# Patient Record
Sex: Male | Born: 1990 | Race: White | Hispanic: No | Marital: Single | Smoking: Never smoker
Health system: Southern US, Community
[De-identification: ages and names within clinical notes are randomized; demographics above are authoritative.]

## PROBLEM LIST (undated history)

## (undated) DIAGNOSIS — I349 Nonrheumatic mitral valve disorder, unspecified: Secondary | ICD-10-CM

## (undated) DIAGNOSIS — H539 Unspecified visual disturbance: Secondary | ICD-10-CM

## (undated) DIAGNOSIS — I341 Nonrheumatic mitral (valve) prolapse: Secondary | ICD-10-CM

## (undated) DIAGNOSIS — F909 Attention-deficit hyperactivity disorder, unspecified type: Secondary | ICD-10-CM

## (undated) DIAGNOSIS — R262 Difficulty in walking, not elsewhere classified: Secondary | ICD-10-CM

## (undated) DIAGNOSIS — F32A Depression, unspecified: Secondary | ICD-10-CM

## (undated) DIAGNOSIS — F419 Anxiety disorder, unspecified: Secondary | ICD-10-CM

## (undated) DIAGNOSIS — R200 Anesthesia of skin: Secondary | ICD-10-CM

## (undated) DIAGNOSIS — R29818 Other symptoms and signs involving the nervous system: Secondary | ICD-10-CM

## (undated) DIAGNOSIS — G459 Transient cerebral ischemic attack, unspecified: Secondary | ICD-10-CM

## (undated) HISTORY — DX: Nonrheumatic mitral valve disorder, unspecified: I34.9

## (undated) HISTORY — DX: Attention-deficit hyperactivity disorder, unspecified type: F90.9

## (undated) HISTORY — PX: TONSILLECTOMY: SUR1361

## (undated) HISTORY — DX: Unspecified visual disturbance: H53.9

## (undated) HISTORY — DX: Difficulty in walking, not elsewhere classified: R26.2

## (undated) HISTORY — DX: Anxiety disorder, unspecified: F41.9

## (undated) HISTORY — DX: Nonrheumatic mitral (valve) prolapse: I34.1

## (undated) HISTORY — DX: Depression, unspecified: F32.A

---

## 2012-11-16 DIAGNOSIS — R531 Weakness: Secondary | ICD-10-CM

## 2012-11-16 HISTORY — DX: Weakness: R53.1

## 2012-11-16 HISTORY — PX: RECONSTRUCTION, FACIAL: SHX5034

## 2017-08-05 ENCOUNTER — Emergency Department
Admission: EM | Admit: 2017-08-05 | Discharge: 2017-08-05 | Disposition: A | Discharge status: Home / Self Care | Payer: 59 | Attending: Emergency Medicine | Admitting: Emergency Medicine

## 2017-08-05 ENCOUNTER — Emergency Department: Payer: 59

## 2017-08-05 DIAGNOSIS — M7989 Other specified soft tissue disorders: Secondary | ICD-10-CM | POA: Insufficient documentation

## 2017-08-05 DIAGNOSIS — S8012XA Contusion of left lower leg, initial encounter: Secondary | ICD-10-CM

## 2017-08-05 NOTE — ED Provider Notes (Signed)
Physician/Midlevel provider first contact with patient: 08/05/17 1528         EMERGENCY DEPARTMENT HISTORY AND PHYSICAL EXAM      Patient Information     Patient Name: Kurt Rogers, Kurt Rogers  Encounter Date:  08/05/2017  Patient DOB:  [birth year]  MRN:  [id number]  Room:  EX25/EX25  Rendering Provider: Sherri Sear, PA-C    History of Presenting Illness     Chief Complaint: left leg swelling  Historian: pt  Onset: sudden  Quality: aching  Location: L shin   Duration: just PTA       HPI Comments:   26 y.o. male o/w healthy p/w left leg swelling beginning this AM. Pt states he stood up to get out of bed and he felt a pop in his L shin. He noticed an area of swelling and pain with movement of his foot. He is visiting from [place] and his PCP's nurse informed him to come to the ED. No numbness or tingling. No medications PTA. No changes in size to the hematoma t/o the day.       PMD: No primary care provider on file.    Past Medical History     History reviewed. No pertinent past medical history.    Past Surgical History     Past Surgical History:   Procedure Laterality Date   . TONSILLECTOMY         Family History     History reviewed. No pertinent family history.    Social History     Social History     Social History   . Marital status: Single     Spouse name: N/A   . Number of children: N/A   . Years of education: N/A     Social History Main Topics   . Smoking status: Never Smoker   . Smokeless tobacco: Never Used   . Alcohol use Yes      Comment: socially   . Drug use: No   . Sexual activity: Not on file     Other Topics Concern   . Not on file     Social History Narrative   . No narrative on file       Allergies     Allergies   Allergen Reactions   . Latex        Home Medications     Prior to Admission medications    Not on File         Review of Systems     Review of Systems   Constitutional: Negative for fever and chills.   HENT: Negative for sore throat.   Cardiovascular: Negative for chest pain.   Respiratory: Negative  for cough and shortness of breath.   Gastrointestinal: Negative for nausea, vomiting, diarrhea.  Musculoskeletal: + L leg pain.   Skin: + swelling .   Neuro: Negative for syncope, paresthesias.   All other systems reviewed and are negative.      Physical Exam     Patient Vitals for the past 24 hrs:   BP Temp Temp src Pulse Resp SpO2   08/05/17 1654 145/70 98.2 F (36.8 C) Oral 65 18 97 %   08/05/17 1519 148/70 98.2 F (36.8 C) Oral 79 18 97 %       Physical Exam   Nursing note and vitals reviewed.   Constitutional: Pt is oriented to person, place, and time. Pt appears well-developed and well-nourished. NAD.  HENT:     Head: Normocephalic and atraumatic.  Eyes: Conjunctivae normal and EOM are normal.   Neck: Normal range of motion.   Cardiovascular: Intact distal pulses. RRR   Pulmonary/Chest: No respiratory distress. O2 sat 97% on RA.   Musculoskeletal: LEFT LE: Left anterior shin with swelling and tender hematoma with foot in dorsiflexion. Indentation over anterior shin, just inferior to the hematoma when foot in plantarflexion with tenderness just lateral to this. Neg Homan's sign.   Neurological: Pt is alert and oriented to person, place, and time. GCS 15.   Skin: Skin is warm and dry.   Psychiatric: Normal mood and affect. Behavior is normal.       Orders Placed During This Encounter     Orders Placed This Encounter   Procedures   . XR Tibia Fibula Left AP And Lateral   . Apply ace wrap       ED Medications Administered     ED Medication Orders     None          Diagnostic Study Results and Data Review     The results of the diagnostic studies below were reviewed by the ED provider:    Labs  Results     ** No results found for the last 24 hours. **          Radiologic Studies  Radiology Results (24 Hour)     Procedure Component Value Units Date/Time    XR Tibia Fibula Left AP And Lateral [[id number]] Collected:  08/05/17 1627    Order Status:  Completed Updated:  08/05/17 1634    Narrative:       XR TIBIA  FIBULA LEFT AP AND LATERAL    CLINICAL INDICATION:   pain swelling hx of prior fibula fx    TECHNIQUE: Tibia fibula left 2 views 2 images    COMPARISON:    None available    FINDINGS:   Bone mineralization appears within normal limits. There is a  healed proximal fibula fracture. No acute fracture or subluxation is  identified. No gross abnormality is seen in the soft tissues.      Impression:           1.  No acute osseous abnormality is identified.    Tana Felts, MD   08/05/2017 4:30 PM          Abnormal results/incidental findings discussed with pt and/or family: yes    Monitors, EKG, Procedures, Critical Care, and Splints       MDM and Clinical Notes     Working Differential (not completely inclusive): sprain, hematoma, gastrocnemius strain, DVT, etc    Nursing records reviewed and agree: Yes    Clinical Notes & Decision Making:     26 yo male with swelling to the anterior shin after putting weight on the leg. NV intact. No expanding hematoma. Prior fibula fx years ago, but no significant injury today. X-Ray neg. Placed in ace wrap and advised f/u with ortho upon return to [place].   D/w Dr. Glendell Docker, who agrees with the plan of care.   Pt understands and agrees with the plan of care. Given return precautions.         Return Precautions    The patient is aware that this evaluation is only a screening for emergent conditions related to his or her symptoms and presentation.   I discussed the need for prompt follow-up.  Patient demonstrates verbal understanding.  Patient advised to return to the ED for any worsening symptoms, uncontrolled pain if  applicable, worsening fevers, or any changes in their condition prompting concern and need for repeat evaluationand/or additional management.        Prescriptions       New Prescriptions    No medications on file       Diagnosis and Disposition     Clinical Impression:  1. Leg swelling    2. Hematoma of leg, left, initial encounter      Final diagnoses:   Leg swelling    Hematoma of leg, left, initial encounter       Disposition:  ED Disposition     ED Disposition Condition Date/Time Comment    Discharge  Thu Aug 05, 2017  4:48 PM Kurt Rogers discharge to home/self care.    Condition at disposition: Stable                Rendering Provider: Sherri Sear, PA-C    Attending's signature signifies review of the provider note and clinical impression.    This note was generated by the [facility] EMR system/Dragon speech recognition and may contain inherent errors or omissions not intended by the user. Grammatical errors, random word insertions, deletions, pronoun errors and incomplete sentences are occasional consequences of this technology due to software limitations. Not all errors are caught or corrected. If there are questions or concerns about the content of this note or information contained within the body of this dictation they should be addressed directly with the author for clarification.       Sherri Sear [place], [place]  08/05/17 1705
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
## 2017-08-05 NOTE — Discharge Instructions (Signed)
Hematoma    You have been diagnosed with a hematoma.    A hematoma is a collection of blood under the skin usually caused by injury to the area. In other words, it is a very large bruise.    Put ice on the area 15 minutes out of every hour to help with swelling and pain. Putting ice on the affected area can reduce pain and swelling. Put some ice cubes in a re-sealable (Ziploc) bag. Add some water. Put a thin washcloth between the bag and the skin. Apply the ice bag to the area for at least 20 minutes. Do this at least 4 times per day. Longer times and more often are OK. NEVER APPLY ICE DIRECTLY TO THE SKIN! If your injury is on your hand or foot, lift it above the level of your heart to help with swelling.    YOU SHOULD SEEK MEDICAL ATTENTION IMMEDIATELY, EITHER HERE OR AT THE NEAREST EMERGENCY DEPARTMENT, IF ANY OF THE FOLLOWING OCCURS:   The hematoma keeps getting bigger.   Fever (temperature higher than 100.33F / 38C) or shaking chills.   Redness that surrounds or spreads outward from the area.   Breakdown of the skin in the area affected.   Very bad-smelling drainage from the injured area.     Dear Kurt Rogers,    You were seen today by Sherri Sear, PA-C. Thank you for choosing the [facility] Emergency Department for your healthcare needs.  We hope your visit today was EXCELLENT.    Return immediately to the emergency room if you have any worsening symptoms! Otherwise, see general doctor in the next 2 days, or return to the emergency department in 2 days if you have any trouble getting an appointment.    Please take any medications prescribed as directed.     If you have any questions or concerns, I am available at [phone]. Please do not hesitate to contact me if I can be of assistance.     Below is some information and resources that our patients often find helpful.    Sincerely,    Sherri Sear, Physician  Assistant    ________________________________________________________________  Thank you for choosing [facility] for your emergency care needs.  We strive to provide EXCELLENT care to you and your family.      If you do not continue to improve or your condition worsens, please contact your doctor or return immediately to the Emergency Department.    DOCTOR REFERRALS  Call [phone] (available 24 hours a day, 7 days a week) if you need any further referrals and we can help you find a primary care doctor or specialist.  Also, available online at:  [email or web address]    YOUR CONTACT INFORMATION  Before leaving please check with registration to make sure we have an up-to-date contact number.  You can call registration at [phone] to update your information.  For questions about your hospital bill, please call [phone].  For questions about your Emergency Dept Physician bill please call [phone].      FREE HEALTH SERVICES  If you need help with health or social services, please call 2-1-1 for a free referral to resources in your area.  2-1-1 is a free service connecting people with information on health insurance, free clinics, pregnancy, mental health, dental care, food assistance, housing, and substance abuse counseling.  Also, available online at:  http://www.[facility].org    MEDICAL RECORDS AND TESTS  Certain laboratory test results  do not come back the same day, for example urine cultures.  We will contact you if other important findings are noted. Radiology films are often reviewed again to ensure accuracy.  If there is any discrepancy, we will notify you.    Please call [phone] to pick up a complimentary CD of any radiology studies performed.  If you or your doctor would like to request a copy of your medical records, please call [phone].      ORTHOPEDIC INJURY   Please know that significant injuries can exist even when an initial x-ray is  read as normal or negative.  This can occur because some fractures (broken bones) are not initially visible on x-rays.  For this reason, close outpatient follow-up with your primary care doctor or bone specialist (orthopedist) is required.    MEDICATIONS AND FOLLOWUP  Please be aware that some prescription medications can cause drowsiness.  Use caution when driving or operating machinery.    The examination and treatment you have received in our Emergency Department is provided on an emergency basis, and is not intended to be a substitute for your primary care physician.  It is important that your doctor checks you again and that you report any new or remaining problems at that time.      24 HOUR PHARMACIES  [facility] - [address] [place], [place] [address] (1.4 miles, 7 minutes)  [facility] - [address], [place], [place] [address] (6.5 miles, 13 minutes)  Handout with directions available on request.

## 2021-07-06 ENCOUNTER — Emergency Department
Admission: EM | Admit: 2021-07-06 | Discharge: 2021-07-06 | Disposition: A | Discharge status: Home / Self Care | Payer: Commercial Managed Care - POS | Attending: Student in an Organized Health Care Education/Training Program | Admitting: Student in an Organized Health Care Education/Training Program

## 2021-07-06 ENCOUNTER — Emergency Department: Payer: Commercial Managed Care - POS

## 2021-07-06 DIAGNOSIS — H538 Other visual disturbances: Secondary | ICD-10-CM | POA: Insufficient documentation

## 2021-07-06 LAB — CBC AND DIFFERENTIAL
Absolute NRBC: 0 10*3/uL (ref 0.00–0.00)
Basophils Absolute Automated: 0.04 10*3/uL (ref 0.00–0.08)
Basophils Automated: 0.5 %
Eosinophils Absolute Automated: 0.11 10*3/uL (ref 0.00–0.44)
Eosinophils Automated: 1.4 %
Hematocrit: 42.9 % (ref 37.6–49.6)
Hgb: 15.1 g/dL (ref 12.5–17.1)
Immature Granulocytes Absolute: 0.01 10*3/uL (ref 0.00–0.07)
Immature Granulocytes: 0.1 %
Lymphocytes Absolute Automated: 3.2 10*3/uL (ref 0.42–3.22)
Lymphocytes Automated: 40.2 %
MCH: 27.4 pg (ref 25.1–33.5)
MCHC: 35.2 g/dL (ref 31.5–35.8)
MCV: 77.9 fL — ABNORMAL LOW (ref 78.0–96.0)
MPV: 9.6 fL (ref 8.9–12.5)
Monocytes Absolute Automated: 0.49 10*3/uL (ref 0.21–0.85)
Monocytes: 6.2 %
Neutrophils Absolute: 4.11 10*3/uL (ref 1.10–6.33)
Neutrophils: 51.6 %
Nucleated RBC: 0 /100 WBC (ref 0.0–0.0)
Platelets: 339 10*3/uL (ref 142–346)
RBC: 5.51 10*6/uL (ref 4.20–5.90)
RDW: 12 % (ref 11–15)
WBC: 7.96 10*3/uL (ref 3.10–9.50)

## 2021-07-06 LAB — COMPREHENSIVE METABOLIC PANEL
ALT: 34 U/L (ref 0–55)
AST (SGOT): 17 U/L (ref 5–34)
Albumin/Globulin Ratio: 1.8 (ref 0.9–2.2)
Albumin: 4.5 g/dL (ref 3.5–5.0)
Alkaline Phosphatase: 78 U/L (ref 37–117)
Anion Gap: 9 (ref 5.0–15.0)
BUN: 12 mg/dL (ref 9.0–28.0)
Bilirubin, Total: 0.4 mg/dL (ref 0.2–1.2)
CO2: 27 mEq/L (ref 22–29)
Calcium: 9.9 mg/dL (ref 8.5–10.5)
Chloride: 107 mEq/L (ref 100–111)
Creatinine: 0.8 mg/dL (ref 0.7–1.3)
Globulin: 2.5 g/dL (ref 2.0–3.6)
Glucose: 92 mg/dL (ref 70–100)
Potassium: 4.3 mEq/L (ref 3.5–5.1)
Protein, Total: 7 g/dL (ref 6.0–8.3)
Sodium: 143 mEq/L (ref 136–145)

## 2021-07-06 LAB — ECG 12-LEAD
Atrial Rate: 67 {beats}/min
IHS MUSE NARRATIVE AND IMPRESSION: NORMAL
P Axis: 52 degrees
P-R Interval: 160 ms
Q-T Interval: 370 ms
QRS Duration: 88 ms
QTC Calculation (Bezet): 390 ms
R Axis: 120 degrees
T Axis: -3 degrees
Ventricular Rate: 67 {beats}/min

## 2021-07-06 LAB — TROPONIN I: Troponin I: <0.01 ng/mL (ref 0.00–0.05)

## 2021-07-06 LAB — GLUCOSE WHOLE BLOOD - POCT: Whole Blood Glucose POCT: 88 mg/dL (ref 70–100)

## 2021-07-06 LAB — GFR: EGFR: >60

## 2021-07-06 NOTE — ED Triage Notes (Signed)
PT c/o blurred vision. Per PT, he woke up this morning with blood on his pillow - unknown source of blood and blurry vision more than normal. PT reports he was seen at [facility] for r/o stroke Tuesday and they sent him home; PT states he has had numbness and tingling on left arms and leg since Tuesday.    BP 140/79   Pulse 92   Temp 98.2 F (36.8 C) (Oral)   Resp 16   Ht 5\' 10"  (1.778 m)   Wt 95.1 kg   SpO2 98%   BMI 30.07 kg/m

## 2021-07-06 NOTE — ED Notes (Signed)
RN first contact with pt - pt is showing no signs of acute distress

## 2021-07-06 NOTE — ED Provider Notes (Signed)
EMERGENCY DEPARTMENT HISTORY AND PHYSICAL EXAM     None        Date: 07/06/2021  Patient Name: Kurt Rogers    This note was generated by the [employer] EMR system/ Dragon speech recognition and may contain inherent errors or omissions not intended by the user. Grammatical errors, random word insertions, deletions and pronoun errors  are occasional consequences of this technology due to software limitations. Not all errors are caught or corrected. If there are questions or concerns about the content of this note or information contained within the body of this dictation they should be addressed directly with the author for clarification.    History of Presenting Illness     Chief Complaint   Patient presents with    Blurred Vision       History Provided By: Patient    Additional History: Kurt Rogers is a 30 y.o. male presenting with blurry vision and waking up to small amount of dried blood on his pillow this morning.  He reports he was seen at outside hospital 4 days ago for numbness and tingling of his left side; received a full stroke work-up which was negative.  However he claims that he left his prescription glasses at the hospital and since then he has been having worsening blurry vision.  Reports he has ordered a new set of prescription glasses.  States his main concern is waking up with a dry blood on his pillow and not knowing where it came from.  Denies any wounds, nosebleeds, new numbness, weakness, headache, chest pain, dizziness or other acute neurological complaints.    I reviewed patient's last ED visit, clinic visit or admission/discharge summary, as well as associated recent EKGs, lab or imaging results, if applicable.     PCP: No primary care provider on file.    Past History   Past Medical History:  History reviewed. No pertinent past medical history.    Past Surgical History:  Past Surgical History:   Procedure Laterality Date    TONSILLECTOMY         Family History:  No family history on  file.    Social History:  Social History     Tobacco Use    Smoking status: Never    Smokeless tobacco: Never   Substance Use Topics    Alcohol use: Yes     Comment: socially    Drug use: No       Allergies:  Allergies   Allergen Reactions    Latex        Review of Systems     Review of Systems   Constitutional:  Negative for fever.   HENT:  Negative for drooling and nosebleeds.    Eyes:  Positive for visual disturbance.   Respiratory:  Negative for shortness of breath.    Cardiovascular:  Negative for chest pain.   Gastrointestinal:  Negative for abdominal pain.   Genitourinary:  Negative for dysuria.   Musculoskeletal:  Negative for back pain.   Skin:  Negative for rash and wound.   Neurological:  Negative for dizziness, weakness and headaches.     Other than pertinent positives as above, a complete ROS was reviewed and negative.    Physical Exam   BP 140/79   Pulse 92   Temp 98.2 F (36.8 C) (Oral)   Resp 16   Ht 5\' 10"  (1.778 m)   Wt 95.1 kg   SpO2 98%   BMI 30.07 kg/m  Physical Exam  Vitals reviewed.   Constitutional:       Appearance: He is not toxic-appearing.   HENT:      Head: Normocephalic and atraumatic.      Right Ear: Tympanic membrane and ear canal normal.      Left Ear: Tympanic membrane and ear canal normal.      Nose: Nose normal.   Eyes:      General:         Right eye: No discharge.         Left eye: No discharge.      Extraocular Movements: Extraocular movements intact.      Conjunctiva/sclera: Conjunctivae normal.      Pupils: Pupils are equal, round, and reactive to light.   Cardiovascular:      Rate and Rhythm: Normal rate.      Pulses: Normal pulses.   Pulmonary:      Effort: Pulmonary effort is normal. No respiratory distress.   Abdominal:      General: There is no distension.   Musculoskeletal:         General: No deformity. Normal range of motion.      Cervical back: Normal range of motion and neck supple.   Skin:     General: Skin is warm and dry.   Neurological:      Mental  Status: He is alert. Mental status is at baseline.   Psychiatric:         Mood and Affect: Mood normal.         Diagnostic Study Results     Labs -  Reviewed, if relevant to the case.    Radiologic Studies -   Reviewed, if relevant to the case.      Medical Decision Making   I am the first provider for this patient.    I reviewed the vital signs, available nursing notes, past medical history, past surgical history, family history and social history.    Vital Signs: Reviewed the patient's vital signs during ED stay.    I reviewed pt's pulse oxymetry and cardiac monitor values, as relevant to the case.    Pulse Oximetry Analysis:  98% on RA - Normal    Cardiac Monitor:  Rhythm:  Normal Sinus, Rate:  Normal, Ectopy:  None    Personal Protective Equipment (PPE)  Gloves, N95 and surgical mask.    Record Review: The following, if applicable to the case, were reviewed and noted:    Old medical records.  Nursing notes.  Outside records.     EKG:  Interpreted by this Emergency Physician.  EKG: NSR 67, no STE's or STD's, non-specific twave pattern, no STEMI, QTC 390    Provider Notes/MDM:   Patient is a 30 y.o. male who presented to the ED with blurry vision in the setting of losing his prescription eyeglasses as well as concern for dry blood noted on his pillow upon waking up this morning.  On presentation, pt is nontoxic appearing, HDS, VSS.  There is no obvious external signs of trauma or wounds to his head and neck.  TMs are clear, nares without dried blood.  Unsure where the dried blood came from; could ppossibly be from a scalp wound or scratching in the middle of his sleep.  Blood from triage shows a normal H&H and other lab work is unremarkable.  His blurry vision, likely due to the fact that he has not been wearing his prescription glasses.  Patient reports he  has called his optometrist and has ordered a new prescription glasses.  He also states that he has low concern about the blurry vision that is not his main  complaint today.  I reassured patient that work-up and physical exam is unremarkable.  He is stable for discharge with PCP follow-up for his neurology follow-up and can return for any concerning symptoms.     ED Course:         Diagnosis     Clinical Impression:   1. Blurry vision, bilateral        Disposition:   ED Disposition       ED Disposition   Discharge    Condition   --    Date/Time   Sun Jul 06, 2021  2:06 PM    Comment   Kurt Rogers discharge to home/self care.    Condition at disposition: Stable                 The above diagnostic process was due to medical necessity based on risk stratification of potential harm of patient's presenting complaint.     Attestations: This note is prepared by Aline Brochure, MD. I am the first provider for this patient.        Verna Czech, MD  07/06/21 2242

## 2021-07-06 NOTE — Discharge Instructions (Addendum)
Follow up with your optometrist for your new glasses prescription. Do not drive without your prescription glasses.    Return to the Emergency Department immediately if you experience worsening symptoms, failure to improve, or have any questions, or new or concerning symptoms such as chest pain, shortness of breath, fever (Temp > 100.5), persistent vomiting, infection, weakness, numbness, headache, facial droop, or slurred speech. Otherwise follow-up with your primary care doctor.    No future appointments.    Call your primary care physician to make an appointment as a follow up to your ED visit.     Take all your medications exactly as prescribed. If you are taking or were prescribed a sedating medication such as valium, norco, percocet or other opioids, do not drive or operate heavy machinery while taking. Prolonged or overuse of drugs prescribed for pain or sedation may lead to addiction, dependence, as well as liver failure.

## 2021-12-02 ENCOUNTER — Observation Stay
Admission: EM | Admit: 2021-12-02 | Discharge: 2021-12-04 | Disposition: A | Discharge status: Home / Self Care | Payer: Self-pay | Attending: Internal Medicine | Admitting: Internal Medicine

## 2021-12-02 ENCOUNTER — Emergency Department: Payer: Self-pay

## 2021-12-02 DIAGNOSIS — R0789 Other chest pain: Principal | ICD-10-CM | POA: Insufficient documentation

## 2021-12-02 DIAGNOSIS — R531 Weakness: Secondary | ICD-10-CM | POA: Insufficient documentation

## 2021-12-02 DIAGNOSIS — I498 Other specified cardiac arrhythmias: Secondary | ICD-10-CM | POA: Insufficient documentation

## 2021-12-02 DIAGNOSIS — F909 Attention-deficit hyperactivity disorder, unspecified type: Secondary | ICD-10-CM | POA: Insufficient documentation

## 2021-12-02 DIAGNOSIS — E669 Obesity, unspecified: Secondary | ICD-10-CM | POA: Insufficient documentation

## 2021-12-02 DIAGNOSIS — Z6831 Body mass index (BMI) 31.0-31.9, adult: Secondary | ICD-10-CM | POA: Insufficient documentation

## 2021-12-02 DIAGNOSIS — R079 Chest pain, unspecified: Secondary | ICD-10-CM

## 2021-12-02 DIAGNOSIS — F449 Dissociative and conversion disorder, unspecified: Secondary | ICD-10-CM | POA: Insufficient documentation

## 2021-12-02 DIAGNOSIS — Z7982 Long term (current) use of aspirin: Secondary | ICD-10-CM | POA: Insufficient documentation

## 2021-12-02 DIAGNOSIS — R55 Syncope and collapse: Secondary | ICD-10-CM | POA: Insufficient documentation

## 2021-12-02 DIAGNOSIS — Z20822 Contact with and (suspected) exposure to covid-19: Secondary | ICD-10-CM | POA: Insufficient documentation

## 2021-12-02 DIAGNOSIS — Z79899 Other long term (current) drug therapy: Secondary | ICD-10-CM | POA: Insufficient documentation

## 2021-12-02 HISTORY — DX: Other symptoms and signs involving the nervous system: R29.818

## 2021-12-02 HISTORY — DX: Anesthesia of skin: R20.0

## 2021-12-02 HISTORY — DX: Transient cerebral ischemic attack, unspecified: G45.9

## 2021-12-02 LAB — CBC AND DIFFERENTIAL
Absolute NRBC: 0 10*3/uL (ref 0.00–0.00)
Basophils Absolute Automated: 0.07 10*3/uL (ref 0.00–0.08)
Basophils Automated: 0.8 %
Eosinophils Absolute Automated: 0.09 10*3/uL (ref 0.00–0.44)
Eosinophils Automated: 1.1 %
Hematocrit: 45.4 % (ref 37.6–49.6)
Hgb: 16 g/dL (ref 12.5–17.1)
Immature Granulocytes Absolute: 0.01 10*3/uL (ref 0.00–0.07)
Immature Granulocytes: 0.1 %
Instrument Absolute Neutrophil Count: 3.87 10*3/uL (ref 1.10–6.33)
Lymphocytes Absolute Automated: 3.83 10*3/uL — ABNORMAL HIGH (ref 0.42–3.22)
Lymphocytes Automated: 45.4 %
MCH: 27.3 pg (ref 25.1–33.5)
MCHC: 35.2 g/dL (ref 31.5–35.8)
MCV: 77.5 fL — ABNORMAL LOW (ref 78.0–96.0)
MPV: 9.4 fL (ref 8.9–12.5)
Monocytes Absolute Automated: 0.57 10*3/uL (ref 0.21–0.85)
Monocytes: 6.8 %
Neutrophils Absolute: 3.87 10*3/uL (ref 1.10–6.33)
Neutrophils: 45.8 %
Nucleated RBC: 0 /100 WBC (ref 0.0–0.0)
Platelets: 339 10*3/uL (ref 142–346)
RBC: 5.86 10*6/uL (ref 4.20–5.90)
RDW: 12 % (ref 11–15)
WBC: 8.44 10*3/uL (ref 3.10–9.50)

## 2021-12-02 LAB — COMPREHENSIVE METABOLIC PANEL
ALT: 26 U/L (ref 0–55)
AST (SGOT): 20 U/L (ref 5–41)
Albumin/Globulin Ratio: 1.8 (ref 0.9–2.2)
Albumin: 4.9 g/dL (ref 3.5–5.0)
Alkaline Phosphatase: 75 U/L (ref 37–117)
Anion Gap: 13 (ref 5.0–15.0)
BUN: 13 mg/dL (ref 9.0–28.0)
Bilirubin, Total: 0.5 mg/dL (ref 0.2–1.2)
CO2: 24 mEq/L (ref 17–29)
Calcium: 9.7 mg/dL (ref 8.5–10.5)
Chloride: 105 mEq/L (ref 99–111)
Creatinine: 1 mg/dL (ref 0.5–1.5)
Globulin: 2.7 g/dL (ref 2.0–3.6)
Glucose: 92 mg/dL (ref 70–100)
Potassium: 4.3 mEq/L (ref 3.5–5.3)
Protein, Total: 7.6 g/dL (ref 6.0–8.3)
Sodium: 142 mEq/L (ref 135–145)

## 2021-12-02 LAB — COVID-19 (SARS-COV-2) & INFLUENZA  A/B, NAA (ROCHE LIAT)
Influenza A: NOT DETECTED
Influenza B: NOT DETECTED
SARS CoV 2 Overall Result: NOT DETECTED

## 2021-12-02 LAB — GLUCOSE WHOLE BLOOD - POCT: Whole Blood Glucose POCT: 92 mg/dL (ref 70–100)

## 2021-12-02 LAB — GFR: EGFR: >60

## 2021-12-02 LAB — HIGH SENSITIVITY TROPONIN-I: hs Troponin-I: <2.7 ng/L

## 2021-12-02 LAB — HIGH SENSITIVITY TROPONIN-I WITH DELTA
hs Troponin-I Delta: UNDETERMINED ng/L
hs Troponin-I: <2.7 ng/L

## 2021-12-02 MED ORDER — HYDRALAZINE HCL 20 MG/ML IJ SOLN
10.0000 mg | INTRAMUSCULAR | Status: DC | PRN
Start: 2021-12-02 — End: 2021-12-04

## 2021-12-02 MED ORDER — AMPHETAMINE-DEXTROAMPHETAMINE 5 MG PO TABS
10.0000 mg | ORAL_TABLET | Freq: Two times a day (BID) | ORAL | Status: DC
Start: 2021-12-03 — End: 2021-12-04
  Administered 2021-12-03 – 2021-12-04 (×3): 10 mg via ORAL
  Filled 2021-12-02 (×4): qty 2

## 2021-12-02 MED ORDER — HEPARIN SODIUM (PORCINE) 5000 UNIT/ML IJ SOLN
5000.0000 [IU] | Freq: Two times a day (BID) | INTRAMUSCULAR | Status: DC
Start: 2021-12-02 — End: 2021-12-04
  Administered 2021-12-02 – 2021-12-04 (×4): 5000 [IU] via SUBCUTANEOUS
  Filled 2021-12-02 (×4): qty 1

## 2021-12-02 MED ORDER — ASPIRIN 81 MG PO CHEW
81.0000 mg | CHEWABLE_TABLET | Freq: Every day | ORAL | Status: DC
Start: 2021-12-02 — End: 2021-12-04
  Administered 2021-12-03 – 2021-12-04 (×2): 81 mg via ORAL
  Filled 2021-12-02 (×2): qty 1

## 2021-12-02 MED ORDER — ATORVASTATIN CALCIUM 10 MG PO TABS
10.0000 mg | ORAL_TABLET | Freq: Every evening | ORAL | Status: DC
Start: 2021-12-02 — End: 2021-12-04
  Administered 2021-12-02 – 2021-12-03 (×2): 10 mg via ORAL
  Filled 2021-12-02 (×2): qty 1

## 2021-12-02 MED ORDER — ASPIRIN 81 MG PO CHEW
324.0000 mg | CHEWABLE_TABLET | Freq: Once | ORAL | Status: AC
Start: 2021-12-02 — End: 2021-12-02
  Administered 2021-12-02: 324 mg via ORAL
  Filled 2021-12-02: qty 4

## 2021-12-02 MED ORDER — IOHEXOL 350 MG/ML IV SOLN
50.0000 mL | Freq: Once | INTRAVENOUS | Status: AC | PRN
Start: 2021-12-02 — End: 2021-12-02
  Administered 2021-12-02: 80 mL via INTRAVENOUS

## 2021-12-02 MED ORDER — IOHEXOL 350 MG/ML IV SOLN
45.0000 mL | Freq: Once | INTRAVENOUS | Status: AC | PRN
Start: 2021-12-02 — End: 2021-12-02
  Administered 2021-12-02: 45 mL via INTRAVENOUS

## 2021-12-02 MED ORDER — LABETALOL HCL 5 MG/ML IV SOLN (WRAP)
10.0000 mg | INTRAVENOUS | Status: DC | PRN
Start: 2021-12-02 — End: 2021-12-04

## 2021-12-02 NOTE — Consults (Signed)
[facility] Consultation Note                                       Date Time: 12/02/21 2:53 PM  Patient Name: Kurt Rogers,Kurt Rogers  Requesting Physician: Maryella ShiversKumar, Vinod S, MD  Date of Admission: 12/02/2021    CC / Reason for Consultation: stroke alert          Assessment:   31 y.o. M w/ hx of ADHD who presents with chest pain and worsening L sided weakness after syncopal event. LKW 0830 am.  NIHSS 3. Exam w/ LUE and LLE weakness and decreased sensation.  CT head nothing acute, CTA head/neck no LVO, CTP wnl. Not a tPA candidate, > 4.5 hrs since LKW and symptoms appears chronic. No indication for MET.      Etiology of chronic L sided weakness unclear. Defer MRI at this time as pt has had multiple negative MRIs over the past year for this ongoing L sided weakness. Recommend follow up with outpatient Neurologist.     Plan:   -Defer MRI brain   -Normotensive BP goal  -[facility]  -Okay to discharge from neuro standpoint   -Chest pain workup per ED     HPI   Kurt Rogers is a 31 y.o. male w/ hx of ADHD who presents with chest pain and worsening L sided weakness after a syncopal event.  States at 0830 this morning he was on his computer when he then woke up on the floor at 1300 pm w/ chest pain and worsening L sided weakness. He has no recollection of falling on the ground. He called his friend who drove him to the ED.  Denies headache, vision changes, speech changes, dizziness, nausea, fevers, SOB.  Denies any recent stressors in life.      Pt reports hx of L sided weakness since 2021. He has [facility] [place], [facility]. 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 MRI C spines (02/22, 04/22, 08/22) all unremarkable studies.  LP completed in 04/22 w/ negative IgG and OCB. 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  Currently follows outpatient with [facility] Neurologist, Dr. Florestine AversAbu-Rub.     Past Medical Hx   No past medical history on file.     Past Surgical Hx:     Past Surgical History:   Procedure Laterality Date    TONSILLECTOMY          Family Medical History:    No family history on file.    Social Hx     Social History     Socioeconomic History    Marital status: Single   Tobacco Use    Smoking status: Never    Smokeless tobacco: Never   Substance and Sexual Activity    Alcohol use: Yes     Comment: socially    Drug use: No       Meds     Home :   Prior to Admission medications  Medication Sig Start Date End Date Taking? Authorizing Provider   amphetamine-dextroamphetamine (Adderall XR) 20 MG 24 hr capsule Adderall XR 20 mg capsule,extended release   TAKE 1 CAPSULE BY MOUTH EVERY DAY IN THE MORNING    [provider]      Inpatient :       Allergies    Latex      Review of Systems   Pertinent items are noted in HPI.  All other systems were reviewed and are negative except for that mentioned in the HPI    Physical Exam:   Temp:  [97.8 F (36.6 C)] 97.8 F (36.6 C)  Heart Rate:  [63] 63  Resp Rate:  [18] 18  BP: (137)/(89) 137/89     Vital Signs:  Reviewed    General: Well developed and well nourished. No acute distress. Cooperative with the exam  Neck: Symmetric, no deformities  Resp: No audible wheezing, normal work of breathing  Skin: Intact, extremities normal in color    Mental Status: The patient is awake, alert and oriented to person, place, and time. Speech fluent without word finding difficulties. Naming intact, repetition intact. Follows commands.     Cranial nerves:   -CN II: Visual fields full to bedside confrontation   -CN III, IV, VI: Pupils equal, round,  and reactive to light; extraocular movements intact; no ptosis  -CN V: Facial sensation intact in V1 through V3 distributions   -CN VII: Face symmetric   -CN VIII: Hearing intact to conversational speech   -CN IX, X: Palate elevates symmetrically; normal phonation   -CN XII: Tongue protrudes midline    Motor: Muscle tone normal without spasticity or flaccidity.  UEs:   Deltoid Bicep Tricep Grip   Right 5 5 5 5    Left 4 4 4 4      LEs   HF KF KE DF   Right 5 5 5 5    Left 4 4 4 4      Sensory: Light touch intact diminished to LUE/LLE. No extinction  Coordination: FTN intact, No tremors  Gait: Station normal, gait stable   Tandem walk intact.   Romberg negative.    Results       Procedure Component Value Units Date/Time    Glucose Whole Blood - POCT Collected: 12/02/21 1429     Updated: 12/02/21 1431     Whole Blood Glucose POCT 92 mg/dL           Rads:   No results found for this or any previous visit.    Signed by:  [[id number]], NP-C  Nurse Practitioner  [facility]  Spectra: IAH [phone]     *Final recommendations per attending neurologist who will also see patient today and record notes.

## 2021-12-02 NOTE — ED Notes (Signed)
[facility] EMERGENCY DEPARTMENT  ED NURSING NOTE FOR THE RECEIVING INPATIENT NURSE   ED NURSE Eloise Levels [phone]   ED CHARGE RN Jingle   ADMISSION INFORMATION   Kurt Rogers is a 31 y.o. male admitted with an ED diagnosis of:    1. Left-sided weakness    2. Chest pain, unspecified type         Isolation: None   Allergies: Latex   Holding Orders confirmed? No   Belongings Documented? Yes   Home medications sent to pharmacy confirmed? N/A   NURSING CARE   Patient Comes From:   Mental Status: Home Independent  alert and oriented   ADL: Independent with all ADLs   Ambulation: no difficulty   Pertinent Information  and Safety Concerns:       left weakness, hx of same, has had multiple MRI's within last year, no definitive diagnosis. Mild weakness left side grasp.      CT / NIH   CT Head ordered on this patient?  Yes   NIH/Dysphagia assessment done prior to admission? Yes   VITAL SIGNS (at the time of this note)      Vitals:    12/02/21 1832   BP: 110/54   Pulse: (!) 57   Resp: 18   Temp: 97.8 F (36.6 C)   SpO2: 97%

## 2021-12-02 NOTE — ED Provider Notes (Signed)
EMERGENCY DEPARTMENT HISTORY AND PHYSICAL EXAM     None        Date: 12/02/2021  Patient Name: Kurt Rogers    History of Presenting Illness     Chief Complaint   Patient presents with    Chest Pain    Syncope       History Provided By: patient    History: Kurt Rogers is a 31 y.o. male presenting to the ED with moderate severity, sudden onset, generalized episode of syncope that occurred today morning while patient was at home.  Patient states he woke up at around 1:30 PM today with midline pressure nonradiating chest pain that is spontaneously improving and weakness and numbness in the left arm.  No clear exacerbating relieving factors symptoms.  No illicit drug use.  Patient has been admitted to the hospital with left-sided weakness multiple times in 2022 with unremarkable MRI.    PCP: Gracelyn Nurse, MD  SPECIALISTS:    Current Facility-Administered Medications   Medication Dose Route Frequency Provider Last Rate Last Admin    [START ON 12/03/2021] amphetamine-dextroamphetamine (ADDERALL) tablet 10 mg  10 mg Oral 2XDAY at 0800 & 1200 Iris Pert, MD        aspirin chewable tablet 81 mg  81 mg Oral Daily Iris Pert, MD        atorvastatin (LIPITOR) tablet 10 mg  10 mg Oral QHS Iris Pert, MD   10 mg at 12/02/21 2147    heparin (porcine) injection 5,000 Units  5,000 Units Subcutaneous Q12H [facility] Iris Pert, MD   5,000 Units at 12/02/21 2148    hydrALAZINE (APRESOLINE) injection 10 mg  10 mg Intravenous Q3H PRN Iris Pert, MD        labetalol (NORMODYNE,TRANDATE) injection 10 mg  10 mg Intravenous Q15 Min PRN Iris Pert, MD           Past History     Past Medical History:  Past Medical History:   Diagnosis Date    Neurological abnormality     Numbness     TIA (transient ischemic attack)        Past Surgical History:  Past Surgical History:   Procedure Laterality Date    TONSILLECTOMY         Family History:  History reviewed. No pertinent family  history.    Social History:  Social History     Tobacco Use    Smoking status: Never    Smokeless tobacco: Never   Substance Use Topics    Alcohol use: Yes     Comment: socially    Drug use: No       Allergies:  Allergies   Allergen Reactions    Latex        Review of Systems     Review of Systems: All other systems reviewed and negative.         Physical Exam   BP 112/71   Pulse (!) 52   Temp 97.8 F (36.6 C) (Oral)   Resp 18   Ht 5\' 10"  (1.778 m)   Wt 99.7 kg   SpO2 96%   BMI 31.54 kg/m     Constitutional: Vital signs reviewed. Well appearing. No distress.  Head: Normocephalic, atraumatic  Eyes: Conjunctiva and sclera are normal.  No injection or discharge.  Ears, Nose, Throat:  Normal external examination of the nose and ears. No throat or oropharyngeal swelling or erythema. Midline uvula. Mucous membranes  moist.  Neck: Normal range of motion. Supple, no meningeal signs. Trachea midline. No stridor. No JVD  Respiratory/Chest: Clear to auscultation. No respiratory distress.   Cardiovascular: Regular rate and rhythm. No murmurs.  Abdomen:  Bowel sounds intact. No rebound or guarding. Soft.  Non-tender.  Back: No CVA tenderness to percussion. No focal tenderness.  Upper Extremity:  No edema. No cyanosis. Bilateral radial pulses intact and equal.   Lower Extremity:  No edema. No cyanosis. Bilateral calves symmetrical and non-tender. Bilateral femoral, DP, PT pulses intact and equal.  Skin: Warm and dry. No rash.  Neuro: A&Ox3. CNII -XII intact to testing.  Strength in the left arm and left leg mildly decreased compared to strength in the right arm and right leg.  Sensation to sharp touch mildly decreased in the left arm and left leg compared to right arm and right leg.  Coordination intact to finger to nose testing and heel to shin rubbing.    Psychiatric: Normal affect.  Normal insight.      Diagnostic Study Results     Labs -     Results       Procedure Component Value Units Date/Time    High Sensitivity  Troponin-I at 2 hrs with calculated Delta [[id number]] Collected: 12/02/21 1709    Specimen: Blood Updated: 12/02/21 1744     hs Troponin-I <2.7 ng/L      hs Troponin-I Delta Unable toCalc. ng/L     COVID-19 (SARS-CoV-2) and Influenza A/B, NAA (Liat Rapid)- Admission [[id number]] Collected: 12/02/21 1502    Specimen: Culturette from Nasopharyngeal Updated: 12/02/21 1545     Purpose of COVID testing Diagnostic -PUI     SARS-CoV-2 Specimen Source Nasal Swab     SARS CoV 2 Overall Result Not Detected     Influenza A Not Detected     Influenza B Not Detected    Narrative:      o Collect and clearly label specimen type:  o PREFERRED-Upper respiratory specimen: One Nasal Swab in  Transport Media.  o Hand deliver to laboratory ASAP  Diagnostic -PUI    High Sensitivity Troponin-I at 0 hrs [[id number]] Collected: 12/02/21 1502    Specimen: Blood Updated: 12/02/21 1544     hs Troponin-I <2.7 ng/L     Comprehensive metabolic panel [[id number]] Collected: 12/02/21 1502    Specimen: Blood Updated: 12/02/21 1536     Glucose 92 mg/dL      BUN 27.2 mg/dL      Creatinine 1.0 mg/dL      Sodium 536 mEq/L      Potassium 4.3 mEq/L      Chloride 105 mEq/L      CO2 24 mEq/L      Calcium 9.7 mg/dL      Protein, Total 7.6 g/dL      Albumin 4.9 g/dL      AST (SGOT) 20 U/L      ALT 26 U/L      Alkaline Phosphatase 75 U/L      Bilirubin, Total 0.5 mg/dL      Globulin 2.7 g/dL      Albumin/Globulin Ratio 1.8     Anion Gap 13.0    GFR [[id number]] Collected: 12/02/21 1502     Updated: 12/02/21 1536     EGFR >60.0       CBC and differential [[id number]]  (Abnormal) Collected: 12/02/21 1502    Specimen: Blood Updated: 12/02/21 1519     WBC 8.44 x10 3/uL  Hgb 16.0 g/dL      Hematocrit 16.1 %      Platelets 339 x10 3/uL      RBC 5.86 x10 6/uL      MCV 77.5 fL      MCH 27.3 pg      MCHC 35.2 g/dL      RDW 12 %      MPV 9.4 fL      Instrument Absolute Neutrophil Count 3.87 x10 3/uL      Neutrophils 45.8 %      Lymphocytes Automated 45.4 %      Monocytes  6.8 %      Eosinophils Automated 1.1 %      Basophils Automated 0.8 %      Immature Granulocytes 0.1 %      Nucleated RBC 0.0 /100 WBC      Neutrophils Absolute 3.87 x10 3/uL      Lymphocytes Absolute Automated 3.83 x10 3/uL      Monocytes Absolute Automated 0.57 x10 3/uL      Eosinophils Absolute Automated 0.09 x10 3/uL      Basophils Absolute Automated 0.07 x10 3/uL      Immature Granulocytes Absolute 0.01 x10 3/uL      Absolute NRBC 0.00 x10 3/uL     Glucose Whole Blood - POCT [[id number]] Collected: 12/02/21 1429     Updated: 12/02/21 1431     Whole Blood Glucose POCT 92 mg/dL             Radiologic Studies -   Radiology Results (24 Hour)       Procedure Component Value Units Date/Time    Chest AP Portable [[id number]] Collected: 12/02/21 1532    Order Status: Completed Updated: 12/02/21 1535    Narrative:      INDICATION: syncope cp    TECHNIQUE: Portable AP radiograph of the chest.    COMPARISON: Chest radiograph dated 07/06/2021.    FINDINGS: Cardiomediastinal silhouette is normal in size and  appearance.  Lungs are clear. Pleural spaces are clear. No pneumothorax.   Upper abdomen and regional osseous structures demonstrate no acute  abnormality.      Impression:          No acute thoracic process.     Aldean Ast, MD   12/02/2021 3:33 PM    CT Angiogram Head Neck [[id number]] Collected: 12/02/21 1509    Order Status: Completed Updated: 12/02/21 1529    Narrative:      CTA HEAD AND NECK    HISTORY: Left-sided weakness.    COMPARISON: Same day noncontrast head CT and CT perfusion study.    TECHNIQUE: Axial CT images were obtained from the skull vertex to the  superior thoracic aperture in early arterial phase after intravenous  administration of 45 mL Omnipaque 350 iodinated contrast. Reformatted  images in the coronal and sagittal planes were generated. 3-D images  were generated and reviewed on a separate workstation. CT scanning at  this site utilizes multiple dose reduction techniques, including  automatic  exposure control, adjustment of the mA and/or kV according to  patient size, and iterative reconstruction technique. Carotid stenoses  are estimated using NASCET criteria. Viz postprocessing software was  used to assess for large vessel occlusion.      FINDINGS:    NECK:    Aortic arch: Patent. Normal caliber.    Right carotid artery: No occlusion or significant (50% or greater)  stenosis.    Left carotid artery: No occlusion or significant (  50% or greater)  stenosis.    Vertebral arteries: Codominant. No occlusion or high-grade stenosis.  Mild luminal contour irregularity of the distal V2 segments largely  corresponds with areas of streak on coronal and sagittal reformatted  images and is, therefore, favored artifactual.      HEAD:    Minimal diffuse arterial contour irregularity without significant focal  beading or narrowing, favored artifactual.    Anterior circulation: No occlusion or high-grade stenosis. No aneurysm  identified.    Posterior circulation: No occlusion or high-grade stenosis. Duplicated  superior cerebellar arteries. No aneurysm identified.    Dural venous sinuses: Patent.        Impression:        1.  No large vessel intracranial arterial occlusion or high-grade  stenosis.  2.  No intracranial aneurysm identified.  3.  No occlusion or significant stenosis of the cervical carotid or  vertebral arteries.    Christy Gentles MD, MD   12/02/2021 3:27 PM    CT Angiogram Cerebral Perfusion W 3D Reconstruction [[id number]] Collected: 12/02/21 1526    Order Status: Completed Updated: 12/02/21 1529    Narrative:      CT ANGIOGRAM CEREBRAL PERFUSION WITH 3-D RECONSTRUCTION    HISTORY: Neuro deficit, acute, stroke suspected. Left-sided weakness.    COMPARISON: Same day noncontrast head CT and CTA of the head and neck..    TECHNIQUE: Repeat axial CT images of the brain were obtained after  intravenous administration of 45 mL Omnipaque 350 iodinated contrast  material. Viz postprocessing software was used to  generate CBV, CBF,  MTT, and Tmax maps.      FINDINGS:    CBF < 30%: 0 mL.  Tmax > 6 seconds: 0 mL.  Mismatch volume: 0 mL.  Mismatch ratio: N/A.  Hypoperfusion index: 0.  CBV index: N/A.    On visual view of the perfusion maps, perfusion appears relatively  symmetric.        Impression:          No evidence of acute large core infarct or substantial volume of  ischemic brain tissue at risk. If there is ongoing clinical concern, MRI  would be advised.    Christy Gentles MD, MD   12/02/2021 3:27 PM    CT Head WO Contrast [[id number]] Collected: 12/02/21 1450    Order Status: Completed Updated: 12/02/21 1457    Narrative:      CT HEAD WO CONTRAST    CLINICAL INDICATION:   L sided weakness    COMPARISON: None    TECHNIQUE: 5 mm axial images from the skull base to the vertex. The  following ?dose reduction techniques were utilized: automated exposure  control and/or adjustment of the mA and/or kV according to patient  size, and the use of iterative reconstruction technique.    FINDINGS:     The ventricles, cisterns, and sulci appear within normal size limits.  There is no mass effect or midline shift. There is no hemorrhage or  abnormal extra-axial fluid collection. The gray-white differentiation  appears maintained. Bone windows demonstrate no evidence for acute  osseous abnormality. The included paranasal sinuses and mastoid air  cells appear clear.      Impression:       No acute intracranial process.    Sandie Ano, MD   12/02/2021 2:54 PM        .    Medical Decision Making   I am the first provider for this patient.  I reviewed the vital signs, available nursing notes, past medical history, past surgical history, family history and social history.    Vital Signs-Reviewed the patient's vital signs.   Patient Vitals for the past 12 hrs:   BP Temp Pulse Resp   12/02/21 2000 112/71 97.8 F (36.6 C) -- 18   12/02/21 1938 113/76 97.9 F (36.6 C) (!) 52 20   12/02/21 1834 -- -- 61 --   12/02/21 1832 110/54 97.8 F  (36.6 C) (!) 57 18   12/02/21 1830 -- -- 62 --   12/02/21 1825 -- -- 63 --   12/02/21 1820 -- -- (!) 59 --   12/02/21 1815 -- -- (!) 59 --   12/02/21 1810 -- -- 63 --   12/02/21 1805 -- -- 63 --   12/02/21 1800 -- -- 60 --   12/02/21 1755 -- -- 65 --   12/02/21 1750 -- -- 64 --   12/02/21 1745 -- -- 61 --   12/02/21 1740 -- -- 64 --   12/02/21 1735 -- -- 63 --   12/02/21 1730 -- -- 65 --   12/02/21 1725 -- -- 62 --   12/02/21 1720 -- -- 62 --   12/02/21 1715 -- -- (!) 58 --   12/02/21 1710 -- -- 65 --   12/02/21 1705 -- -- 62 --   12/02/21 1700 -- -- (!) 58 --   12/02/21 1655 -- -- 61 --   12/02/21 1650 -- -- 60 --   12/02/21 1645 -- -- 62 --   12/02/21 1640 -- -- 67 --   12/02/21 1635 -- -- 67 --   12/02/21 1630 -- -- 64 --   12/02/21 1625 -- -- 63 --   12/02/21 1620 -- -- 63 --   12/02/21 1615 -- -- (!) 58 --   12/02/21 1610 -- -- 63 --   12/02/21 1605 -- -- 61 --   12/02/21 1600 119/63 -- 72 --   12/02/21 1555 -- -- 62 --   12/02/21 1550 -- -- 61 --   12/02/21 1545 -- -- 64 --   12/02/21 1540 -- -- 62 --   12/02/21 1535 -- -- 65 --   12/02/21 1530 -- -- 65 --   12/02/21 1526 122/73 -- 62 --   12/02/21 1525 -- -- 72 --   12/02/21 1520 -- -- 69 --   12/02/21 1515 -- -- 70 --   12/02/21 1418 137/89 97.8 F (36.6 C) 63 18         EKG:  Interpreted by the EP.   Time Interpreted: 1430   Rate: 64   Rhythm: NSR    Interpretation: no STEMI   Comparison: 07/06/21-- non-specific changes           Old Medical Records: Outside records, nursing notes    ED Course:     252 - d/aw Dr. Vincente Poli, on call neurology, who assessed pt in ED. Unlikely acute stroke given frequent similar presentations over the past year and multiple negative MRI brain and C-spine over the past year.     550 -reviewed all results impressions with patient.  Repeat neurological exam is stable.  Discussed risks and benefits of discharge home versus inpatient admission.  Using shared decision-making, patient agrees with plan for admission.    608 - d/aw Dr.  Pollyann Glen, accepts for Dr. Eleanora Neighbor [facility]    Provider Notes: Patient presenting to the ED with moderate severity acute episode of  syncope with associated acute left arm and leg numbness and weakness and chest pain.  With regard to chest pain, HEART score zero. Low suspicion for aortic dissection based on equal bilateral radial pulses, no mediastinal widening on CXR interpreted by me, and patient's description of symptoms. No pulsatile abdominal mass or abdominal tenderness to suggest AAA. Wells score zero, PERC zero, low suspicion for PE. No infectious complaints or risk factors to suggest pericarditis or endocarditis. No JVD, SOB, or cardiac enlargement on CXR to suggest tamponade. No PNA or PTX on CXR.  With regard to acute neurological symptoms, low clinical suspicion for acute stroke given multiple presentations with similar symptoms in 2022 to other healthcare facilities with MRI negative for stroke.  Discussed with neurology on-call at bedside.  Patient not tPA candidate due to low clinical suspicion for acute stroke and last normal more than 4.5 hours ago.  Patient was given aspirin.  Admitted for further evaluation.    Core measures: tPA not given due to low clinical suspicion for acute stroke and last normal more than 4.5 hours ago    CRITICAL CARE: The high probability of sudden, clinically significant deterioration in the patient's condition required the highest level of my preparedness to intervene urgently.    The services I provided to this patient were to treat and/or prevent clinically significant deterioration that could result in: death.  Services included the following: chart data review, reviewing nursing notes and/or old charts, documentation time, consultant collaboration regarding findings and treatment options, medication orders and management, direct patient care, re-evaluations, vital sign assessments and ordering, interpreting and reviewing diagnostic studies/lab tests.    Aggregate  critical care time was 75 minutes, which includes only time during which I was engaged in work directly related to the patient's care, as described above, whether at the bedside or elsewhere in the Emergency Department.  It did not include time spent performing other reported procedures or the services of residents, students, nurses or physician assistants.          Diagnosis     Clinical Impression:   1. Left-sided weakness    2. Chest pain, unspecified type        Treatment Plan:   ED Disposition       ED Disposition   Observation    Condition   --    Date/Time   Tue Dec 02, 2021  6:09 PM    Comment   Admitting Physician: Crawford Givens, HABIB A [[id number]]   Service:: Medicine [[id number]]   Estimated Length of Stay: < 2 midnights   Tentative Discharge Plan?: Home or Self Care [1]   Does patient need telemetry?: Yes   Telemetry type (separate Telemetry order is also required):: Cardiac telemetry                   _______________________________    This note was generated by the Epic EMR system/ Dragon speech recognition and may contain inherent errors or omissions not intended by the user. Grammatical errors, random word insertions, deletions and pronoun errors  are occasional consequences of this technology due to software limitations. Not all errors are caught or corrected. If there are questions or concerns about the content of this note or information contained within the body of this dictation they should be addressed directly with the author for clarification.      Attestations: This note is prepared by Lynnea Ferrier, MD    _______________________________       Maryella Shivers, MD  12/02/21 2350

## 2021-12-02 NOTE — ED Triage Notes (Addendum)
Pt presents for chest pain that began this afternoon after a syncopal episode. Pt states he sat down at his desk to work (from home) at 0830am and only remembers waking up on the floor at 1300. Pt states he woke up with the chest pain and weakness. 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 Denies fevers, cough, or shortness of breath.

## 2021-12-02 NOTE — H&P (Signed)
ADMISSION HISTORY AND PHYSICAL EXAM    Date Time: 12/02/21 7:05 PM  Patient Name: Kurt Rogers  Attending Physician: Unice Cobble, MD        History of Present Illness:   Kurt Rogers is a 31 y.o. male medical history significant for left lower extremity weakness that has been going on for about 2 years.  He had an extensive work-up in the past including MRI of the brain and was told that he does not have MS.  He has tingling sensation and weakness of the left lower leg.  He woke up at about 7 AM today and sit on a chair to do his work at his house and at about 1 PM he found himself on the floor of his hardwood floor room.  He claims that he did not remember anything.  He only remembers sitting himself at 7 AM to start working which he does work from home.  He has no weakness of the left upper extremity or the right side of his body.  He did not see any trauma or injury on his body.  He does not remember how he ended up on the floor.  Apparently he has lost consciousness according to him for about 6 hours.  He has no chest pain or shortness of breath or dizziness or vertigo or tinnitus.  He was evaluated by neurology at the ED and also had imaging studies which included CT of the head without contrast, CT angiogram of the head and neck, CT angiogram of the cerebral perfusion which is unremarked.        Past Medical History:     Past Medical History:   Diagnosis Date    Neurological abnormality     Numbness     TIA (transient ischemic attack)        Past Surgical History:     Past Surgical History:   Procedure Laterality Date    TONSILLECTOMY         Family History:   History reviewed. No pertinent family history.    Social History:     Social History     Socioeconomic History    Marital status: Single     Spouse name: Not on file    Number of children: Not on file    Years of education: Not on file    Highest education level: Not on file   Occupational History    Not on file   Tobacco Use     Smoking status: Never    Smokeless tobacco: Never   Substance and Sexual Activity    Alcohol use: Yes     Comment: socially    Drug use: No    Sexual activity: Not on file   Other Topics Concern    Not on file   Social History Narrative    Not on file     Social Determinants of Health     Financial Resource Strain: Not on file   Food Insecurity: Not on file   Transportation Needs: Not on file   Physical Activity: Not on file   Stress: Not on file   Social Connections: Not on file   Intimate Partner Violence: Not on file   Housing Stability: Not on file       Allergies:     Allergies   Allergen Reactions    Latex        Medications:   (Not in a hospital admission)    Current Discharge  Medication List        CONTINUE these medications which have NOT CHANGED    Details   amphetamine-dextroamphetamine (Adderall XR) 20 MG 24 hr capsule Adderall XR 20 mg capsule,extended release   TAKE 1 CAPSULE BY MOUTH EVERY DAY IN THE MORNING               Review of Systems:     A comprehensive review of systems was: History obtained from the patient    General: negative for - fever, chills, fatigue, malaise, night sweats,  weight changes  HEENT: negative for - headaches, nasal congestion, oral lesions, sinus pain, sore throat,  vertigo, visual changes or vocal changes  Pulm: negative for - cough, hemoptysis, pain, SOB, sputum change, wheezing  CVS: negative for - chest pain, dyspnea, edema,orthopnea, palpitations, Pnd  GI: negative for - odynophagia, pain, nausea, vomiting, diarrhea, melena  GU: negative for - dysuria, hematuria, incontinence, frequency, urgency  Msk: negative for - joint pain, joint stiffness, joint swelling, muscle pain   Neuro: negative for - confusion, dizziness, gait disturbance,,                                       memory loss, numbness/tingling, seizures  Heme: negative for - bleeding, blood clots, bruising, jaundice, pallor  Endoc: negative for - lethargy, polydipsia, polyuria, temp intolerance          Physical Exam:   BP 110/54   Pulse 61   Temp 97.8 F (36.6 C) (Oral)   Resp 18   Wt 88.5 kg (195 lb)   SpO2 96%   BMI 27.98 kg/m     General appearance - alert, well appearing, and in no distress, normal appearing weight  Eyes - pupils equal and reactive, extraocular eye movements intact  Mouth - mucous membranes moist, pharynx normal without lesions  Neck - supple, no significant adenopathy  Chest - clear to auscultation, no wheezes, rales or rhonchi, symmetric air entry  Heart - normal rate, regular rhythm, normal S1, S2, no murmurs, rubs, clicks or gallops  Abdomen - soft, nontender, nondistended, no masses or organomegaly  Neurological - alert, oriented, normal speech, motor disease muscle power on the left lower extremity is about 4/5 .  musculoskeletal - no joint tenderness, deformity or swelling  Extremities - peripheral pulses normal, no pedal edema, no clubbing or cyanosis  Skin - normal coloration and turgor, no rashes, no suspicious skin lesions noted    Labs:     Results       Procedure Component Value Units Date/Time    High Sensitivity Troponin-I at 2 hrs with calculated Delta [[id number]] Collected: 12/02/21 1709    Specimen: Blood Updated: 12/02/21 1744     hs Troponin-I <2.7 ng/L      hs Troponin-I Delta Unable toCalc. ng/L     COVID-19 (SARS-CoV-2) and Influenza A/B, NAA ([facility])- Admission [[id number]] Collected: 12/02/21 1502    Specimen: Culturette from Nasopharyngeal Updated: 12/02/21 1545     Purpose of COVID testing Diagnostic -[facility]     SARS-CoV-2 Specimen Source Nasal Swab     SARS CoV 2 Overall Result Not Detected     Influenza A Not Detected     Influenza B Not Detected    Narrative:      o Collect and clearly label specimen type:  o PREFERRED-Upper respiratory specimen: One Nasal Swab in  [employer].  o Hand deliver to laboratory ASAP  Diagnostic -[facility]    High Sensitivity Troponin-I at 0 hrs [[id number]] Collected: 12/02/21 1502    Specimen: Blood Updated: 12/02/21  1544     hs Troponin-I <2.7 ng/L     Comprehensive metabolic panel [[id number]] Collected: 12/02/21 1502    Specimen: Blood Updated: 12/02/21 1536     Glucose 92 mg/dL      BUN 33.2 mg/dL      Creatinine 1.0 mg/dL      Sodium 951 mEq/L      Potassium 4.3 mEq/L      Chloride 105 mEq/L      CO2 24 mEq/L      Calcium 9.7 mg/dL      Protein, Total 7.6 g/dL      Albumin 4.9 g/dL      AST (SGOT) 20 U/L      ALT 26 U/L      Alkaline Phosphatase 75 U/L      Bilirubin, Total 0.5 mg/dL      Globulin 2.7 g/dL      Albumin/Globulin Ratio 1.8     Anion Gap 13.0    GFR [[id number]] Collected: 12/02/21 1502     Updated: 12/02/21 1536     EGFR >60.0       CBC and differential [[id number]]  (Abnormal) Collected: 12/02/21 1502    Specimen: Blood Updated: 12/02/21 1519     WBC 8.44 x10 3/uL      Hgb 16.0 g/dL      Hematocrit 35.5 %      Platelets 339 x10 3/uL      RBC 5.86 x10 6/uL      MCV 77.5 fL      MCH 27.3 pg      MCHC 35.2 g/dL      RDW 12 %      MPV 9.4 fL      Instrument Absolute Neutrophil Count 3.87 x10 3/uL      Neutrophils 45.8 %      Lymphocytes Automated 45.4 %      Monocytes 6.8 %      Eosinophils Automated 1.1 %      Basophils Automated 0.8 %      Immature Granulocytes 0.1 %      Nucleated RBC 0.0 /100 WBC      Neutrophils Absolute 3.87 x10 3/uL      Lymphocytes Absolute Automated 3.83 x10 3/uL      Monocytes Absolute Automated 0.57 x10 3/uL      Eosinophils Absolute Automated 0.09 x10 3/uL      Basophils Absolute Automated 0.07 x10 3/uL      Immature Granulocytes Absolute 0.01 x10 3/uL      Absolute NRBC 0.00 x10 3/uL     Glucose Whole Blood - POCT [[id number]] Collected: 12/02/21 1429     Updated: 12/02/21 1431     Whole Blood Glucose POCT 92 mg/dL               Rads:     Radiological Procedure reviewed.  Radiology Results (24 Hour)       Procedure Component Value Units Date/Time    Chest AP Portable [[id number]] Collected: 12/02/21 1532    Order Status: Completed Updated: 12/02/21 1535    Narrative:      INDICATION:  syncope cp    TECHNIQUE: Portable AP radiograph of the chest.    COMPARISON: Chest radiograph dated 07/06/2021.    FINDINGS: Cardiomediastinal silhouette is normal in size and  appearance.  Lungs are clear. Pleural spaces are clear. No pneumothorax.   Upper abdomen and regional osseous structures demonstrate no acute  abnormality.      Impression:          No acute thoracic process.     Aldean Ast, MD   12/02/2021 3:33 PM    CT Angiogram Head Neck [[id number]] Collected: 12/02/21 1509    Order Status: Completed Updated: 12/02/21 1529    Narrative:      CTA HEAD AND NECK    HISTORY: Left-sided weakness.    COMPARISON: Same day noncontrast head CT and CT perfusion study.    TECHNIQUE: Axial CT images were obtained from the skull vertex to the  superior thoracic aperture in early arterial phase after intravenous  administration of 45 mL Omnipaque 350 iodinated contrast. Reformatted  images in the coronal and sagittal planes were generated. 3-D images  were generated and reviewed on a separate workstation. CT scanning at  this site utilizes multiple dose reduction techniques, including  automatic exposure control, adjustment of the mA and/or kV according to  patient size, and iterative reconstruction technique. Carotid stenoses  are estimated using NASCET criteria. Viz postprocessing software was  used to assess for large vessel occlusion.      FINDINGS:    NECK:    Aortic arch: Patent. Normal caliber.    Right carotid artery: No occlusion or significant (50% or greater)  stenosis.    Left carotid artery: No occlusion or significant (50% or greater)  stenosis.    Vertebral arteries: Codominant. No occlusion or high-grade stenosis.  Mild luminal contour irregularity of the distal V2 segments largely  corresponds with areas of streak on coronal and sagittal reformatted  images and is, therefore, favored artifactual.      HEAD:    Minimal diffuse arterial contour irregularity without significant focal  beading or narrowing,  favored artifactual.    Anterior circulation: No occlusion or high-grade stenosis. No aneurysm  identified.    Posterior circulation: No occlusion or high-grade stenosis. Duplicated  superior cerebellar arteries. No aneurysm identified.    Dural venous sinuses: Patent.        Impression:        1.  No large vessel intracranial arterial occlusion or high-grade  stenosis.  2.  No intracranial aneurysm identified.  3.  No occlusion or significant stenosis of the cervical carotid or  vertebral arteries.    Christy Gentles MD, MD   12/02/2021 3:27 PM    CT Angiogram Cerebral Perfusion W 3D Reconstruction [[id number]] Collected: 12/02/21 1526    Order Status: Completed Updated: 12/02/21 1529    Narrative:      CT ANGIOGRAM CEREBRAL PERFUSION WITH 3-D RECONSTRUCTION    HISTORY: Neuro deficit, acute, stroke suspected. Left-sided weakness.    COMPARISON: Same day noncontrast head CT and CTA of the head and neck..    TECHNIQUE: Repeat axial CT images of the brain were obtained after  intravenous administration of 45 mL Omnipaque 350 iodinated contrast  material. Viz postprocessing software was used to generate CBV, CBF,  MTT, and Tmax maps.      FINDINGS:    CBF < 30%: 0 mL.  Tmax > 6 seconds: 0 mL.  Mismatch volume: 0 mL.  Mismatch ratio: N/A.  Hypoperfusion index: 0.  CBV index: N/A.    On visual view of the perfusion maps, perfusion appears relatively  symmetric.        Impression:  No evidence of acute large core infarct or substantial volume of  ischemic brain tissue at risk. If there is ongoing clinical concern, MRI  would be advised.    Christy Gentles MD, MD   12/02/2021 3:27 PM    CT Head WO Contrast [[id number]] Collected: 12/02/21 1450    Order Status: Completed Updated: 12/02/21 1457    Narrative:      CT HEAD WO CONTRAST    CLINICAL INDICATION:   L sided weakness    COMPARISON: None    TECHNIQUE: 5 mm axial images from the skull base to the vertex. The  following ?dose reduction techniques were utilized:  automated exposure  control and/or adjustment of the mA and/or kV according to patient  size, and the use of iterative reconstruction technique.    FINDINGS:     The ventricles, cisterns, and sulci appear within normal size limits.  There is no mass effect or midline shift. There is no hemorrhage or  abnormal extra-axial fluid collection. The gray-white differentiation  appears maintained. Bone windows demonstrate no evidence for acute  osseous abnormality. The included paranasal sinuses and mastoid air  cells appear clear.      Impression:       No acute intracranial process.    Sandie Ano, MD   12/02/2021 2:54 PM          Assessment :   Loss of consciousness.  Left lower extremity weakness.  Chest tightness.      Plan  :     Admit to MCU.  Neurochecks every 4 hours.  Seizure precautions.  Neurology consult done from ED.  Further plan of care determined on neurology input.  DVT prophylaxis.  Discussed with the patient at bedside    Iris Pert, MD  12/02/2021  .7:05 PM      This note was generated by the Epic EMR system/ Dragon speech recognition and may contain inherent errors or omissions not intended by the user. Grammatical errors, random word insertions, deletions, pronoun errors and incomplete sentences are occasional consequences of this technology due to software limitations. Not all errors are caught or corrected. If there are questions or concerns about the content of this note or information contained within the body of this dictation they should be addressed directly with the author for clarification.

## 2021-12-03 DIAGNOSIS — R079 Chest pain, unspecified: Secondary | ICD-10-CM

## 2021-12-03 DIAGNOSIS — R531 Weakness: Secondary | ICD-10-CM

## 2021-12-03 LAB — LIPID PANEL
Cholesterol / HDL Ratio: 5.4 Index
Cholesterol: 163 mg/dL (ref 0–199)
HDL: 30 mg/dL — ABNORMAL LOW (ref 40–9999)
LDL Calculated: 108 mg/dL — ABNORMAL HIGH (ref 0–99)
Triglycerides: 125 mg/dL (ref 34–149)
VLDL Calculated: 25 mg/dL (ref 10–40)

## 2021-12-03 LAB — APTT: PTT: 37 s (ref 27–39)

## 2021-12-03 LAB — BASIC METABOLIC PANEL
Anion Gap: 11 (ref 5.0–15.0)
BUN: 16 mg/dL (ref 9.0–28.0)
CO2: 23 mEq/L (ref 17–29)
Calcium: 9.4 mg/dL (ref 8.5–10.5)
Chloride: 108 mEq/L (ref 99–111)
Creatinine: 0.9 mg/dL (ref 0.5–1.5)
Glucose: 88 mg/dL (ref 70–100)
Potassium: 4 mEq/L (ref 3.5–5.3)
Sodium: 142 mEq/L (ref 135–145)

## 2021-12-03 LAB — GFR: EGFR: >60

## 2021-12-03 LAB — PT/INR
PT INR: 1.1 (ref 0.9–1.1)
PT: 12.6 s (ref 10.1–12.9)

## 2021-12-03 LAB — ECG 12-LEAD
Atrial Rate: 64 {beats}/min
Atrial Rate: 65 {beats}/min
IHS MUSE NARRATIVE AND IMPRESSION: NORMAL
IHS MUSE NARRATIVE AND IMPRESSION: NORMAL
P Axis: 42 degrees
P Axis: 47 degrees
P-R Interval: 164 ms
P-R Interval: 174 ms
Q-T Interval: 386 ms
Q-T Interval: 398 ms
QRS Duration: 88 ms
QRS Duration: 98 ms
QTC Calculation (Bezet): 398 ms
QTC Calculation (Bezet): 413 ms
R Axis: 77 degrees
R Axis: 77 degrees
T Axis: 10 degrees
T Axis: 21 degrees
Ventricular Rate: 64 {beats}/min
Ventricular Rate: 65 {beats}/min

## 2021-12-03 LAB — CBC
Absolute NRBC: 0 10*3/uL (ref 0.00–0.00)
Hematocrit: 44.7 % (ref 37.6–49.6)
Hgb: 15.6 g/dL (ref 12.5–17.1)
MCH: 27.6 pg (ref 25.1–33.5)
MCHC: 34.9 g/dL (ref 31.5–35.8)
MCV: 79 fL (ref 78.0–96.0)
MPV: 9.6 fL (ref 8.9–12.5)
Nucleated RBC: 0 /100 WBC (ref 0.0–0.0)
Platelets: 338 10*3/uL (ref 142–346)
RBC: 5.66 10*6/uL (ref 4.20–5.90)
RDW: 12 % (ref 11–15)
WBC: 7.68 10*3/uL (ref 3.10–9.50)

## 2021-12-03 LAB — HEMOGLOBIN A1C
Average Estimated Glucose: 99.7 mg/dL
Hemoglobin A1C: 5.1 % (ref 4.6–5.9)

## 2021-12-03 MED ORDER — MORPHINE SULFATE 2 MG/ML IJ/IV SOLN (WRAP)
1.0000 mg | Status: DC | PRN
Start: 2021-12-03 — End: 2021-12-04
  Administered 2021-12-03: 1 mg via INTRAVENOUS
  Filled 2021-12-03: qty 1

## 2021-12-03 NOTE — Progress Notes (Signed)
As per order, EEG preformed, Report will follow.

## 2021-12-03 NOTE — Progress Notes (Signed)
Pt admitted from ED  via stretcher. Alert and oriented x 4. Denied pain or discomfort upon arrival. Pt oriented to room and unit. Call light within reach. Plan of care/ hourly rounding explained. He verbalized understanding. Placed on tele monitoring displaying NSR . HR 60. Tele communication sent.  Box # [id number].   Four eyes admission skin assessment done with Lyndal Pulley. Continue monitoring.

## 2021-12-03 NOTE — SLP Progress Note (Signed)
[facility]  Speech Language Pathology Cancellation Note      Patient:  Kurt Rogers MRN#:  [id number]      Patient not seen for Speech Therapy on 12/03/2021 secondary to  per report pt has no skilled speech therapy needs at this time.    Presley Raddle MS [facility] SLP   12/03/2021 1:54 PM  [phone]

## 2021-12-03 NOTE — Progress Notes (Signed)
New order for Morphine noted. Morphine 1 mg given for chest pain 4/10  with positive effect. Continue monitoring.

## 2021-12-03 NOTE — Procedures (Signed)
ROUTINE EEG REPORT      Patients name: Kurt Rogers   Patients date of birth: November 19, 1990   Date of study: 12/03/21    REASON FOR STUDY: Evaluate for seizure    HISTORY: Patient is a 31 y.o. male with history of h/o Left left weakness who presented with alteration of awareness.     Method: This electroencephalogram was recorded using both referential and differential montages. Using a digital machine the 18 channel International 10-20 System of electrode placement was used.        Background: The posterior dominant rhythm is 9 Hz.  The waking background is continuous, symmetric, reactive and consists of normal voltage 8-10 Hz activity which attenuates with eye opening. Some eye movement artifact  is seen frontally.  This EEG was recorded with the patient awake and drowsy.     Drowsiness is marked by disorganization and slowing of the background.     Abnormal activity : No epileptiform discharges, focal slowing, spike waves or clinical events noted.  Hemispheric asymmetry : none    Photic stimulation and hyperventilation produced no abnormality.    IMPRESSION: Normal study: This routine EEG recorded during wakefulness and drowsiness was within normal limits for age. There is no evidence of epileptiform activity or slowing.     A normal EEG can be seen in 39-53% of adult patients with epilepsy. Clinical correlation with neurologist is recommended. (Bladin E et al. Horatio Pel [phone]. Dannielle Huh et al. Clin Neurophysiology 2012;123:1732?35.)    Smitty Cords, MD   [facility]UR[facility]    Board Certified in Neurology - ABPN  Board Certified Clinical Neurophysiology - ABCN

## 2021-12-03 NOTE — Plan of Care (Incomplete)
Problem: Safety  Goal: Patient will be free from injury during hospitalization  Outcome: Progressing  Flowsheets (Taken 12/03/2021 2357)  Patient will be free from injury during hospitalization:   Assess patient's risk for falls and implement fall prevention plan of care per policy   Use appropriate transfer methods   Ensure appropriate safety devices are available at the bedside   Provide and maintain safe environment   Hourly rounding   Include patient/ family/ care giver in decisions related to safety   Assess for patients risk for elopement and implement Elopement Risk Plan per policy   Provide alternative method of communication if needed (communication boards, writing)  Goal: Patient will be free from infection during hospitalization  Outcome: Progressing  Flowsheets (Taken 12/03/2021 2357)  Free from Infection during hospitalization:   Assess and monitor for signs and symptoms of infection   Monitor all insertion sites (i.e. indwelling lines, tubes, urinary catheters, and drains)   Encourage patient and family to use good hand hygiene technique   Monitor lab/diagnostic results     Problem: Pain  Goal: Pain at adequate level as identified by patient  Outcome: Progressing  Flowsheets (Taken 12/03/2021 2357)  Pain at adequate level as identified by patient:   Identify patient comfort function goal   Assess for risk of opioid induced respiratory depression, including snoring/sleep apnea. Alert healthcare team of risk factors identified.   Assess pain on admission, during daily assessment and/or before any "as needed" intervention(s)     Problem: Every Day - Stroke  Goal: Core/Quality measure requirements - Daily  Outcome: Progressing  Flowsheets (Taken 12/03/2021 2357)  Core/Quality measure requirements - Daily:   VTE Prevention: Ensure anticoagulant(s) administered and/or anti-embolism stockings/devices documented by end of day 2   Ensure antithrombotic administered or contraindication documented by LIP by end of  day 2   Once lipid panel has resulted, check LDL. Contact provider for statin order if LDL > 70 (or ensure contraindication documented by LIP).   Continue stroke education (must include Modifiable Risk Factors, Warning Signs and Symptoms of Stroke, Activation of Emergency Medical System and Follow-up Appointments). Ensure handout has been given and documented.  Goal: Neurological status is stable or improving  Outcome: Progressing  Flowsheets (Taken 12/03/2021 1621 by Loleta Rose, RN)  Neurological status is stable or improving:   Monitor/assess/document neurological assessment (Stroke: every 4 hours)   Monitor/assess NIH Stroke Scale   Re-assess NIH Stroke Scale for any change in status   Observe for seizure activity and initiate seizure precautions if indicated   Perform CAM Assessment  Goal: Stable vital signs and fluid balance  Outcome: Progressing  Flowsheets (Taken 12/03/2021 2357)  Stable vital signs and fluid balance:   Position patient for maximum circulation/cardiac output   Monitor and assess vitals every 4 hours or as ordered and hemodynamic parameters   Monitor intake and output. Notify LIP if urine output is < 30 mL/hour.   Apply telemetry monitor as ordered   Encourage oral fluid intake

## 2021-12-03 NOTE — UM Notes (Signed)
[facility] Utilization Review NPI: [id number], Tax ID: [id number] Please Call [phone] with any questions or concerns. Confidential voicemail. Fax final authorizations and requests for additional information to [phone]    12/02/21 1809    Admit to Observation  Once       Diagnosis: Left-Sided Weakness   Level of Care: Intermediate Care   Patient Class: Observation   Admitting Physician CHOTANI, HABIB A     CC:He has tingling sensation and weakness of the left lower leg.  He woke up at about 7 AM today and sit on a chair to do his work at his house and at about 1 PM he found himself on the floor of his hardwood floor room.  He claims that he did not remember anything.  He only remembers sitting himself at 7 AM to start working which he does work from home.  He has no weakness of the left upper extremity or the right side of his body.   He does not remember how he ended up on the floor.  Apparently he has lost consciousness according to him for about 6 hours.  He has no chest pain or shortness of breath or dizziness or vertigo or tinnitus.    Neuro Consult:  Assessment:  31 y.o. M w/ hx of ADHD who presents with chest pain and worsening L sided weakness after syncopal event. LKW 0830 am.  NIHSS 3. Exam w/ LUE and LLE weakness and decreased sensation.  CT head nothing acute, CTA head/neck no LVO, CTP wnl. Not a tPA candidate, > 4.5 hrs since LKW and symptoms appears chronic. No indication for MET.       Etiology of chronic L sided weakness unclear. Defer MRI at this time as pt has had multiple negative MRIs over the past year for this ongoing L sided weakness. Recommend follow up with outpatient Neurologist.      Plan:  -Defer MRI brain   -Normotensive BP goal  -F/u w/ outpatient Neurologist at [facility]  -Okay to discharge from neuro standpoint   -Chest pain workup per ED           Past Medical History:  Diagnosis Date   Neurological abnormality    Numbness    TIA (transient ischemic attack)       VS:  97.8-61-18 110/54 sats 96    Labs: WNL    CTA Cerebral Perf:  No evidence of acute large core infarct or substantial volume of ischemic brain tissue at risk.    CTA Head/Neck:  1.  No large vessel intracranial arterial occlusion or high-grade stenosis.  2.  No intracranial aneurysm identified.  3.  No occlusion or significant stenosis of the cervical carotid or vertebral arteries.    CT Head: No acute intracranial process.    CXR: neg          Scheduled Meds:  Current Facility-Administered Medications  Medication Dose Route Frequency   amphetamine-dextroamphetamine  10 mg Oral 2XDAY at 0800 & 1200   aspirin  81 mg Oral Daily   atorvastatin  10 mg Oral QHS   heparin (porcine)  5,000 Units Subcutaneous Q12H SCH    Continuous Infusions:  PRN Meds:.hydrALAZINE, labetalol, morphine      Assessment/Plan:  Assessment :  Loss of consciousness.  Left lower extremity weakness.  Chest tightness.        Plan  :     Admit to MCU.  Neurochecks every 4 hours.  Seizure precautions.  Neurology consult done from  ED.  Further plan of care determined on neurology input.  DVT prophylaxis.  Discussed with the patient at bedside

## 2021-12-03 NOTE — Plan of Care (Addendum)
NURSING SHIFT NOTE     Patient: Kurt Rogers  Day: 0      SHIFT EVENTS     Safety and fall precautions remain in place. Purposeful rounding completed.          ASSESSMENT     Changes in assessment from patient's baseline this shift:    Neuro: No  CV: No  Pulm: No  Peripheral Vascular: No  HEENT: No  GI: No  BM during shift: No, Last BM:    GU: No   Integ: No  MS: No    Pain: None  Pain Interventions: None  Medications Utilized: NA    Mobility: PMP Activity: Step 7 - Walks out of Room of Distance Walked (ft) (Step 6,7): 200 Feet           Lines     Patient Lines/Drains/Airways Status       Active Lines, Drains and Airways       Name Placement date Placement time Site Days    Peripheral IV 12/02/21 20 G Anterior;Distal;Left Upper Arm 12/02/21  --  Upper Arm  1                         VITAL SIGNS     Vitals:    12/03/21 1557   BP: 149/83   Pulse: 80   Resp:    Temp:    SpO2: 97%       Temp  Min: 97.7 F (36.5 C)  Max: 98.2 F (36.8 C)  Pulse  Min: 52  Max: 80  Resp  Min: 17  Max: 20  BP  Min: 101/64  Max: 149/83  SpO2  Min: 94 %  Max: 98 %      Intake/Output Summary (Last 24 hours) at 12/03/2021 1855  Last data filed at 12/03/2021 0900  Gross per 24 hour   Intake 480 ml   Output 300 ml   Net 180 ml            CARE PLAN      Problem: Every Day - Stroke  Goal: Neurological status is stable or improving  Outcome: Progressing  Flowsheets (Taken 12/03/2021 1621)  Neurological status is stable or improving:   Monitor/assess/document neurological assessment (Stroke: every 4 hours)   Monitor/assess NIH Stroke Scale   Re-assess NIH Stroke Scale for any change in status   Observe for seizure activity and initiate seizure precautions if indicated   Perform CAM Assessment

## 2021-12-03 NOTE — Progress Notes (Addendum)
Around [place] Pt c/o non-radiating chest pain 6/10. Denied sob, palpitations., dizziness, lightheadedness or any associated symptoms. VSS. 12 Leads EKG done - NSR with Sinus  Arrhythmia. K 4.3. Na  142. Ca-9.7. Dr Crawford Givens paged. Waiting for call back.

## 2021-12-03 NOTE — Progress Notes (Signed)
[facility]  PROGRESS NOTE      Patient: Kurt Rogers  Date: 12/03/2021   LOS: 0 Days  Admission Date: 12/02/2021   MRN: [id number]  Attending: Clarene Critchley, MD   Please contact me on SecureChat       ASSESSMENT/PLAN     Kurt Rogers is a 31 y.o. male admitted with Left-sided weakness    Interval Summary:     Patient Active Hospital Problem List:    Chest pain:  EKG unremarkable for ischemic changes  Troponin negative on admission  CXR negative  Resume cardiac telemetry  Will consult Cardiology in the AM       LOC with Left sided weakness:  Pt has had L sided weakness since 2021 w/ extensive work up including multiple MRI brain and MRI C spine, LP x2 and serum studies which were unremarkable. Routine EEG wnl.   Cleared for discharge from neuro stand point    Nutrition: Regular diet    DVT Prophylaxis:Heparin SQ        Patient has BMI=Body mass index is 31.59 kg/m.  Diagnosis: Obesity based on BMI criteria    Code Status: FULL    Dispo: [facility] once seen by cardiology     Family Contact: N/A       SUBJECTIVE     Kurt Rogers states his chest pain issues have not addressed.Atypical in mature. Happening during night time. Woke him up from sleep.     MEDICATIONS     Current Facility-Administered Medications   Medication Dose Route Frequency    amphetamine-dextroamphetamine  10 mg Oral 2XDAY at 0800 & 1200    aspirin  81 mg Oral Daily    atorvastatin  10 mg Oral QHS    heparin (porcine)  5,000 Units Subcutaneous Q12H [facility]       PHYSICAL EXAM     Vitals:    12/03/21 1557   BP: 149/83   Pulse: 80   Resp:    Temp:    SpO2: 97%       Temperature: Temp  Min: 97.7 F (36.5 C)  Max: 98.2 F (36.8 C)  Pulse: Pulse  Min: 52  Max: 80  Respiratory: Resp  Min: 17  Max: 20  Non-Invasive BP: BP  Min: 101/64  Max: 149/83  Pulse Oximetry SpO2  Min: 94 %  Max: 98 %    Intake and Output Summary (Last 24 hours) at Date Time    Intake/Output Summary (Last 24 hours) at 12/03/2021 1740  Last data filed at  12/03/2021 0900  Gross per 24 hour   Intake 480 ml   Output 300 ml   Net 180 ml       GEN APPEARANCE: Normal;  A&OX3  HEENT:  Conjunctiva Clear  NECK: Supple  CVS: RRR, S1, S2; No M/G/R  LUNGS: CTAB; No Wheezes; No Rhonchi: No rales  EXT: No edema  NEURO: No Focal neurological deficits  \MENTAL STATUS:  Normal    Exam done by Clarene Critchley, MD on 12/03/21 at 5:40 PM      LABS     Recent Labs   Lab 12/03/21  0355 12/02/21  1502   WBC 7.68 8.44   RBC 5.66 5.86   Hgb 15.6 16.0   Hematocrit 44.7 45.4   MCV 79.0 77.5*   Platelets 338 339       Recent Labs   Lab 12/03/21  0355 12/02/21  1502   Sodium 142 142  Potassium 4.0 4.3   Chloride 108 105   CO2 23 24   BUN 16.0 13.0   Creatinine 0.9 1.0   Glucose 88 92   Calcium 9.4 9.7       Recent Labs   Lab 12/02/21  1502   ALT 26   AST (SGOT) 20   Bilirubin, Total 0.5   Albumin 4.9   Alkaline Phosphatase 75       Recent Labs   Lab 12/02/21  1709 12/02/21  1502   hs Troponin-I <2.7 <2.7   hs Troponin-I Delta Unable toCalc.  --        Recent Labs   Lab 12/03/21  0355   PT INR 1.1   PT 12.6   PTT 37       Microbiology Results (last 15 days)       Procedure Component Value Units Date/Time    COVID-19 (SARS-CoV-2) and Influenza A/B, NAA (Liat Rapid)- Admission [[id number]][[id number]] Collected: 12/02/21 1502    Order Status: Completed Specimen: Culturette from Nasopharyngeal Updated: 12/02/21 1545     Purpose of COVID testing Diagnostic -PUI     SARS-CoV-2 Specimen Source Nasal Swab     SARS CoV 2 Overall Result Not Detected     Comment: __________________________________________________  -A result of "Detected" indicates POSITIVE for the    presence of SARS CoV-2 RNA  -A result of "Not Detected" indicates NEGATIVE for the    presence of SARS CoV-2 RNA  __________________________________________________________  Test performed using the Roche cobas Liat SARS-CoV-2 assay. This assay is  only for use under the Food and Drug Administration's Emergency Use  Authorization. This is a real-time  RT-PCR assay for the qualitative  detection of SARS-CoV-2 RNA. Viral nucleic acids may persist in vivo,  independent of viability. Detection of viral nucleic acid does not imply the  presence of infectious virus, or that virus nucleic acid is the cause of  clinical symptoms. Negative results do not preclude SARS-CoV-2 infection and  should not be used as the sole basis for diagnosis, treatment or other  patient management decisions. Negative results must be combined with  clinical observations, patient history, and/or epidemiological information.  Invalid results may be due to inhibiting substances in the specimen and  recollection should occur. Please see Fact Sheets for patients and providers  located:  WirelessDSLBlog.[email or web address]-fact-sheets          Influenza A Not Detected     Influenza B Not Detected     Comment: Test performed using the Roche cobas Liat SARS-CoV-2 & Influenza A/B assay.  This assay is only for use under the Food and Drug Administration's  Emergency Use Authorization. This is a multiplex real-time RT-PCR assay  intended for the simultaneous in vitro qualitative detection and  differentiation of SARS-CoV-2, influenza A, and influenza B virus RNA. Viral  nucleic acids may persist in vivo, independent of viability. Detection of  viral nucleic acid does not imply the presence of infectious virus, or that  virus nucleic acid is the cause of clinical symptoms. Negative results do  not preclude SARS-CoV-2, influenza A, and/or influenza B infection and  should not be used as the sole basis for diagnosis, treatment or other  patient management decisions. Negative results must be combined with  clinical observations, patient history, and/or epidemiological information.  Invalid results may be due to inhibiting substances in the specimen and  recollection should occur. Please see Fact Sheets for patients and providers  located: http://www.[employer].biz/[email or web address]/covid19/test-fact-sheets.  Narrative:      o  Collect and clearly label specimen type:  o PREFERRED-Upper respiratory specimen: One Nasal Swab in  Transport Media.  o Hand deliver to laboratory ASAP  Diagnostic -PUI             RADIOLOGY     CT Angiogram Head Neck    Result Date: 12/02/2021  1.  No large vessel intracranial arterial occlusion or high-grade stenosis. 2.  No intracranial aneurysm identified. 3.  No occlusion or significant stenosis of the cervical carotid or vertebral arteries. Christy Gentles MD, MD  12/02/2021 3:27 PM    CT Head WO Contrast    Result Date: 12/02/2021   No acute intracranial process. Sandie Ano, MD  12/02/2021 2:54 PM    CT Angiogram Cerebral Perfusion W 3D Reconstruction    Result Date: 12/02/2021  No evidence of acute large core infarct or substantial volume of ischemic brain tissue at risk. If there is ongoing clinical concern, MRI would be advised. Christy Gentles MD, MD  12/02/2021 3:27 PM    Chest AP Portable    Result Date: 12/02/2021  No acute thoracic process. Aldean Ast, MD  12/02/2021 3:33 PM   Echo Results       None          Results for orders placed or performed during the hospital encounter of 12/02/21   CT Head WO Contrast    Narrative    CT HEAD WO CONTRAST    CLINICAL INDICATION:   L sided weakness    COMPARISON: None    TECHNIQUE: 5 mm axial images from the skull base to the vertex. The  following ?dose reduction techniques were utilized: automated exposure  control and/or adjustment of the mA and/or kV according to patient  size, and the use of iterative reconstruction technique.    FINDINGS:     The ventricles, cisterns, and sulci appear within normal size limits.  There is no mass effect or midline shift. There is no hemorrhage or  abnormal extra-axial fluid collection. The gray-white differentiation  appears maintained. Bone windows demonstrate no evidence for acute  osseous abnormality. The included paranasal sinuses and mastoid air  cells appear clear.      Impression     No acute intracranial  process.    Sandie Ano, MD   12/02/2021 2:54 PM       Signed,  Clarene Critchley, MD  5:40 PM 12/03/2021

## 2021-12-03 NOTE — Progress Notes (Signed)
NIHSS 4. Lt sided weakness 4/5, decrease sensation and Lt lower quadrant hemianopsia present. NIHSS 0 in ED. NIHSS 3 per neurologist. Dr Crawford Givens notified and said to contact the neurologist. Consepcion Hearing NP neurologist paged. Waiting for call back.

## 2021-12-03 NOTE — Progress Notes (Signed)
[facility] Consultation Progress Note    Date Time: 12/03/21 11:05 AM  Patient Name: Jonas,Amour MICHAEL  Outpatient Neurologist :    CC: stroke alert       Assessment:   31 y.o. M w/ hx of ADHD who presents with chest pain and worsening L sided weakness after syncopal event. LKW 0830 am.  NIHSS 3. Exam w/ LUE and LLE weakness and decreased sensation.  CT head nothing acute, CTA head/neck no LVO, CTP wnl. Not a tPA candidate, > 4.5 hrs since LKW and symptoms appears chronic. No indication for MET.       Etiology of chronic L sided weakness unclear. Defer MRI and LP at this time as pt has had multiple unremarkable MRIs recently for this ongoing L sided weakness. Has had two LP's int he past 1.5 yrs which were also unremarkable. Will check routine EEG to r/o any seizures in setting of syncopal episode     Plan:   -EEG: results pending   -Defer MRI and LP at this time   -PT/OT  -F/u w/ primary neurologist, Dr. Florestine Avers at [facility]    Interval History/Subjective:   Reports CP overnight. EKG done- NSR w/ sinus arrhythmia. Given one time dose of morphine. Reports 4/10 CP at this time.  Reports l sided weakness is at his baseline and decreased sensation to LUE/LLE has slightly improved.   Medications:     Current Facility-Administered Medications   Medication Dose Route Frequency    amphetamine-dextroamphetamine  10 mg Oral 2XDAY at 0800 & 1200    aspirin  81 mg Oral Daily    atorvastatin  10 mg Oral QHS    heparin (porcine)  5,000 Units Subcutaneous Q12H [facility]       Review of Systems:   Neurological ROS: negative    Physical Exam:   Temp:  [97.7 F (36.5 C)-98.1 F (36.7 C)] 97.7 F (36.5 C)  Heart Rate:  [52-72] 55  Resp Rate:  [17-20] 18  BP: (101-137)/(54-89) 101/64    Vital Signs:  Reviewed    General: Well developed and well nourished. No acute distress. Cooperative with the exam  Neck: Symmetric, no deformities  Resp: No audible wheezing, normal work of breathing  Skin: Intact, extremities normal in color     Mental  Status: The patient is awake, alert and oriented to person, place, and time. Speech fluent without word finding difficulties. Naming intact, repetition intact. Follows commands.      Cranial nerves:   -CN II: Visual fields full to bedside confrontation   -CN III, IV, VI: Pupils equal, round, and reactive to light; extraocular movements intact; no ptosis  -CN V: Facial sensation intact in V1 through V3 distributions   -CN VII: Face symmetric   -CN VIII: Hearing intact to conversational speech   -CN IX, X: Palate elevates symmetrically; normal phonation   -CN XII: Tongue protrudes midline     Motor: Muscle tone normal without spasticity or flaccidity.  UEs:    Deltoid Bicep Tricep Grip   Right 5 5 5 5    Left 4 4 4 4       LEs    HF KF KE DF   Right 5 5 5 5    Left 4 4 4 4       Sensory: Light touch intact diminished to LUE/LLE. No extinction  Coordination: FTN intact, No tremors  Gait:deferred    Labs:     Results       Procedure Component Value Units  Date/Time    Lipid panel [[id number]][[id number]] Collected: 12/03/21 0355    Specimen: Blood Updated: 12/03/21 1044    Narrative:      Fasting specimen    Hemoglobin A1C [[id number]][[id number]] Collected: 12/03/21 0355    Specimen: Blood Updated: 12/03/21 1042    Basic Metabolic Panel [[id number]][[id number]] Collected: 12/03/21 0355    Specimen: Blood Updated: 12/03/21 0540     Glucose 88 mg/dL      BUN 08.616.0 mg/dL      Creatinine 0.9 mg/dL      Calcium 9.4 mg/dL      Sodium 578142 mEq/L      Potassium 4.0 mEq/L      Chloride 108 mEq/L      CO2 23 mEq/L      Anion Gap 11.0    GFR [[id number]][[id number]] Collected: 12/03/21 0355     Updated: 12/03/21 0540     EGFR >60.0       APTT [[id number]][[id number]] Collected: 12/03/21 0355     Updated: 12/03/21 0529     PTT 37 sec     Prothrombin time/INR [[id number]][[id number]] Collected: 12/03/21 0355    Specimen: Blood Updated: 12/03/21 0529     PT 12.6 sec      PT INR 1.1    CBC without differential [[id number]][[id number]] Collected: 12/03/21 0355    Specimen: Blood Updated: 12/03/21 0520     WBC 7.68 x10 3/uL       Hgb 15.6 g/dL      Hematocrit 63.844.7 %      Platelets 338 x10 3/uL      RBC 5.66 x10 6/uL      MCV 79.0 fL      MCH 27.6 pg      MCHC 34.9 g/dL      RDW 12 %      MPV 9.6 fL      Nucleated RBC 0.0 /100 WBC      Absolute NRBC 0.00 x10 3/uL     High Sensitivity Troponin-I at 2 hrs with calculated Delta [[id number]][[id number]] Collected: 12/02/21 1709    Specimen: Blood Updated: 12/02/21 1744     hs Troponin-I <2.7 ng/L      hs Troponin-I Delta Unable toCalc. ng/L     COVID-19 (SARS-CoV-2) and Influenza A/B, NAA (Liat Rapid)- Admission [[id number]][[id number]] Collected: 12/02/21 1502    Specimen: Culturette from Nasopharyngeal Updated: 12/02/21 1545     Purpose of COVID testing Diagnostic -PUI     SARS-CoV-2 Specimen Source Nasal Swab     SARS CoV 2 Overall Result Not Detected     Influenza A Not Detected     Influenza B Not Detected    Narrative:      o Collect and clearly label specimen type:  o PREFERRED-Upper respiratory specimen: One Nasal Swab in  Transport Media.  o Hand deliver to laboratory ASAP  Diagnostic -PUI    High Sensitivity Troponin-I at 0 hrs [[id number]][[id number]] Collected: 12/02/21 1502    Specimen: Blood Updated: 12/02/21 1544     hs Troponin-I <2.7 ng/L     Comprehensive metabolic panel [[id number]][[id number]] Collected: 12/02/21 1502    Specimen: Blood Updated: 12/02/21 1536     Glucose 92 mg/dL      BUN 25.413.0 mg/dL      Creatinine 1.0 mg/dL      Sodium 270142 mEq/L      Potassium 4.3 mEq/L      Chloride 105 mEq/L      CO2 24 mEq/L      Calcium 9.7  mg/dL      Protein, Total 7.6 g/dL      Albumin 4.9 g/dL      AST (SGOT) 20 U/L      ALT 26 U/L      Alkaline Phosphatase 75 U/L      Bilirubin, Total 0.5 mg/dL      Globulin 2.7 g/dL      Albumin/Globulin Ratio 1.8     Anion Gap 13.0    GFR [[id number]] Collected: 12/02/21 1502     Updated: 12/02/21 1536     EGFR >60.0       CBC and differential [[id number]]  (Abnormal) Collected: 12/02/21 1502    Specimen: Blood Updated: 12/02/21 1519     WBC 8.44 x10 3/uL      Hgb 16.0 g/dL      Hematocrit 99.2 %       Platelets 339 x10 3/uL      RBC 5.86 x10 6/uL      MCV 77.5 fL      MCH 27.3 pg      MCHC 35.2 g/dL      RDW 12 %      MPV 9.4 fL      Instrument Absolute Neutrophil Count 3.87 x10 3/uL      Neutrophils 45.8 %      Lymphocytes Automated 45.4 %      Monocytes 6.8 %      Eosinophils Automated 1.1 %      Basophils Automated 0.8 %      Immature Granulocytes 0.1 %      Nucleated RBC 0.0 /100 WBC      Neutrophils Absolute 3.87 x10 3/uL      Lymphocytes Absolute Automated 3.83 x10 3/uL      Monocytes Absolute Automated 0.57 x10 3/uL      Eosinophils Absolute Automated 0.09 x10 3/uL      Basophils Absolute Automated 0.07 x10 3/uL      Immature Granulocytes Absolute 0.01 x10 3/uL      Absolute NRBC 0.00 x10 3/uL     Glucose Whole Blood - POCT [[id number]] Collected: 12/02/21 1429     Updated: 12/02/21 1431     Whole Blood Glucose POCT 92 mg/dL             Rads:   CT Angiogram Head Neck    Result Date: 12/02/2021  1.  No large vessel intracranial arterial occlusion or high-grade stenosis. 2.  No intracranial aneurysm identified. 3.  No occlusion or significant stenosis of the cervical carotid or vertebral arteries. Christy Gentles MD, MD  12/02/2021 3:27 PM    CT Head WO Contrast    Result Date: 12/02/2021   No acute intracranial process. Sandie Ano, MD  12/02/2021 2:54 PM    CT Angiogram Cerebral Perfusion W 3D Reconstruction    Result Date: 12/02/2021  No evidence of acute large core infarct or substantial volume of ischemic brain tissue at risk. If there is ongoing clinical concern, MRI would be advised. Christy Gentles MD, MD  12/02/2021 3:27 PM    Chest AP Portable    Result Date: 12/02/2021  No acute thoracic process. Aldean Ast, MD  12/02/2021 3:33 PM     Signed by:  Consepcion Hearing, NP-C  Nurse Practitioner  [facility]  Spectra: I[facility] [phone]     *Final recommendations per attending neurologist who will also see patient today and record notes.

## 2021-12-03 NOTE — PT Eval Note (Signed)
.Physical Therapy Eval and Treatment  Kurt Rogers      Post Acute Care Therapy Recommendations   Discharge Recommendations:  Home with no needs  D/C Milestones: stair training Anticipate achievement in 1 sessions      DME needs IF patient is discharging home: No additional equipment/DME recommended at this time    Therapy discharge recommendations may change with patient status.  Please refer to most recent note for up-to-date recommendations.    Is an Occupational Therapy Evaluation Indicated at this time? No, an acute care OT evaluation is not required at this time.    Unit: 25 [facility] CARE  Bed: A2504/A2504-A      ___________________________________________________    Time of Evaluation and Treatment:  Time Calculation  PT Received On: 12/03/21  Start Time: 1055  Stop Time: 1111  Time Calculation (min): 16 min    Evaluation Time: 8 minutes  Treatment Time: 8 minutes    Chart Review and Collaboration with Care Team: 5 minutes, not included in above time    PT Visit Number: 1    Consult received for Kurt Rogers for PT Evaluation and Treatment.  Patient's medical condition is appropriate for Physical therapy intervention at this time.    Activity Orders:  PT eval and treat    Precautions and Contraindications:  Precautions  Weight Bearing Status: no restrictions  Fall Risks: Medium, History of fall(s), Impaired balance/gait, Impaired mobility, Muscle weakness    Personal Protective Equipment (PPE)  gloves and procedure mask    Medical Diagnosis:  Left-sided weakness [R53.1]  Chest pain, unspecified type [R07.9]    History of Present Illness:  Kurt Rogers is a 31 y.o. male admitted on 12/02/2021 with moderate severity, sudden onset, generalized episode of syncope that occurred today morning while patient was at home.  Patient states he woke up at around 1:30 PM today with midline pressure nonradiating chest pain that is spontaneously improving and weakness and numbness in the  left arm.    Patient Active Problem List   Diagnosis    Left-sided weakness       Past Medical/Surgical History:  Past Medical History:   Diagnosis Date    Neurological abnormality     Numbness     TIA (transient ischemic attack)      Past Surgical History:   Procedure Laterality Date    TONSILLECTOMY         X-Rays/Tests/Labs:  Lab Results   Component Value Date/Time    HGB 15.6 12/03/2021 03:55 AM    HCT 44.7 12/03/2021 03:55 AM    K 4.0 12/03/2021 03:55 AM    NA 142 12/03/2021 03:55 AM    INR 1.1 12/03/2021 03:55 AM    TROPI <2.7 12/02/2021 05:09 PM    TROPI Unable toCalc. 12/02/2021 05:09 PM    TROPI <2.7 12/02/2021 03:02 PM    TROPI <0.01 07/06/2021 12:29 PM       All imaging reviewed, please see chart for details.    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 Nursing clears patient for therapy.  Patient Goal: none reported  Pain Assessment  Pain Assessment: No/denies pain      Objective:  Observation of Patient/Vital Signs:  Marland Kitchen  Vitals:    12/03/21 0752   BP: 101/64   Pulse: (!) 55   Resp: 18   Temp: 97.7 F (36.5 C)   SpO2: 97%            Cognitive Status and Neuro Exam:  Cognition/Neuro Status  Arousal/Alertness: Appropriate responses to stimuli  Attention Span: Appears intact  Orientation Level: Oriented X4  Memory: Appears intact  Following Commands: Follows all commands and directions without difficulty  Safety Awareness: independent  Insights: Educated in Engineer, building services  Problem Solving:  Able to problem solve independently  Behavior: calm;cooperative    Musculoskeletal Examination  Gross ROM  Right Lower Extremity ROM: within functional limits  Left Lower Extremity ROM: within functional limits  Gross Strength  Right Lower Extremity Strength: 4+/5  Left Lower Extremity Strength: 4/5       Functional Mobility:  Functional Mobility  Supine to Sit: Independent  Scooting to HOB: Independent  Scooting to EOB: Independent  Sit to Supine: Independent  Sit to Stand: Supervision  Stand to Sit: Supervision     Locomotion  Ambulation: Contact Guard Assist;with single point cane  Pattern: decreased cadence;decreased step length;Step through     Balance  Balance  Sitting - Static: Good  Standing - Static: Fair    Participation and Activity Tolerance  Participation and Endurance  Participation Effort: good  Endurance: Tolerates < 10 min exercise, no significant change in vital signs  [place] Dyspnea Scale: 1+ Dyspnea    Educated the Patient to role of physical therapy, plan of care, goals of therapy and HEP, safety with mobility and ADLs, discharge instructions, home safety with verbalized understanding .    Patient left in bed with alarm and all other medical equipment in place and call bell and all personal items/needs within reach.  RN notified of session outcome.        Assessment:  Kurt Rogers is a 31 y.o. male admitted 12/02/2021.  PT Assessment  Assessment: Decreased LE strength;Decreased functional mobility;Decreased balance;Gait impairment;Decreased safety/judgement during functional mobility;Decreased endurance/activity tolerance  Prognosis: Good;With continued PT status post acute discharge  Progress: Improving as expected      Treatment:  Pt received supine in bed, answered questions about plof. Pt completed mobility as above, pt ambulated with SPC around unit 25 with CGA for balance, steadying ast, no LOB during session however slow gait with uneven step lengths. Pt reported SOB,  fatigue post, O2 sat 99% RA. Pt instructed on plan of care and D/C recommendations. All needs in reach, alarm on.       Plan:  Treatment/Interventions: Exercise, Gait training, Stair training, Functional transfer training, LE strengthening/ROM, Endurance training, Patient/family training, Equipment eval/education  PT Frequency: 1-2x/wk  Risks/Benefits/POC Discussed with Pt/Family: With patient    PMP Activity: Step 7 - Walks out of Room  Distance Walked (ft) (Step 6,7): 200 Feet      Goals:  Goals  Goal Formulation: With patient  Time for Goal Acheivement: By time of discharge  Goals: Select goal  Pt Will Transfer Bed/Chair: modified independent, to maximize functional mobility and independence  Pt Will Ambulate: 51-100 feet, modified independent, to maximize functional mobility and independence  Pt Will Go Up / Down Stairs: 3-5 stairs,  with supervision, With rail, to maximize functional mobility and independence    Alanson Puls, PT,DPT  [id number]  12/03/2021 11:37 AM  Hours:W-F 9-530      [facility]  Patient: Kurt Rogers MRN#: [id number]  Unit: 25 [facility] Bed: A2130/Q6578-I

## 2021-12-03 NOTE — Plan of Care (Signed)
Pt admitted with Dx Lt sided weakness. Placed on STROKE protocol. NIHSS 4. Alert and oriented x 4. Denies diplopia , blurry vision. Lt sided weakness 4/5 preexistent condition. Decreased sensation to Lt sided and Lt lower quadrant hemianopsia present.  Speech clear , coherent. Neuro on board. Plan for EEG in am. Verbal/ written info re: Ischemic stroke given. Pt able to teach back. Continue monitoring.  Problem: Safety  Goal: Patient will be free from injury during hospitalization  Outcome: Progressing  Flowsheets (Taken 12/03/2021 0435)  Patient will be free from injury during hospitalization:   Assess patient's risk for falls and implement fall prevention plan of care per policy   Provide and maintain safe environment   Use appropriate transfer methods   Ensure appropriate safety devices are available at the bedside   Include patient/ family/ care giver in decisions related to safety   [place]our[place] rounding     Problem: Pain  Goal: Pain at adequate level as identified by patient  Outcome: Progressing  Flowsheets (Taken 12/03/2021 0435)  Pain at adequate level as identified by patient:   Identify patient comfort function goal   Assess for risk of opioid induced respiratory depression, including snoring/sleep apnea. Alert healthcare team of risk factors identified.   Assess pain on admission, during daily assessment and/or before any "as needed" intervention(s)   Reassess pain within 30-60 minutes of any procedure/intervention, per Pain Assessment, Intervention, Reassessment (AIR) Cycle   Evaluate if patient comfort function goal is met   Evaluate patient's satisfaction with pain management progress   Offer non-pharmacological pain management interventions   Include patient/patient care companion in decisions related to pain management as needed     Problem: Side Effects from Pain Analgesia  Goal: Patient will experience minimal side effects of analgesic therapy  Outcome: Progressing  Flowsheets (Taken 12/03/2021  0435)  Patient will experience minimal side effects of analgesic therapy:   Monitor/assess patient's respiratory status (RR depth, effort, breath sounds)   Prevent/manage side effects per LIP orders (i.e. nausea, vomiting, pruritus, constipation, urinary retention, etc.)   Evaluate for opioid-induced sedation with appropriate assessment tool (i.e. POSS)     Problem: Day of Admission - Stroke  Goal: Core/Quality measure requirements - Admission  Flowsheets (Taken 12/03/2021 0435)  Core/Quality measure requirements - Admission:   Document NIH Stroke Scale on admission   Document nursing swallow/dysphagia screen on admission. If patient fails, keep patient NPO (follow your hospital protocol on swallowing screening).   VTE Prevention: Ensure anticoagulant(s) administered and/or anti-embolism stockings/devices documented as ordered   Ensure antithrombotic administered or contraindication documented by LIP   Ensure lipid panel ordered   Begin stroke education on admission (must include Modifiable Risk Factors, Warning Signs and Symptoms of Stroke, Activation of Emergency Medical System and Follow-up Appointments) Ensure handout has been given and documented.   Ensure PT/OT and/or SLP ordered  Note: Rounded on patient. Discussed signs and symptoms of stroke, when to call 911, risk factors, disease process, imaging, treatment plan, rehab, medications, discharge plan and consequences of noncompliance. Patient verbalized understanding. All questions and concerns were addressed. STROKE booklet given

## 2021-12-04 DIAGNOSIS — R072 Precordial pain: Secondary | ICD-10-CM

## 2021-12-04 LAB — CBC AND DIFFERENTIAL
Absolute NRBC: 0 10*3/uL (ref 0.00–0.00)
Basophils Absolute Automated: 0.05 10*3/uL (ref 0.00–0.08)
Basophils Automated: 0.8 %
Eosinophils Absolute Automated: 0.07 10*3/uL (ref 0.00–0.44)
Eosinophils Automated: 1.1 %
Hematocrit: 46.4 % (ref 37.6–49.6)
Hgb: 16.2 g/dL (ref 12.5–17.1)
Immature Granulocytes Absolute: 0.02 10*3/uL (ref 0.00–0.07)
Immature Granulocytes: 0.3 %
Instrument Absolute Neutrophil Count: 3.93 10*3/uL (ref 1.10–6.33)
Lymphocytes Absolute Automated: 2.28 10*3/uL (ref 0.42–3.22)
Lymphocytes Automated: 34.3 %
MCH: 27.3 pg (ref 25.1–33.5)
MCHC: 34.9 g/dL (ref 31.5–35.8)
MCV: 78.2 fL (ref 78.0–96.0)
MPV: 9.2 fL (ref 8.9–12.5)
Monocytes Absolute Automated: 0.29 10*3/uL (ref 0.21–0.85)
Monocytes: 4.4 %
Neutrophils Absolute: 3.93 10*3/uL (ref 1.10–6.33)
Neutrophils: 59.1 %
Nucleated RBC: 0 /100 WBC (ref 0.0–0.0)
Platelets: 327 10*3/uL (ref 142–346)
RBC: 5.93 10*6/uL — ABNORMAL HIGH (ref 4.20–5.90)
RDW: 12 % (ref 11–15)
WBC: 6.64 10*3/uL (ref 3.10–9.50)

## 2021-12-04 LAB — BASIC METABOLIC PANEL
Anion Gap: 12 (ref 5.0–15.0)
Anion Gap: 10 (ref 5.0–15.0)
BUN: 17 mg/dL (ref 9.0–28.0)
BUN: 18 mg/dL (ref 9.0–28.0)
CO2: 18 mEq/L (ref 17–29)
CO2: 25 mEq/L (ref 17–29)
Calcium: 9.6 mg/dL (ref 8.5–10.5)
Calcium: 9.7 mg/dL (ref 8.5–10.5)
Chloride: 111 mEq/L (ref 99–111)
Chloride: 107 mEq/L (ref 99–111)
Creatinine: 0.9 mg/dL (ref 0.5–1.5)
Creatinine: 1 mg/dL (ref 0.5–1.5)
Glucose: 92 mg/dL (ref 70–100)
Glucose: 128 mg/dL — ABNORMAL HIGH (ref 70–100)
Potassium: 4.2 mEq/L (ref 3.5–5.3)
Potassium: 3.8 mEq/L (ref 3.5–5.3)
Sodium: 141 mEq/L (ref 135–145)
Sodium: 142 mEq/L (ref 135–145)

## 2021-12-04 LAB — GFR
EGFR: >60
EGFR: >60

## 2021-12-04 NOTE — Consults (Signed)
[facility] CONSULT NOTE  Date:  12/04/2021  9:35 AM  Primary Cardiologist: Dr. Willeen Cass, Arna Snipe, MD    Case Discussed with [facility] Attending Carolynne Edouard, MD).  Discussed plan of care/procedure with patient/family who stated understanding.  Provider Completing Note:  Margaretmary Dys. Claudean Severance, AGNP-C  [facility]  [facility]  [facility] [phone] (7am-7pm)  OFFICE Number [phone] (7pm-7am)  MD LINE [phone]    Patient:  Kurt Rogers.  DOB: 06-Oct-1991.  male  Date of Admission:  12/02/2021  Attending:  Clarene Critchley, MD     Patient was seen in consultation at the request of Dr. Joyce Gross        Assessment and Plan:   I have seen and examined the patient with the NP and agree with the H&P and plan as outlined below by the NP with the follow exceptions:    49 M with no significant cardiovascular history, who p/w neurologic symptoms, CP and palpitations.    Chest pain. No evidence of acute coronary syndrome by serial cardiac enzymes and EKG thus far. Atypical chest pain. No need for further inpatient evaluation at this time.   Palpitations. No events on telemetry.       Cardiac issues stable. Please call with further questions.       Reason For Consultation:   31 y.o. male with a past medical ADHD who presents 12/02/21 with chest pain that awoke him from sleep and syncope episode while seated. Cardiology requested to consult for chest pain.       ASSESSMENT AND PLAN:   Chest pain  -No EKG changes, negative troponins  -LDL 108 - can meet target with diet and exercise  -atypical chest pain not likely cardiac in nature, no identifiable risk factors, negative troponins, no EKG changes, recommend outpatient stress test, reports he is able to walk on treadmill    *palpitations  -reports racing heart beats within 10 minutes prior to our discussion but did not correlate with any arrhythmias on telemetry      CODE Status: FULL    HISTORY OF  PRESENT ILLNESS / SUBJECTIVE:   Kurt Rogers is a 31 y.o. male with a past medical history for ADHD who presents 12/02/21 with chest pain. Patient reported syncope on admission but denies during our discussion today.     Patient reports left-sided chest pain that is report has been occurring over the last 3-4 days that wakes him up at night and occurs with pounding palpitations and last 5-10.  It can occur with exertion but mostly while sleeping, sharp squeezing in nature, 6-8/10 in severity and exacerbated with deep breathing and alleviates with mentally relaxation.  She reports lightheadedness, SOB, nausea, diaphoresis with the occurrences.  He denies jaw pain, neck pain, back pain, fever, cough, calf tenderness or leg pain with these chest pain.     No known risk factors. Denies hypertension, smoking, hyperlipidemia (slightly elevated on labs), family history of early CAD, known stenosis or sedentary lifestyle.     He reports SOB with chest pain and can occur with exertion. He reports palpitations both racing heart with the chest pain and skipped beats at times.      Reported no current tobacco.  Reports 1 alcohol drink per week socially.   Reports marijuana edibles 10 mg THC every 2 weeks for recreational drug use.  Reported (no) premature CAD in the family.    REVIEW OF SYSTEMS:  ARROS, ROSSSSS   General:  No fever, + fatigue  Neuro:  No headache, + focal weakness (left hand and foot, chronic)  Eyes:  + acute visual changes  ENMT:  No epistaxis, no dysphagia  Lungs:  No cough, no wheeze  Cardiovascular : See HPI  Gastrointestinal:  no abdominal pain, no GIB  Genitourinary:  No hematuria, no dysuria  Musculoskeletal:  No arthritis, no falls, + difficulty walking  Skin:  No rash, no bruise  Psychiatric:  No depression, no anxiety   All other systems were reviewed and were negative except as noted in HPI      CARDIAC DIAGNOSTICS:                                          [id number]     Telemetry:  (I independently reviewed the telemetry data) sinus rhythm, bradycardia overnight as low as 46 bpm    ECG (12/02/21 and 12/03/21):  (I independently reviewed the ECG tracing) normal sinus rhythm with PACs    Chest -Xray (12/02/21): no actual thoracic process, cardiomediastinal silhouette is normal in size and appearance.    TEE 01/11/21:  [facility])  The left ventricular systolic function is normal.   Redundant mitral valve without any significant reflux/regurgitation   There is no evidence of mitral valve prolapse. There is no vegetation seen on the   mitral valve. No thrombus is detected in the left atrial appendage    TTE 12/30/20 [facility])  Left ventricular systolic function is normal   Calculated left ventricular ejection fraction is estimated at 77.7%%.   Linear fibrinous mobile echodensity noted on anterior mitral valve leaflet-   this may represent vegetation vs ruptured mitral valve cord.        PAST MEDICAL HISTORY:     Past Medical History:   Diagnosis Date    Neurological abnormality     Numbness     TIA (transient ischemic attack)      Past Surgical History:   Procedure Laterality Date    TONSILLECTOMY         SOCIAL HISTORY:     Social History     Tobacco Use    Smoking status: Never    Smokeless tobacco: Never   Substance Use Topics    Alcohol use: Yes     Comment: socially       FAMILY HISTORY:   family history is not on file.  Reported (no) premature CAD in the family.    PHYSICAL EXAM:                                                        ARPE3, ARPE2, PEEEEE   BP 112/69   Pulse (!) 55   Temp 97.9 F (36.6 C) (Oral)   Resp 18   Ht 1.778 m (5\' 10" )   Wt 99.9 kg (220 lb 2.4 oz)   SpO2 96%   BMI 31.59 kg/m    Intake/Output Summary (Last 24 hours) at 12/04/2021 0935  Last data filed at 12/03/2021 2000  Gross per 24 hour   Intake 140 ml   Output --   Net 140 ml       General Appearance:  Breathing comfortable, no acute distress  Head:   normocephalic  Eyes:  EOM's intact, nonicteric sclera  Neck:  No carotid bruit or jugular venous distension  Lungs:  Clear to auscultation throughout, no wheezes, rhonchi or rales, good respiratory effort, symmetric chest expansion  Cardiac:  RRR, normal S1, S2, no S3, no S4, no rub, no murmurs   Abdomen:  Soft, non-tender, no rebound or guarding, non-distended, positive bowel sounds  Extremities:  No cyanosis or clubbing. No arthritis or deformities. No LE edema  Vascular: 2+ radial and distal pulses bilaterally  Neurologic:  Alert and oriented x3, mood and affect normal    LABORATORY:     CBC w/Diff   Recent Labs   Lab 12/04/21  0902 12/03/21  0355 12/02/21  1502   WBC 6.64 7.68 8.44   Hgb 16.2 15.6 16.0   Hematocrit 46.4 44.7 45.4   Platelets 327 338 339        Basic Metabolic Profile   Recent Labs   Lab 12/04/21  0902 12/04/21  0355 12/03/21  0355   Sodium 142 141 142   Potassium 3.8 4.2 4.0   Chloride 107 111 108   CO2 25 18 23    BUN 18.0 17.0 16.0   Creatinine 1.0 0.9 0.9   EGFR >60.0 >60.0 >60.0   Glucose 128* 92 88   Calcium 9.7 9.6 9.4        Cardiac Enzymes   Recent Labs   Lab 12/02/21  1709 12/02/21  1502   hs Troponin-I <2.7 <2.7   hs Troponin-I Delta Unable toCalc.  --             D-dimer          Thyroid Studies         Invalid input(s): FREET4     Cholesterol Panel   Recent Labs   Lab 12/03/21  0355   Cholesterol 163   Triglycerides 125   HDL 30*   LDL Calculated 108*        Coagulation Studies   Recent Labs   Lab 12/03/21  0355   PT 12.6   PT INR 1.1   PTT 37        MEDICATIONS:    INFUSION MEDS:      SCHEDULED MEDS:     Current Facility-Administered Medications   Medication Dose Route Frequency    amphetamine-dextroamphetamine  10 mg Oral 2XDAY at 0800 & 1200    aspirin  81 mg Oral Daily    atorvastatin  10 mg Oral QHS    heparin (porcine)  5,000 Units Subcutaneous Q12H [facility]      PRN MEDS:   hydrALAZINE, labetalol, morphine

## 2021-12-04 NOTE — Nursing Progress Note (Signed)
Patient discharge received. Discharge instructions explained to patient. Pt advised to follow up with PCP and specialist within 7-10 days for continuation of care. IV removed, telemetry box removed and returned. All belongings gathered and returned to patient. Pt and family verbalized understanding.

## 2021-12-04 NOTE — Progress Notes (Incomplete)
Initial Case Management Assessment and Discharge Planning  [facility]   Patient Name: Kurt Rogers, Kurt Rogers   Date of Birth 06-11-1991   Attending Physician: Clarene Critchley, MD   Primary Care Physician: Gracelyn Nurse, MD   Length of Stay 0   Reason for Consult / Chief Complaint Initial Assessment        Situation   Admission DX:   1. Left-sided weakness    2. Chest pain, unspecified type        A/O Status: X 3    LACE Score: 6    Patient admitted from: ER  Admission Status: observation    Health Care Agent: Self  Name:   Phone number:        Background     Advanced directive:   <no information>    Code Status:   Full Code     Residence: Apartment with roommate, elevator access    PCP: Gracelyn Nurse, MD  Patient Contact:   [phone] (home)     [phone] (mobile)     Emergency contact:   Extended Emergency Contact Information  Primary Emergency Contact: Mabie,Scott   [place]  Mobile Phone: [phone]  Relation: Father      ADL/IADL's: Independent and Assistive Device  Previous Level of function: 6 Modified Independent     DME: Single point cane    Pharmacy:   No Pharmacies Listed    Prescription Coverage: Yes    Home Health: The patient is not currently receiving home health services.    Previous SNF/AR:     COVID Vaccine Status: reports 2 doses    Date First IMM given: n/a  UAI on file?: No  Transport for discharge? Mode of transportation: Sales executive - Family/Friend to drive patient  Agreeable to Home with family post-discharge:  Yes     Assessment   Anticipate HOME no needs vs HH or OP PT. Currently uninsured-new job, insurance starts 2/1. Pt declined TCM. Will follow up with PCP even if that requires out-of-pocket cost.  BARRIERS TO DISCHARGE: uninsured until 2/1     Recommendation   D/C Plan A: Home with family    D/C Plan B: Home with family and Home with home health

## 2021-12-04 NOTE — Plan of Care (Signed)
NURSING SHIFT NOTE     Patient: Kurt Rogers  Day: 0      SHIFT EVENTS     Shift Narrative/Significant Events (PRN med administration, fall, RRT, etc.):         Safety and fall precautions remain in place. Purposeful rounding completed.          ASSESSMENT     Changes in assessment from patient's baseline this shift:    Neuro: No  CV: No  Pulm: No  Peripheral Vascular: No  HEENT: No  GI: No  BM during shift: No, Last BM: Last BM Date: 12/03/21  GU: No   Integ: No  MS: No    Pain: None  Pain Interventions: None  Medications Utilized:     Mobility: PMP Activity: Step 6 - Walks in Room of Distance Walked (ft) (Step 6,7): 30 Feet           Lines     Patient Lines/Drains/Airways Status       Active Lines, Drains and Airways       Name Placement date Placement time Site Days    Peripheral IV 12/02/21 20 G Anterior;Distal;Left Upper Arm 12/02/21  --  Upper Arm  2                         VITAL SIGNS     Vitals:    12/04/21 0803   BP: 112/69   Pulse: (!) 55   Resp: 18   Temp: 97.9 F (36.6 C)   SpO2: 96%       Temp  Min: 97.8 F (36.6 C)  Max: 98.8 F (37.1 C)  Pulse  Min: 55  Max: 86  Resp  Min: 18  Max: 18  BP  Min: 105/69  Max: 149/83  SpO2  Min: 96 %  Max: 97 %      Intake/Output Summary (Last 24 hours) at 12/04/2021 1252  Last data filed at 12/03/2021 2000  Gross per 24 hour   Intake 140 ml   Output --   Net 140 ml          CARE PLAN     Problem: Safety  Goal: Patient will be free from injury during hospitalization  Outcome: Progressing  Flowsheets (Taken 12/04/2021 1250)  Patient will be free from injury during hospitalization:   Assess patient's risk for falls and implement fall prevention plan of care per policy   Use appropriate transfer methods   Include patient/ family/ care giver in decisions related to safety   Assess for patients risk for elopement and implement Elopement Risk Plan per policy   Provide alternative method of communication if needed (communication boards, writing)   Hourly rounding      Problem: Side Effects from Pain Analgesia  Goal: Patient will experience minimal side effects of analgesic therapy  Outcome: Progressing  Flowsheets (Taken 12/04/2021 1250)  Patient will experience minimal side effects of analgesic therapy:   Evaluate for opioid-induced sedation with appropriate assessment tool (i.e. POSS)   Prevent/manage side effects per LIP orders (i.e. nausea, vomiting, pruritus, constipation, urinary retention, etc.)   Monitor/assess patient's respiratory status (RR depth, effort, breath sounds)   Assess for changes in cognitive function     Problem: Every Day - Stroke  Goal: Neurological status is stable or improving  Outcome: Progressing  Flowsheets (Taken 12/04/2021 1250)  Neurological status is stable or improving:   Monitor/assess/document neurological assessment (Stroke: every 4 hours)  Re-assess NIH Stroke Scale for any change in status   Observe for seizure activity and initiate seizure precautions if indicated   Perform CAM Assessment   Monitor/assess NIH Stroke Scale  Goal: Stable vital signs and fluid balance  Outcome: Progressing  Flowsheets (Taken 12/04/2021 1250)  Stable vital signs and fluid balance:   Apply telemetry monitor as ordered   Monitor intake and output. Notify LIP if urine output is < 30 mL/hour.   Monitor and assess vitals every 4 hours or as ordered and hemodynamic parameters   Position patient for maximum circulation/cardiac output   Encourage oral fluid intake  Goal: Patient will maintain adequate oxygenation  Outcome: Progressing  Flowsheets (Taken 12/04/2021 1250)  Patient will maintain adequate oxygenation:   Suction secretions as needed   Sleep Apnea: Assess if previously tested or on CPAP   Maintain SpO2 of greater than 92%

## 2021-12-04 NOTE — Discharge Summary (Signed)
Discharge Summary    Date:12/04/2021   Patient Name: Kurt Rogers  Attending Physician: Clarene Critchley, MD    Date of Admission:   12/02/2021    Date of Discharge:   12/04/2021    Admitting Diagnosis:   Loss of consciousness.  Left lower extremity weakness.  Chest tightness.     Discharge Dx:   Chest pain Atypical "non cardiac"  LOC with Left sided weakness  Conversion disorder    Treatment Team:   Treatment Team:   Attending Provider: Clarene Critchley, MD     Procedures performed:   Radiology: all results from this admission  CT Angiogram Head Neck    Result Date: 12/02/2021  1.  No large vessel intracranial arterial occlusion or high-grade stenosis. 2.  No intracranial aneurysm identified. 3.  No occlusion or significant stenosis of the cervical carotid or vertebral arteries. Christy Gentles MD, MD  12/02/2021 3:27 PM    CT Head WO Contrast    Result Date: 12/02/2021   No acute intracranial process. Sandie Ano, MD  12/02/2021 2:54 PM    CT Angiogram Cerebral Perfusion W 3D Reconstruction    Result Date: 12/02/2021  No evidence of acute large core infarct or substantial volume of ischemic brain tissue at risk. If there is ongoing clinical concern, MRI would be advised. Christy Gentles MD, MD  12/02/2021 3:27 PM    Chest AP Portable    Result Date: 12/02/2021  No acute thoracic process. Aldean Ast, MD  12/02/2021 3:33 PM     Reason for Admission:   Chest pain    Hospital Course:     HPI:  Kurt Rogers is a 31 y.o. male medical history significant for left lower extremity weakness that has been going on for about 2 years.  He had an extensive work-up in the past including MRI of the brain and was told that he does not have MS.  He has tingling sensation and weakness of the left lower leg.  He woke up at about 7 AM today and sit on a chair to do his work at his house and at about 1 PM he found himself on the floor of his hardwood floor room.  He claims that he did not remember anything.  He only  remembers sitting himself at 7 AM to start working which he does work from home.  He has no weakness of the left upper extremity or the right side of his body.  He did not see any trauma or injury on his body.  He does not remember how he ended up on the floor.  Apparently he has lost consciousness according to him for about 6 hours.  He has no chest pain or shortness of breath or dizziness or vertigo or tinnitus.  He was evaluated by neurology at the ED and also had imaging studies which included CT of the head without contrast, CT angiogram of the head and neck, CT angiogram of the cerebral perfusion which is unremarked.    Hospital course:  The patient was admitted to the hospital because of chest pain and left-sided weakness.  The patient was seen by neurology.  Seems like has had extensive work-up in the past for similar symptoms.  With multiple MRIs and C-spine and LPs and serum studies and EEGs that were all normal.  The plan is to follow-up with his neurologist.  By the time I saw the patient he symptoms have resolved completely however he was complaining  of chest pain.  His EKG and troponin were negative.  His telemetry did not show an abnormality.  He was seen by cardiology who recommended outpatient stress test and discharge home.  He was discharged asymptomatic and pain-free.    Condition at Discharge:   Stable    Today:     BP 112/69   Pulse (!) 55   Temp 97.9 F (36.6 C) (Oral)   Resp 18   Ht 1.778 m (5\' 10" )   Wt 98.9 kg (217 lb 15 oz)   SpO2 96%   BMI 31.27 kg/m   Ranges for the last 24 hours:  Temp:  [97.8 F (36.6 C)-98.8 F (37.1 C)] 97.9 F (36.6 C)  Heart Rate:  [55-86] 55  Resp Rate:  [18] 18  BP: (105-149)/(69-83) 112/69    Last set of labs   Recent Labs   Lab 12/04/21  0902   WBC 6.64   Hgb 16.2   Hematocrit 46.4   Platelets 327     Recent Labs   Lab 12/04/21  0902   Sodium 142   Potassium 3.8   Chloride 107   CO2 25   BUN 18.0   Creatinine 1.0   EGFR >60.0   Glucose 128*    Calcium 9.7     Recent Labs   Lab 12/02/21  1502   Bilirubin, Total 0.5   Protein, Total 7.6   Albumin 4.9   ALT 26   AST (SGOT) 20     Recent Labs   Lab 12/03/21  0355   PT 12.6   PT INR 1.1   PTT 37           Invalid input(s): FREET4  Recent Labs   Lab 12/03/21  0355   Cholesterol 163   Triglycerides 125   HDL 30*   LDL Calculated 108*     Recent Labs   Lab 12/02/21  1709 12/02/21  1502   hs Troponin-I <2.7 <2.7   hs Troponin-I Delta Unable toCalc.  --      Microbiology Results (last 15 days)       Procedure Component Value Units Date/Time    COVID-19 (SARS-CoV-2) and Influenza A/B, NAA (Liat Rapid)- Admission [[id number]] Collected: 12/02/21 1502    Order Status: Completed Specimen: Culturette from Nasopharyngeal Updated: 12/02/21 1545     Purpose of COVID testing Diagnostic -PUI     SARS-CoV-2 Specimen Source Nasal Swab     SARS CoV 2 Overall Result Not Detected     Comment: __________________________________________________  -A result of "Detected" indicates POSITIVE for the    presence of SARS CoV-2 RNA  -A result of "Not Detected" indicates NEGATIVE for the    presence of SARS CoV-2 RNA  __________________________________________________________  Test performed using the Roche cobas Liat SARS-CoV-2 assay. This assay is  only for use under the Food and Drug Administration's Emergency Use  Authorization. This is a real-time RT-PCR assay for the qualitative  detection of SARS-CoV-2 RNA. Viral nucleic acids may persist in vivo,  independent of viability. Detection of viral nucleic acid does not imply the  presence of infectious virus, or that virus nucleic acid is the cause of  clinical symptoms. Negative results do not preclude SARS-CoV-2 infection and  should not be used as the sole basis for diagnosis, treatment or other  patient management decisions. Negative results must be combined with  clinical observations, patient history, and/or epidemiological information.  Invalid results may be due to inhibiting  substances  in the specimen and  recollection should occur. Please see Fact Sheets for patients and providers  located:  WirelessDSLBlog.no          Influenza A Not Detected     Influenza B Not Detected     Comment: Test performed using the Roche cobas Liat SARS-CoV-2 & Influenza A/B assay.  This assay is only for use under the Food and Drug Administration's  Emergency Use Authorization. This is a multiplex real-time RT-PCR assay  intended for the simultaneous in vitro qualitative detection and  differentiation of SARS-CoV-2, influenza A, and influenza B virus RNA. Viral  nucleic acids may persist in vivo, independent of viability. Detection of  viral nucleic acid does not imply the presence of infectious virus, or that  virus nucleic acid is the cause of clinical symptoms. Negative results do  not preclude SARS-CoV-2, influenza A, and/or influenza B infection and  should not be used as the sole basis for diagnosis, treatment or other  patient management decisions. Negative results must be combined with  clinical observations, patient history, and/or epidemiological information.  Invalid results may be due to inhibiting substances in the specimen and  recollection should occur. Please see Fact Sheets for patients and providers  located: http://www.rice.biz/.         Narrative:      o Collect and clearly label specimen type:  o PREFERRED-Upper respiratory specimen: One Nasal Swab in  Transport Media.  o Hand deliver to laboratory ASAP  Diagnostic -PUI            Micro / Labs / Path pending:     Unresulted Labs       None            Discharge Instructions For Providers      Follow with Cardiology and Neurology        Discharge Instructions:      Follow-up Information       Gracelyn Nurse, MD Follow up in 1 week(s).    Specialty: Internal Medicine  Contact information:  [address] [place]  [address]  [place] [address]  [phone]                             Discharge Diet:  Regular Diet        Peripheral IV 12/02/21 20 G Anterior;Distal;Left Upper Arm (Active)   Site Assessment Clean;Dry;Intact 12/03/21 2000   Line Status Saline Locked;Flushed 12/03/21 2000   Dressing Status Clean;Dry;Intact 12/03/21 2000   Dressing Dated? Yes 12/03/21 2000   Dressing Change Due 12/08/21 12/03/21 2000   Reason Not Rotated Not due 12/03/21 2000   Number of days: 2          There is no immunization history on file for this patient.  Extended Emergency Contact Information  Primary Emergency Contact: Wingrove,Scott   [place]  Mobile Phone: [phone]  Relation: Father  Full Code  Activity/Weight bearing status: As tolerated   Discharge References/Attachments    None       Disposition:  Home or Self Care     Discharge Medication List        Taking      Adderall XR 20 MG 24 hr capsule  Generic drug: amphetamine-dextroamphetamine  Adderall XR 20 mg capsule,extended release   TAKE 1 CAPSULE BY MOUTH EVERY DAY IN THE MORNING            Minutes spent coordinating  discharge and reviewing discharge plan: 33 minutes      Signed by: Meoshia Billing Larose Hires, MD

## 2021-12-04 NOTE — Discharge Instr - AVS First Page (Addendum)
Reason for your Hospital Admission:  Atypical chest pain      Instructions for after your discharge:  Follow up with your cardiologist in the next 2 weeks    Discuss with your cardiologist about getting an outpatient stress test

## 2021-12-04 NOTE — Plan of Care (Signed)
NURSING SHIFT NOTE     Patient: Kurt Rogers  Day: 0      SHIFT EVENTS     Shift Narrative/Significant Events (PRN med administration, fall, RRT, etc.):   ALERT,ORIENTED x4,patienytvitally stable,comfortable.NIH 0 ,Safety measured followed.  Clinically cleared for discharge.all meds  and appointmens explained by the discharge  nurse.NIH done.patient discharged to home.  Safety and fall precautions remain in place. Purposeful rounding completed.          ASSESSMENT     Changes in assessment from patient's baseline this shift:    Neuro: No  CV: No  Pulm: No  Peripheral Vascular: No  HEENT: No  GI: No  BM during shift: No, Last BM: Last BM Date: 12/03/21  GU: No   Integ: No  MS: No    Pain: None  Pain Interventions:   Medications Utilized:     Mobility: PMP Activity: Step 6 - Walks in Room of Distance Walked (ft) (Step 6,7): 30 Feet           Lines     Patient Lines/Drains/Airways Status       Active Lines, Drains and Airways       Name Placement date Placement time Site Days    Peripheral IV 12/02/21 20 G Anterior;Distal;Left Upper Arm 12/02/21  --  Upper Arm  2                         VITAL SIGNS     Vitals:    12/04/21 1551   BP: 103/66   Pulse: 76   Resp: 18   Temp: 98.6 F (37 C)   SpO2: 95%       Temp  Min: 97.8 F (36.6 C)  Max: 98.8 F (37.1 C)  Pulse  Min: 55  Max: 86  Resp  Min: 18  Max: 18  BP  Min: 103/66  Max: 125/81  SpO2  Min: 95 %  Max: 97 %      Intake/Output Summary (Last 24 hours) at 12/04/2021 1719  Last data filed at 12/04/2021 1400  Gross per 24 hour   Intake 140 ml   Output 600 ml   Net -460 ml          CARE PLAN     Problem: Safety  Goal: Patient will be free from injury during hospitalization  Outcome: Progressing  Flowsheets (Taken 12/04/2021 1250)  Patient will be free from injury during hospitalization:   Assess patient's risk for falls and implement fall prevention plan of care per policy   Use appropriate transfer methods   Include patient/ family/ care giver in decisions related  to safety   Assess for patients risk for elopement and implement Elopement Risk Plan per policy   Provide alternative method of communication if needed (communication boards, writing)   Hourly rounding     Problem: Side Effects from Pain Analgesia  Goal: Patient will experience minimal side effects of analgesic therapy  Outcome: Progressing  Flowsheets (Taken 12/04/2021 1250)  Patient will experience minimal side effects of analgesic therapy:   Evaluate for opioid-induced sedation with appropriate assessment tool (i.e. POSS)   Prevent/manage side effects per LIP orders (i.e. nausea, vomiting, pruritus, constipation, urinary retention, etc.)   Monitor/assess patient's respiratory status (RR depth, effort, breath sounds)   Assess for changes in cognitive function     Problem: Every Day - Stroke  Goal: Neurological status is stable or improving  12/04/2021 1718 by  Gayla Medicus, RN  Flowsheets (Taken 12/04/2021 1718)  Neurological status is stable or improving:   Monitor/assess/document neurological assessment (Stroke: every 4 hours)   Re-assess NIH Stroke Scale for any change in status   Perform CAM Assessment   Observe for seizure activity and initiate seizure precautions if indicated  12/04/2021 1250 by Gayla Medicus, RN  Outcome: Progressing  Flowsheets (Taken 12/04/2021 1250)  Neurological status is stable or improving:   Monitor/assess/document neurological assessment (Stroke: every 4 hours)   Re-assess NIH Stroke Scale for any change in status   Observe for seizure activity and initiate seizure precautions if indicated   Perform CAM Assessment   Monitor/assess NIH Stroke Scale  Goal: Stable vital signs and fluid balance  Outcome: Progressing  Flowsheets (Taken 12/04/2021 1250)  Stable vital signs and fluid balance:   Apply telemetry monitor as ordered   Monitor intake and output. Notify LIP if urine output is < 30 mL/hour.   Monitor and assess vitals every 4 hours or as ordered and hemodynamic parameters   Position  patient for maximum circulation/cardiac output   Encourage oral fluid intake  Goal: Patient will maintain adequate oxygenation  Outcome: Progressing  Flowsheets (Taken 12/04/2021 1250)  Patient will maintain adequate oxygenation:   Suction secretions as needed   Sleep Apnea: Assess if previously tested or on CPAP   Maintain SpO2 of greater than 92%     Problem: Day of Discharge - Stroke  Goal: Able to express understanding of discharge instructions and stroke education  Outcome: Progressing  Flowsheets (Taken 12/04/2021 1716)  Able to express understanding of discharge instructions and stroke education:   Provide alternative method of communication if needed   Include patient care companion in decisions related to communication   Patient/patient care companion demonstrates understanding on disease process, treatment plan, medications and discharge plan   Ensure "Discharge instructions: Stroke" appears on discharge paperwork  (After Visit Summary-AVS)   Consult/collaborate with Case Management/Social Work  Goal: Core/Quality measures - Discharge  Outcome: Progressing  Flowsheets (Taken 12/04/2021 1716)  Core/Quality measures - Discharge:   If diagnosis or history of A-fib/A-flutter, ensure anticoagulation ordered or contraindication documented by LIP   Ensure antithrombotic ordered or contraindication documented by LIP   Document discharge NIH Stroke Scale   Complete final verification medications: check and compare prior to admit (PTA), during hospitalization and discharge medication list   Document discharge stroke education completed and patient/family verbalizes understanding   Document Modified Rankin Scale ([place] only)   Ensure statin ordered or contraindication documented by LIP     Problem: Moderate/High Fall Risk Score >5  Goal: Patient will remain free of falls  Flowsheets (Taken 12/04/2021 1250)  Moderate Risk (6-13):   MOD- Consider video monitoring   MOD-Use gait belt when appropriate   MOD-Re-orient  confused patients   MOD-Perform dangle, stand, walk (DSW) prior to mobilization   MOD-include family in multidisciplinary POC discussions   MOD-Request PT/OT consult order for patients with gait/mobility impairment   MOD-Utilize diversion activities   MOD-Place bedside commode and assistive devices out of sight when not in use   MOD-Remain with patient during toileting   MOD-Consider a move closer to Nurses Station     Problem: Neurological Deficit  Goal: Neurological status is stable or improving  12/04/2021 1718 by Gayla Medicus, RN  Flowsheets (Taken 12/04/2021 1718)  Neurological status is stable or improving:   Monitor/assess/document neurological assessment (Stroke: every 4 hours)   Re-assess NIH Stroke Scale for any change in  status   Perform CAM Assessment   Observe for seizure activity and initiate seizure precautions if indicated  12/04/2021 1250 by Gayla Medicus, RN  Outcome: Progressing  Flowsheets (Taken 12/04/2021 1250)  Neurological status is stable or improving:   Monitor/assess/document neurological assessment (Stroke: every 4 hours)   Re-assess NIH Stroke Scale for any change in status   Observe for seizure activity and initiate seizure precautions if indicated   Perform CAM Assessment   Monitor/assess NIH Stroke Scale

## 2021-12-08 ENCOUNTER — Emergency Department: Payer: Self-pay

## 2021-12-08 ENCOUNTER — Emergency Department
Admission: EM | Admit: 2021-12-08 | Discharge: 2021-12-08 | Disposition: A | Discharge status: Home / Self Care | Payer: Self-pay | Attending: Emergency Medicine | Admitting: Emergency Medicine

## 2021-12-08 DIAGNOSIS — R079 Chest pain, unspecified: Secondary | ICD-10-CM | POA: Insufficient documentation

## 2021-12-08 DIAGNOSIS — R55 Syncope and collapse: Secondary | ICD-10-CM | POA: Insufficient documentation

## 2021-12-08 LAB — COMPREHENSIVE METABOLIC PANEL
ALT: 22 U/L (ref 0–55)
AST (SGOT): 16 U/L (ref 5–41)
Albumin/Globulin Ratio: 1.7 (ref 0.9–2.2)
Albumin: 4.3 g/dL (ref 3.5–5.0)
Alkaline Phosphatase: 75 U/L (ref 37–117)
Anion Gap: 11 (ref 5.0–15.0)
BUN: 12 mg/dL (ref 9.0–28.0)
Bilirubin, Total: 0.3 mg/dL (ref 0.2–1.2)
CO2: 25 mEq/L (ref 17–29)
Calcium: 9.8 mg/dL (ref 8.5–10.5)
Chloride: 106 mEq/L (ref 99–111)
Creatinine: 0.9 mg/dL (ref 0.5–1.5)
Globulin: 2.5 g/dL (ref 2.0–3.6)
Glucose: 82 mg/dL (ref 70–100)
Potassium: 4.2 mEq/L (ref 3.5–5.3)
Protein, Total: 6.8 g/dL (ref 6.0–8.3)
Sodium: 142 mEq/L (ref 135–145)

## 2021-12-08 LAB — CBC AND DIFFERENTIAL
Absolute NRBC: 0 10*3/uL (ref 0.00–0.00)
Basophils Absolute Automated: 0.05 10*3/uL (ref 0.00–0.08)
Basophils Automated: 0.6 %
Eosinophils Absolute Automated: 0.18 10*3/uL (ref 0.00–0.44)
Eosinophils Automated: 2.2 %
Hematocrit: 42.8 % (ref 37.6–49.6)
Hgb: 15 g/dL (ref 12.5–17.1)
Immature Granulocytes Absolute: 0.01 10*3/uL (ref 0.00–0.07)
Immature Granulocytes: 0.1 %
Instrument Absolute Neutrophil Count: 3.39 10*3/uL (ref 1.10–6.33)
Lymphocytes Absolute Automated: 3.75 10*3/uL — ABNORMAL HIGH (ref 0.42–3.22)
Lymphocytes Automated: 46.6 %
MCH: 27.3 pg (ref 25.1–33.5)
MCHC: 35 g/dL (ref 31.5–35.8)
MCV: 78 fL (ref 78.0–96.0)
MPV: 9.4 fL (ref 8.9–12.5)
Monocytes Absolute Automated: 0.66 10*3/uL (ref 0.21–0.85)
Monocytes: 8.2 %
Neutrophils Absolute: 3.39 10*3/uL (ref 1.10–6.33)
Neutrophils: 42.3 %
Nucleated RBC: 0 /100 WBC (ref 0.0–0.0)
Platelets: 330 10*3/uL (ref 142–346)
RBC: 5.49 10*6/uL (ref 4.20–5.90)
RDW: 12 % (ref 11–15)
WBC: 8.04 10*3/uL (ref 3.10–9.50)

## 2021-12-08 LAB — GFR: EGFR: >60

## 2021-12-08 LAB — HIGH SENSITIVITY TROPONIN-I: hs Troponin-I: <2.7 ng/L

## 2021-12-08 NOTE — EDIE (Signed)
COLLECTIVE?NOTIFICATION?12/08/2021 16:23?Kurt Rogers, Kurt Rogers?MRN: [id number]    Criteria Met      5 ED Visits in 12 Months    Security and Safety  No Security Events were found.  ED Care Guidelines  There are currently no ED Care Guidelines for this patient. Please check your facility's medical records system.    Flags      Negative COVID-19 Lab Result - [facility] - A specimen collected from this patient was negative for COVID-19 / Attributed By: [place] [facility] / Attributed On: 12/04/2021       Prescription Monitoring Program  000??- Narcotic Use Score  000??- Sedative Use Score  100??- Stimulant Use Score  000??- Overdose Risk Score  - All Scores range from 000-999 with 75% of the population scoring < 200 and on 1% scoring above 650  - The last digit of the narcotic, sedative, and stimulant score indicates the number of active prescriptions of that type  - Higher Use scores correlate with increased prescribers, pharmacies, mg equiv, and overlapping prescriptions  - Higher Overdose Risk Scores correlate with increased risk of unintentional overdose death   Concerning or unexpectedly high scores should prompt a review of the PMP record; this does not constitute checking PMP for prescribing purposes.    E.D. Visit Count (12 mo.)  Facility Visits   [facility] 1   [facility] 1   [facility] 3   Total 5   Note: Visits indicate total known visits.     Recent Emergency Department Visit Summary  Date Facility [facility] Type Diagnoses or Chief Complaint    Dec 08, 2021  [facility] [place] [facility]  [name]  [place]  Emergency      Chest Pain - SOB      Dec 02, 2021  [place] [facility] [place] [facility]  [name]  [place]  Emergency      Chest Pain      Syncope      Weakness      Chest pain, unspecified      Jul 06, 2021  [place] [facility] [place] [facility]  [name]  [place]  Emergency      prev stroke last week-blood leakage/blurry vision      Blurred Vision      Other visual disturbances      Jul 01, 2021  [facility] [place]  [place]  Emergency     Mar 11, 2021  [facility] [place]  [place]  Emergency      numbness      HAND NUMBNESS        Recent Inpatient Visit Summary  Date Facility [facility] Type Diagnoses or Chief Complaint    Jul 01, 2021  [facility] [place]  [place]  Inpatient     Mar 11, 2021  [facility] [place]  [place]  [place]  General Medicine      Weakness      Foot drop, left foot        Care Team  Provider Specialty Phone Fax Service Dates   Apolinar Junes, MD Family Medicine   Current      Collective Portal  This patient has registered at the [facility] Emergency Department   For more information visit: [email or web address]     PLEASE NOTE:     1.   Any care recommendations and other clinical information are provided as guidelines or for historical purposes  only, and providers should exercise their own clinical judgment when providing care.    2.   You may only use this information for purposes of treatment, payment or health care operations activities, and subject to the limitations of applicable [employer] Policies.    3.   You should consult directly with the organization that provided a care guideline or other clinical history with any questions about additional information or accuracy or completeness of information provided.    ? 2023 [employer] - https://craig.com/

## 2021-12-08 NOTE — ED Triage Notes (Signed)
Pt with c/o left chest wall that began this morning that progressively worsened throughout the day. Pt denies radiation of CP. Pt states dizziness, SOB. Pt states pain is 5/10 and feels like tight, pressure. Pt states taking x2 ASA 81mg .

## 2021-12-08 NOTE — ED Triage Notes (Signed)
[facility] EMERGENCY DEPARTMENT Provider in Triage Note        Patient Name: Kurt Rogers    Chief Complaint:   Chief Complaint   Patient presents with    Shortness of Breath    Dizziness    Chest Pain       HPI: Jeray Shugart is a 31 y.o. male, who has had a rapid medical screening evaluation initiated by myself for the chief complaint of non-radiating left-sided CP since this morning, worsened 1.5 hours PTA. Describes the pain as a tight pressure. States he was cooking food when it began. Pt also endorses dizziness and SOB. Denies N/V, diarrhea, fevers. 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  Hx of TIA. Took aspirin PTA.     ROS: Please see HPI.     Medical/Surgical/Social history: as per HPI    Vitals: BP 133/77   Pulse (!) 52   Temp 98.2 F (36.8 C) (Oral)   Resp 17   SpO2 98%     Pertinent brief exam:   Constitutional: No acute distress.Well-nourished.  Cardiovascular: Normal heart rate.   Pulmonary: Normal effort.  No respiratory distress. No stridor.  Skin: Normal skin color. No diaphoresis.  Psych: Normal affect.     Additional pertinent physical exam findings: none    Preliminary orders: ekg, labs, cxr    Symptom based diagnosis: chest pain       Patient advised to remain in the ED until further evaluation can be performed. Patient instructed to notify staff of any changes in condition while waiting.    This assessment is an initial evaluation to expedite care.

## 2021-12-11 LAB — ECG 12-LEAD
Atrial Rate: 64 {beats}/min
IHS MUSE NARRATIVE AND IMPRESSION: NORMAL
P Axis: 51 degrees
P-R Interval: 170 ms
Q-T Interval: 376 ms
QRS Duration: 96 ms
QTC Calculation (Bezet): 387 ms
R Axis: 70 degrees
T Axis: 21 degrees
Ventricular Rate: 64 {beats}/min

## 2021-12-11 NOTE — ED Provider Notes (Signed)
ED PHYSICIAN NOTE              Patient: Kurt Rogers   MRN:  [id number]          History of Present Illness            Chief Complaint:   Chief Complaint   Patient presents with    Shortness of Breath    Dizziness    Chest Pain         Kurt Rogers is a 31 y.o. male with a past medical history of TIA, left-sided weakness (uses cane for support) presenting to the ED with left chest wall pain that began while at rest this morning, worsened throughout the day.  Patient reports that he also had a syncopal episode at home.  States that he fell to the ground and then "napped".  Chest pain is 5 out of 10 in intensity, feels like a tight pressure.  Took 162 mg aspirin prehospital.  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  Symptoms are nonexertional in nature, nonradiating.  No history of hypertension hyperlipidemia or diabetes.  No family history of coronary artery disease or stroke in parents or siblings.  Does not smoke.         Medical Decision Making      I am the first provider for this patient.     I reviewed the vital signs, available nursing notes, past medical history, past surgical history, family history and social history.     Vital Signs: Reviewed the patient's vital signs during ED stay.  I reviewed pt's pulse oxymetry and cardiac monitor values, as relevant to the case.     Pulse Oximetry Analysis: 98% on room air  Telemetry: Sinus rhythm, bradycardic in 50s, no ectopy     Record Review: The following, if applicable to the case, were reviewed and noted:    Old medical records.  Nursing notes.  Outside records.     Pertinent record review:  Recent hospitalization from 12/02/2021 to 12/04/2021 at [facility] for loss of consciousness, left lower extremity weakness and chest tightness.  Evaluated extensively by neurology as well as cardiology, chest pain thought to be noncardiac in nature, also diagnosed with conversion  disorder.    EKG:  Interpreted by the Emergency Physician.  Sinus rhythm with sinus arrhythmia, rate of 64, normal axis, no acute ST-T segment change in comparison to previous dated December 03, 2021      H+P as documented, VS reviewed.   31 y.o. pt presenting with above.     History obtained from: Patient, chart review    Pertinent vitals: Normal for age    Differential and clinical decision-making: Low clinical suspicion for anginal chest pain.  Low risk by Anner Crete, PERC negative, doubt PE.  Symptoms or not consistent with aortic dissection or aneurysm, pneumonia, pneumothorax.  Etiology of syncopal episode is unclear though I have low clinical suspicion for cardiogenic syncope at this time.    Labs: Reviewed contemporaneously  Troponin negative  CBC without leukocytosis or anemia  CMP nonfocal    Imaging: Reviewed contemporaneously  Chest x-ray on my interpretation shows no cardiomegaly, consolidation, effusion, mediastinal widening, pneumothorax    Re-evaluation and specialist consultation (if indicated): Completed  Patient's examination and blood work are all reassuring  Heart score of 0  Recently evaluated by cardiology inpatient  Reassurance provided, plan for PCP follow-up  Diagnosis and disposition:  Acute chest pain  Syncope  Discharged home      The above-stated information was discussed with pt at bedside.  All questions and concerns were addressed prior to disposition.       Physical Exam           Vitals:    12/08/21 1955   BP: 128/83   Pulse: (!) 55   Resp: 16   Temp: 97.6 F (36.4 C)   SpO2: 98%         Physical Exam  Constitutional: WDWN NAD  HENT: conjunctiva normal, moist mucous membranes  Neck: supple, no cervical LAD  Respiratory: CTAB AP, even unlabored respirations   Cardiovascular: RRR no MGR, capillary refill <2 seconds, no bipedal edema  Abdomen: soft, non-tender, non-distended  Neuro: A+Ox3, answers questions appropriately. CN 2-12 intact. Motor strength and sensation to pinprick intact  ble/bue. He has 4/5 motor strength against resistance LUE and LLE (this is chronic). Normal finger to nose and heel to shin. No dysarthria or aphasia. PERRLA, EOMI. No visual field deficits, no elicitable nystagmus. Ambulates with cane with steady gait.   Skin: Warm and dry, no rashes  Psych: Normal mood and affect          Diagnosis and Disposition          1. Acute chest pain        2. Vasovagal syncope            ED Disposition       ED Disposition   Discharge    Condition   --    Date/Time   Mon Dec 08, 2021  7:41 PM    Comment   Kurt Rogers discharge to home/self care.    Condition at disposition: Stable                        Past History        Past medical history:  Past Medical History:   Diagnosis Date    Neurological abnormality     Numbness     TIA (transient ischemic attack)      Past surgical history:  Past Surgical History:   Procedure Laterality Date    TONSILLECTOMY       Family history:  No family history on file.  Social history:   Social History     Tobacco Use    Smoking status: Never    Smokeless tobacco: Never   Substance Use Topics    Alcohol use: Yes     Comment: socially    Drug use: No     Allergies: Reviewed and up-to-date in pt's chart       Allergies and Medications          Allergies   Allergen Reactions    Latex          Home Medications               amphetamine-dextroamphetamine (Adderall XR) 20 MG 24 hr capsule     Adderall XR 20 mg capsule,extended release   TAKE 1 CAPSULE BY MOUTH EVERY DAY IN THE MORNING                 Review of Systems        Other than pertinent positives as above in HPI, a complete 10-system ROS was reviewed and negative.       Diagnostic Studies  Labs:  Labs Reviewed   CBC AND DIFFERENTIAL - Abnormal; Notable for the following components:       Result Value    Lymphocytes Absolute Automated 3.75 (*)     All other components within normal limits   COMPREHENSIVE METABOLIC PANEL   HIGH SENSITIVITY TROPONIN-I   GFR         Radiological  Studies:  CT Angiogram Head Neck    Result Date: 12/02/2021  CTA HEAD AND NECK HISTORY: Left-sided weakness. COMPARISON: Same day noncontrast head CT and CT perfusion study. TECHNIQUE: Axial CT images were obtained from the skull vertex to the superior thoracic aperture in early arterial phase after intravenous administration of 45 mL Omnipaque 350 iodinated contrast. Reformatted images in the coronal and sagittal planes were generated. 3-D images were generated and reviewed on a separate workstation. CT scanning at this site utilizes multiple dose reduction techniques, including automatic exposure control, adjustment of the mA and/or kV according to patient size, and iterative reconstruction technique. Carotid stenoses are estimated using NASCET criteria. Viz postprocessing software was used to assess for large vessel occlusion. FINDINGS: NECK: Aortic arch: Patent. Normal caliber. Right carotid artery: No occlusion or significant (50% or greater) stenosis. Left carotid artery: No occlusion or significant (50% or greater) stenosis. Vertebral arteries: Codominant. No occlusion or high-grade stenosis. Mild luminal contour irregularity of the distal V2 segments largely corresponds with areas of streak on coronal and sagittal reformatted images and is, therefore, favored artifactual. HEAD: Minimal diffuse arterial contour irregularity without significant focal beading or narrowing, favored artifactual. Anterior circulation: No occlusion or high-grade stenosis. No aneurysm identified. Posterior circulation: No occlusion or high-grade stenosis. Duplicated superior cerebellar arteries. No aneurysm identified. Dural venous sinuses: Patent.     1.  No large vessel intracranial arterial occlusion or high-grade stenosis. 2.  No intracranial aneurysm identified. 3.  No occlusion or significant stenosis of the cervical carotid or vertebral arteries. Christy Gentles MD, MD  12/02/2021 3:27 PM    XR Chest 2 Views    Result Date:  12/08/2021  Exam:PA and lateral chest x-rays INDICATION:Chest pain COMPARISON:12/02/2021 FINDINGS:Heart and mediastinal contours are unremarkable. Lungs are clear of active disease. There are no pleural effusion. There are no acute osseous findings.     No acute process. Gretchen Portela, MD  12/08/2021 5:12 PM    CT Head WO Contrast    Result Date: 12/02/2021  CT HEAD WO CONTRAST CLINICAL INDICATION:   L sided weakness COMPARISON: None TECHNIQUE: 5 mm axial images from the skull base to the vertex. The following ?dose reduction techniques were utilized: automated exposure control and/or adjustment of the mA and/or kV according to patient size, and the use of iterative reconstruction technique. FINDINGS: The ventricles, cisterns, and sulci appear within normal size limits. There is no mass effect or midline shift. There is no hemorrhage or abnormal extra-axial fluid collection. The gray-white differentiation appears maintained. Bone windows demonstrate no evidence for acute osseous abnormality. The included paranasal sinuses and mastoid air cells appear clear.      No acute intracranial process. Sandie Ano, MD  12/02/2021 2:54 PM    CT Angiogram Cerebral Perfusion W 3D Reconstruction    Result Date: 12/02/2021  CT ANGIOGRAM CEREBRAL PERFUSION WITH 3-D RECONSTRUCTION HISTORY: Neuro deficit, acute, stroke suspected. Left-sided weakness. COMPARISON: Same day noncontrast head CT and CTA of the head and neck.. TECHNIQUE: Repeat axial CT images of the brain were obtained after intravenous administration of 45 mL Omnipaque 350 iodinated contrast  material. Viz postprocessing software was used to generate CBV, CBF, MTT, and Tmax maps. FINDINGS: CBF < 30%: 0 mL. Tmax > 6 seconds: 0 mL. Mismatch volume: 0 mL. Mismatch ratio: N/A. Hypoperfusion index: 0. CBV index: N/A. On visual view of the perfusion maps, perfusion appears relatively symmetric.     No evidence of acute large core infarct or substantial volume of ischemic  brain tissue at risk. If there is ongoing clinical concern, MRI would be advised. Christy Gentles MD, MD  12/02/2021 3:27 PM    Chest AP Portable    Result Date: 12/02/2021  INDICATION: syncope cp TECHNIQUE: Portable AP radiograph of the chest. COMPARISON: Chest radiograph dated 07/06/2021. FINDINGS: Cardiomediastinal silhouette is normal in size and appearance.  Lungs are clear. Pleural spaces are clear. No pneumothorax.  Upper abdomen and regional osseous structures demonstrate no acute abnormality.     No acute thoracic process. Aldean Ast, MD  12/02/2021 3:33 PM          Marlis Edelson, DO  12/11/21 2050

## 2021-12-21 ENCOUNTER — Emergency Department: Payer: No Typology Code available for payment source

## 2021-12-21 ENCOUNTER — Emergency Department
Admission: EM | Admit: 2021-12-21 | Discharge: 2021-12-21 | Disposition: A | Discharge status: Home / Self Care | Payer: No Typology Code available for payment source | Attending: Emergency Medicine | Admitting: Emergency Medicine

## 2021-12-21 DIAGNOSIS — R079 Chest pain, unspecified: Secondary | ICD-10-CM | POA: Insufficient documentation

## 2021-12-21 DIAGNOSIS — Z20822 Contact with and (suspected) exposure to covid-19: Secondary | ICD-10-CM | POA: Insufficient documentation

## 2021-12-21 DIAGNOSIS — R197 Diarrhea, unspecified: Secondary | ICD-10-CM | POA: Insufficient documentation

## 2021-12-21 DIAGNOSIS — Z8673 Personal history of transient ischemic attack (TIA), and cerebral infarction without residual deficits: Secondary | ICD-10-CM | POA: Insufficient documentation

## 2021-12-21 DIAGNOSIS — R1110 Vomiting, unspecified: Secondary | ICD-10-CM | POA: Insufficient documentation

## 2021-12-21 LAB — COMPREHENSIVE METABOLIC PANEL
ALT: 28 U/L (ref 0–55)
AST (SGOT): 18 U/L (ref 5–41)
Albumin/Globulin Ratio: 1.6 (ref 0.9–2.2)
Albumin: 4.4 g/dL (ref 3.5–5.0)
Alkaline Phosphatase: 69 U/L (ref 37–117)
Anion Gap: 11 (ref 5.0–15.0)
BUN: 17 mg/dL (ref 9.0–28.0)
Bilirubin, Total: 0.8 mg/dL (ref 0.2–1.2)
CO2: 25 mEq/L (ref 17–29)
Calcium: 9.4 mg/dL (ref 8.5–10.5)
Chloride: 106 mEq/L (ref 99–111)
Creatinine: 0.9 mg/dL (ref 0.5–1.5)
Globulin: 2.7 g/dL (ref 2.0–3.6)
Glucose: 100 mg/dL (ref 70–100)
Potassium: 4.2 mEq/L (ref 3.5–5.3)
Protein, Total: 7.1 g/dL (ref 6.0–8.3)
Sodium: 142 mEq/L (ref 135–145)

## 2021-12-21 LAB — URINALYSIS REFLEX TO MICROSCOPIC EXAM - REFLEX TO CULTURE
Blood, UA: NEGATIVE
Glucose, UA: NEGATIVE
Ketones UA: NEGATIVE
Leukocyte Esterase, UA: NEGATIVE
Nitrite, UA: NEGATIVE
Protein, UR: 30 — AB
Specific Gravity UA: >=1.03 (ref 1.001–1.035)
Urine pH: 6 (ref 5.0–8.0)
Urobilinogen, UA: 0.2 mg/dL (ref 0.2–2.0)

## 2021-12-21 LAB — CBC AND DIFFERENTIAL
Absolute NRBC: 0 10*3/uL (ref 0.00–0.00)
Basophils Absolute Automated: 0.02 10*3/uL (ref 0.00–0.08)
Basophils Automated: 0.2 %
Eosinophils Absolute Automated: 0 10*3/uL (ref 0.00–0.44)
Eosinophils Automated: 0 %
Hematocrit: 46.9 % (ref 37.6–49.6)
Hgb: 16.6 g/dL (ref 12.5–17.1)
Immature Granulocytes Absolute: 0.03 10*3/uL (ref 0.00–0.07)
Immature Granulocytes: 0.3 %
Instrument Absolute Neutrophil Count: 9.84 10*3/uL — ABNORMAL HIGH (ref 1.10–6.33)
Lymphocytes Absolute Automated: 0.87 10*3/uL (ref 0.42–3.22)
Lymphocytes Automated: 7.7 %
MCH: 27.8 pg (ref 25.1–33.5)
MCHC: 35.4 g/dL (ref 31.5–35.8)
MCV: 78.6 fL (ref 78.0–96.0)
MPV: 9.8 fL (ref 8.9–12.5)
Monocytes Absolute Automated: 0.59 10*3/uL (ref 0.21–0.85)
Monocytes: 5.2 %
Neutrophils Absolute: 9.84 10*3/uL — ABNORMAL HIGH (ref 1.10–6.33)
Neutrophils: 86.6 %
Nucleated RBC: 0 /100 WBC (ref 0.0–0.0)
Platelets: 314 10*3/uL (ref 142–346)
RBC: 5.97 10*6/uL — ABNORMAL HIGH (ref 4.20–5.90)
RDW: 12 % (ref 11–15)
WBC: 11.35 10*3/uL — ABNORMAL HIGH (ref 3.10–9.50)

## 2021-12-21 LAB — COVID-19 (SARS-COV-2) & INFLUENZA  A/B, NAA (ROCHE LIAT)
Influenza A: NOT DETECTED
Influenza B: NOT DETECTED
SARS CoV 2 Overall Result: NOT DETECTED

## 2021-12-21 LAB — HIGH SENSITIVITY TROPONIN-I: hs Troponin-I: <2.7 ng/L

## 2021-12-21 LAB — GFR: EGFR: >60

## 2021-12-21 MED ORDER — ACETAMINOPHEN 500 MG PO TABS
1000.0000 mg | ORAL_TABLET | Freq: Once | ORAL | Status: AC
Start: 2021-12-21 — End: 2021-12-21
  Administered 2021-12-21: 1000 mg via ORAL
  Filled 2021-12-21: qty 1
  Filled 2021-12-21: qty 2

## 2021-12-21 MED ORDER — ONDANSETRON HCL 4 MG/2ML IJ SOLN
4.0000 mg | Freq: Once | INTRAMUSCULAR | Status: AC
Start: 2021-12-21 — End: 2021-12-21
  Administered 2021-12-21: 4 mg via INTRAVENOUS
  Filled 2021-12-21: qty 2

## 2021-12-21 MED ORDER — SODIUM CHLORIDE 0.9 % IV BOLUS
1000.0000 mL | Freq: Once | INTRAVENOUS | Status: AC
Start: 2021-12-21 — End: 2021-12-21
  Administered 2021-12-21: 1000 mL via INTRAVENOUS

## 2021-12-21 MED ORDER — ONDANSETRON 4 MG PO TBDP
4.0000 mg | ORAL_TABLET | Freq: Four times a day (QID) | ORAL | 0 refills | Status: DC | PRN
Start: 2021-12-21 — End: 2023-02-16

## 2021-12-21 NOTE — EDIE (Signed)
COLLECTIVE?NOTIFICATION?12/21/2021 09:34?Kurt HutchinsonSNYDER, Kurt M?MRN: [id number]    Criteria Met      5 ED Visits in 12 Months    Security and Safety  No Security Events were found.  ED Care Guidelines  There are currently no ED Care Guidelines for this patient. Please check your facility's medical records system.    Flags      Negative COVID-19 Lab Result - [facility] - A specimen collected from this patient was negative for COVID-19 / Attributed By: [place] [facility] / Attributed On: 12/04/2021       Prescription Monitoring Program  000??- Narcotic Use Score  000??- Sedative Use Score  100??- Stimulant Use Score  000??- Overdose Risk Score  - All Scores range from 000-999 with 75% of the population scoring < 200 and on 1% scoring above 650  - The last digit of the narcotic, sedative, and stimulant score indicates the number of active prescriptions of that type  - Higher Use scores correlate with increased prescribers, pharmacies, mg equiv, and overlapping prescriptions  - Higher Overdose Risk Scores correlate with increased risk of unintentional overdose death   Concerning or unexpectedly high scores should prompt a review of the PMP record; this does not constitute checking PMP for prescribing purposes.    E.D. Visit Count (12 mo.)  Facility Visits   [facility]   [facility] [place] [facility]   [facility] 4   Total 7   Note: Visits indicate total known visits.     Recent Emergency Department Visit Summary  [facility] or Chief Complaint    [facility], 2021  [facility]  [name]  Emergency      flu like symptoms      Dec [facility], 2021  [facility]  [place]  Emergency     Dec 08, 2021  [place] [facility] [place] [facility]  [name]  Emergency      Chest Pain - SOB      Chest Pain      Dizziness      Shortness of Breath      Syncope and collapse      Chest pain, unspecified      Dec 02, 2021  [place] [facility] [place] [facility]  [name]  Emergency       Chest Pain      Syncope      Weakness      Chest pain, unspecified      Jul 06, 2021  [place] [facility] [place] [facility]  [name]  Emergency      prev stroke last week-blood leakage/blurry vision      Blurred Vision      Other visual disturbances      Jul 01, 2021  [facility]  [place]  Emergency     Mar 11, 2021  [facility] [place]  [place]  [place]  Emergency      numbness      HAND NUMBNESS        Recent Inpatient Visit Summary  [facility] Diagnoses or Chief Complaint    [facility]  [place]  Inpatient     Mar 11, 2021  [facility] [place]  [place]  [place]  General Medicine      Weakness      Foot drop, left foot  Care Team  Provider Specialty Phone Fax Service Dates   Apolinar Junes, MD Family Medicine   Current      Collective Portal  This patient has registered at the [facility] Emergency Department   For more information visit: [email or web address]     PLEASE NOTE:     1.   Any care recommendations and other clinical information are provided as guidelines or for historical purposes only, and providers should exercise their own clinical judgment when providing care.    2.   You may only use this information for purposes of treatment, payment or health care operations activities, and subject to the limitations of applicable Collective Policies.    3.   You should consult directly with the organization that provided a care guideline or other clinical history with any questions about additional information or accuracy or completeness of information provided.    ? 2023 [place], [employer] - [email or web address]

## 2021-12-21 NOTE — ED Notes (Signed)
Notified Dr. Melvyn Neth of 101.5 temp

## 2021-12-21 NOTE — ED Provider Notes (Signed)
EMERGENCY DEPARTMENT HISTORY AND PHYSICAL EXAM    Date: 12/21/2021  Patient Name: Kurt Rogers  Attending Physician:  Kelly Splinter, MD    History of Presenting Illness     Chief Complaint   Patient presents with    Chest Pain    Flu like symptoms     History Provided By: patient  Chief Complaint: flu like sxs and chest pain    Additional History: Kurt Rogers is a 31 y.o. male. Has a history of congenital MV stenosis, ADHD, mild asthma and also h/o neuro issues for which he sees Neuro: Dr. Sanda Klein (strong family history of MS and concern he might have the same but diagnosis not yet made). Ongoing issues with chest pain and today he states has the pain but milder than some prior episodes (pain at rest typically) and noted nausea and vomiting and copious diarrhea overnight. Very watery. Already tried 4 imodium but still with diarrhea. No blood in emesis nor diarrhea. Chills and elevated temp prior to arrival. No real abd pain. No sob. But does have cough and congestion. Normal urination and no blood in this.    PCP: Gracelyn Nurse, MD  Dr. Wardell Heath is his cardiologist in [place]    No current facility-administered medications for this encounter.    Current Outpatient Medications:     amphetamine-dextroamphetamine (Adderall XR) 20 MG 24 hr capsule, Adderall XR 20 mg capsule,extended release  TAKE 1 CAPSULE BY MOUTH EVERY DAY IN THE MORNING, Disp: , Rfl:     ondansetron (ZOFRAN-ODT) 4 MG disintegrating tablet, Take 1 tablet (4 mg) by mouth every 6 (six) hours as needed for Nausea, Disp: 8 tablet, Rfl: 0    Past Medical History     Past Medical History:   Diagnosis Date    Neurological abnormality     Numbness     TIA (transient ischemic attack)      Past Surgical History:   Procedure Laterality Date    TONSILLECTOMY         Family History     History reviewed. No pertinent family history.    Social History     Social History     Socioeconomic History    Marital status: Single     Spouse name: Not on file     Number of children: Not on file    Years of education: Not on file    Highest education level: Not on file   Occupational History    Not on file   Tobacco Use    Smoking status: Never    Smokeless tobacco: Never   Substance and Sexual Activity    Alcohol use: Yes     Comment: socially    Drug use: No     Comment: marijuana 1-2x a month (edibles)    Sexual activity: Not on file   Other Topics Concern    Not on file   Social History Narrative    Not on file     Social Determinants of Health     Financial Resource Strain: Not on file   Food Insecurity: Not on file   Transportation Needs: Not on file   Physical Activity: Not on file   Stress: Not on file   Social Connections: Not on file   Intimate Partner Violence: Not on file   Housing Stability: Not on file       Allergies     Allergies   Allergen Reactions    Latex  Physical Exam     BP 102/67   Pulse 79   Temp 99.7 F (37.6 C) (Oral)   Resp 16   Ht 5\' 10"  (1.778 m)   Wt 101.2 kg   SpO2 97%   BMI 32.00 kg/m   Pulse Oximetry Analysis - Normal 100% On RA    Physical Exam   Constitutional: He is oriented to person, place, and time. He appears well-developed and well-nourished.   Head: Normocephalic and atraumatic.   Eyes: No scleral icterus.   Neck: Normal range of motion. Neck supple.   Cardiovascular: Intact distal pulses.    Pulmonary/Chest: Effort normal. No respiratory distress.   Neurological: He is alert and oriented to person, place, and time.   Skin: Skin is warm and dry.   Psychiatric: He has a normal mood and affect. His behavior is normal. Judgment and thought content normal.   Lungs ctab. Abd soft and mild diffuse ttp. No pedal edema. Clear and fluent speech.  Normal tone.    Diagnostic Study Results     Labs -     Results       Procedure Component Value Units Date/Time    Urinalysis Reflex to Microscopic Exam- Reflex to Culture [[id number]][[id number]]  (Abnormal) Collected: 12/21/21 1431    Specimen: Urine, Clean Catch Updated: 12/21/21 1558      Urine Type Urine, Clean Ca     Color, UA Yellow     Clarity, UA Turbid     Specific Gravity UA >=1.030     Urine pH 6.0     Leukocyte Esterase, UA Negative     Nitrite, UA Negative     Protein, UR 30     Glucose, UA Negative     Ketones UA Negative     Urobilinogen, UA 0.2 mg/dL      Bilirubin, UA Small     Blood, UA Negative     RBC, UA 0 - 2 /hpf      WBC, UA 0 - 5 /hpf      Urine Mucus Present    CBC and differential [[id number]][[id number]]  (Abnormal) Collected: 12/21/21 1119    Specimen: Blood Updated: 12/21/21 1326     WBC 11.35 x10 3/uL      Hgb 16.6 g/dL      Hematocrit 30.846.9 %      Platelets 314 x10 3/uL      RBC 5.97 x10 6/uL      MCV 78.6 fL      MCH 27.8 pg      MCHC 35.4 g/dL      RDW 12 %      MPV 9.8 fL      Instrument Absolute Neutrophil Count 9.84 x10 3/uL      Neutrophils 86.6 %      Lymphocytes Automated 7.7 %      Monocytes 5.2 %      Eosinophils Automated 0.0 %      Basophils Automated 0.2 %      Immature Granulocytes 0.3 %      Nucleated RBC 0.0 /100 WBC      Neutrophils Absolute 9.84 x10 3/uL      Lymphocytes Absolute Automated 0.87 x10 3/uL      Monocytes Absolute Automated 0.59 x10 3/uL      Eosinophils Absolute Automated 0.00 x10 3/uL      Basophils Absolute Automated 0.02 x10 3/uL      Immature Granulocytes Absolute 0.03 x10 3/uL  Absolute NRBC 0.00 x10 3/uL     High Sensitivity Troponin-I [[id number]] Collected: 12/21/21 1119    Specimen: Blood Updated: 12/21/21 1206     hs Troponin-I <2.7 ng/L     Comprehensive metabolic panel [[id number]] Collected: 12/21/21 1119    Specimen: Blood Updated: 12/21/21 1158     Glucose 100 mg/dL      BUN 38.1 mg/dL      Creatinine 0.9 mg/dL      Sodium 829 mEq/L      Potassium 4.2 mEq/L      Chloride 106 mEq/L      CO2 25 mEq/L      Calcium 9.4 mg/dL      Protein, Total 7.1 g/dL      Albumin 4.4 g/dL      AST (SGOT) 18 U/L      ALT 28 U/L      Alkaline Phosphatase 69 U/L      Bilirubin, Total 0.8 mg/dL      Globulin 2.7 g/dL      Albumin/Globulin Ratio 1.6     Anion  Gap 11.0    GFR [[id number]] Collected: 12/21/21 1119     Updated: 12/21/21 1158     EGFR >60.0       COVID-19 (SARS-CoV-2) and Influenza A/B, NAA ([facility]) [[id number]] Collected: 12/21/21 0951    Specimen: Culturette from Nasopharyngeal Updated: 12/21/21 1137     Purpose of COVID testing Diagnostic -[facility]     SARS-CoV-2 Specimen Source Nasal Swab     SARS CoV 2 Overall Result Not Detected     Influenza A Not Detected     Influenza B Not Detected    Narrative:      o Collect and clearly label specimen type:  o PREFERRED-Upper respiratory specimen: One Nasal Swab in  Transport Media.  o Hand deliver to laboratory ASAP  Diagnostic -[facility]            Radiologic Studies -   Radiology Results (24 Hour)       Procedure Component Value Units Date/Time    Chest AP Portable [[id number]] Collected: 12/21/21 1025    Order Status: Completed Updated: 12/21/21 1028    Narrative:      HISTORY: Chest pain    EXAMINATION: AP upright radiographic view of chest presented for  evaluation.    COMPARISON: 12/08/2021    FINDINGS: No acute edema. No focal consolidation or effusions. Cardiac  silhouette not enlarged. Pleural surfaces and osseous structures are  intact.      Impression:       No radiographic evidence of acute cardiopulmonary process.    Ivin Poot, MD   12/21/2021 10:26 AM        .    Doctor's Notes     Throughout the stay in the Emergency Department, questions and concerns surrounding pain control, care plans, diagnostic studies, effects of medications administered or prescribed, and future prognostic dilemmas were assessed and addressed.    VITALS:  Patient Vitals for the past 12 hrs:   BP Temp Pulse Resp   12/21/21 1613 102/67 99.7 F (37.6 C) 79 16   12/21/21 1430 93/58 99.7 F (37.6 C) 77 --   12/21/21 1204 97/57 (!) 101.5 F (38.6 C) 86 --   12/21/21 0953 116/74 98.2 F (36.8 C) 88 12       ----------MEDICAL DECISION MAKING----------    I personally reviewed all labs and imaging in this ED provider note as part of my  medical  decision making. Any personally reviewed imaging or lab/ekg/monitor/vitals interpretation will be noted below.     EKG: sinus with sinus arrhythmia, rate 76. Fair r wave progression with large downward forces anteriorly. No pvcs. No stemi/st depressions/pathologic twi and normal axis/intervals - overall similar to prior on 12/08/21    OUR OLD RECORDS: seen for sob/chest pain 1/23 and then seen for left sided weakness and admitted 1/17    OUTSIDE RECORDS: went to urgent care complaining of diarrhea/vomiting without cough/sore throat - given zofran odt; seen 12/19/21 at [facility] for chest pain- reassuring h/h, wbc, plt count, neg trop, reassuring renal fxn, K reassuring and mildly high bsl of 131    INITIAL IMP & PLAN: flu/covid like illness. Plan on screening for same and supportive care with nausea meds and ivf and also screen urine and cardiac labs given chest pain but has had several low risk chest pain rule outs and his gi upset is more acute today.    ED COURSE:     Patient had several watery stools here and last with some blood so we talked that he can't take imodium with blood in stool and this could be transitory and maybe irritation related. Further, he is tolerating po well. Finally, he had minimal initial urine output and it was cloudy so we held him here for more urine for proper ua. Ua without infection but lab took far longer than typical to process this lab and we had to call them. Apologized to patient. Finally I explained to the patient that I didn't think he needed abx (only trace blood x1, no travel, <24 hours illness) and that it could make his sxs worse at this point.    FINAL IMPRESSION (in addition to below): subacute intermittent atypical chest pain and acute vomiting/febrile illness with copious diarrhea  _______________________________    4:32 PM - Rx use and side effects, results, home self care, discharge instructions, and return precautions discussed extensively with patient. Possibility  of evolving illness reviewed. All questions solicited and addressed. Patient is amenable to discharge.     _______________________________  Medical DeMedical Decision Makingcision Making  Attestations:    Kelly Splinter, MD is the primary emergency doctor of record.    _______________________________    Diagnosis and Disposition     Final Impression:   1. Chest pain in adult    2. Vomiting and diarrhea        Disposition:   ED Disposition       ED Disposition   Discharge    Condition   --    Date/Time   Sun Dec 21, 2021  4:01 PM    Comment   Kurt Rogers discharge to home/self care.    Condition at disposition: Stable                        Joya San, MD  12/22/21 [phone]

## 2021-12-21 NOTE — ED Triage Notes (Addendum)
Pt sent from urgent care for further evaluation for c/o chest pain 6/10 that started X1 hr ago and is constant, c/o flu like symptom, cough, congestion, F/C/ N/V, & diarrhea that started at approx. midnight last night. Pt not vaccinated for flu.  Denies chest pain or sob. Speaks in full sentences on room air with good perfusion. Steady gait on ambulation. HX mitral valve stenosis, ADHD, asthma. Allergic to latex and shellfish.

## 2021-12-22 LAB — ECG 12-LEAD
Atrial Rate: 76 {beats}/min
IHS MUSE NARRATIVE AND IMPRESSION: NORMAL
P Axis: 41 degrees
P-R Interval: 166 ms
Q-T Interval: 354 ms
QRS Duration: 90 ms
QTC Calculation (Bezet): 398 ms
R Axis: 66 degrees
T Axis: 6 degrees
Ventricular Rate: 76 {beats}/min

## 2022-01-13 ENCOUNTER — Ambulatory Visit (INDEPENDENT_AMBULATORY_CARE_PROVIDER_SITE_OTHER): Payer: No Typology Code available for payment source | Admitting: Cardiovascular Disease

## 2022-01-13 ENCOUNTER — Encounter (INDEPENDENT_AMBULATORY_CARE_PROVIDER_SITE_OTHER): Payer: Self-pay | Admitting: Cardiovascular Disease

## 2022-01-13 ENCOUNTER — Ambulatory Visit (INDEPENDENT_AMBULATORY_CARE_PROVIDER_SITE_OTHER): Payer: No Typology Code available for payment source

## 2022-01-13 VITALS — BP 110/74 | HR 60 | Resp 18 | Wt 220.0 lb

## 2022-01-13 DIAGNOSIS — E785 Hyperlipidemia, unspecified: Secondary | ICD-10-CM

## 2022-01-13 DIAGNOSIS — I059 Rheumatic mitral valve disease, unspecified: Secondary | ICD-10-CM

## 2022-01-13 DIAGNOSIS — R079 Chest pain, unspecified: Secondary | ICD-10-CM

## 2022-01-13 DIAGNOSIS — E6609 Other obesity due to excess calories: Secondary | ICD-10-CM

## 2022-01-13 DIAGNOSIS — F909 Attention-deficit hyperactivity disorder, unspecified type: Secondary | ICD-10-CM

## 2022-01-13 DIAGNOSIS — R002 Palpitations: Secondary | ICD-10-CM

## 2022-01-13 DIAGNOSIS — R0609 Other forms of dyspnea: Secondary | ICD-10-CM

## 2022-01-13 NOTE — Progress Notes (Signed)
[facility] CONSULTATION    I had the pleasure of seeing Mr. Santagata today for cardiovascular evaluation.    He is a pleasant 31 y.o. male who presents for evaluation of chest pain.    The patient was seen in the emergency department with chest discomfort several times in January and February of this year. The first time he also endorsed LOC and simply woke up on the floor while he was at work.  He does not know how long he was out for.. Work-up in the emergency department has been largely unremarkable including a negative D dimer, BNP and troponin.  The patient had several ECGs which revealed poor R wave progression which was read as possible anterior MI.  Reportedly he was noted to have some Pvcs. Patient has a family history of multiple sclerosis and he has been following with a neurologist for work-up.  Reportedly nothing has returned concerning.    The patient has had on/off chest pain for many years. It has been particularly bad over the past month. It will occasionally wake him up from sleep and he will feel his heart racing during these episodes. He will try to relax and his heart rate will improve which will also improve his pain. Typically takes about 20 minutes to resolve. He will occasionally get the chest pain during the day and it does not appear to be exertional. He endorses heart racing when trying to walk up hills.  Other than the episode of syncope described above he has not had other episodes where he has lost consciousness.  He has felt dizzy at times.  Over the past 2 weeks he has not had any significant chest discomfort.    Paternal grandfather had CABG. Father has heart disease but has not needed any procedures.    The patient had an admission at [facility] in February 2022 for left-sided weakness and vision changes.  Given his family history of multiple sclerosis there was concern that he could have the same.  However MRI did not reveal any  acute changes.  Additionally on previous hospital stay in [place] the patient had a lumbar puncture that was reportedly unremarkable.  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      PAST MEDICAL HISTORY: He has a past medical history of Neurological abnormality, Numbness, and TIA (transient ischemic attack). He has a past surgical history that includes Tonsillectomy.    MEDICATIONS:   Current Outpatient Medications:     amphetamine-dextroamphetamine (Adderall XR) 20 MG 24 hr capsule, Adderall XR 20 mg capsule,extended release  TAKE 1 CAPSULE BY MOUTH EVERY DAY IN THE MORNING, Disp: , Rfl:     aspirin EC 81 MG EC tablet, Take 81 mg by mouth daily, Disp: , Rfl:     ondansetron (ZOFRAN-ODT) 4 MG disintegrating tablet, Take 1 tablet (4 mg) by mouth every 6 (six) hours as needed for Nausea, Disp: 8 tablet, Rfl: 0     ALLERGIES:   Allergies   Allergen Reactions    Latex        FAMILY HISTORY: No family history on file.     SOCIAL HISTORY: He reports that he has never smoked. He has never used smokeless tobacco. He reports current alcohol use. He reports that he does not use drugs. He enjoys outdoor  activities. He enjoys reading.    REVIEW OF SYSTEMS: All other systems reviewed and negative except as above.     PHYSICAL EXAMINATION  Health Related Quality of Life:    General Appearance:  A well-appearing male in no acute distress.    Vital Signs: BP 110/74   Pulse 60   Resp 18   Wt 99.8 kg (220 lb)   BMI 31.57 kg/m    HEENT: Sclera anicteric, conjunctiva without pallor, moist mucous membranes, normal dentition. No arcus.   Neck:  Supple without jugular venous distention. Thyroid nonpalpable. Normal carotid upstrokes without bruits.   Chest: Clear to auscultation bilaterally with good air movement  and respiratory effort and no wheezes, rales, or rhonchi   Cardiovascular: Normal S1 and physiologically split S2 without murmurs, gallops or rub. PMI of normal size and nondisplaced.   Abdomen: Soft, nontender, nondistended, with normoactive bowel sounds. No organomegaly.  No pulsatile masses, or bruits.   Extremities: Warm without edema, clubbing, or cyanosis. All peripheral pulses are full and equal.   Skin: No rash, xanthoma or xanthelasma.   Neuro: Alert and oriented x3. Grossly intact. Strength is symmetrical. Normal mood and affect.    LABS:   Lab Results   Component Value Date    WBC 11.35 (H) 12/21/2021    HGB 16.6 12/21/2021    HCT 46.9 12/21/2021    PLT 314 12/21/2021    NA 142 12/21/2021    K 4.2 12/21/2021    BUN 17.0 12/21/2021    CREAT 0.9 12/21/2021    GLU 100 12/21/2021    CHOL 163 12/03/2021    TRIG 125 12/03/2021    HDL 30 (L) 12/03/2021    LDL 108 (H) 12/03/2021    AST 18 12/21/2021    ALT 28 12/21/2021    HGBA1C 5.1 12/03/2021      12/2020 [facility])  Interpretation Summary  The left ventricular systolic function is normal.  Redundant mitral valve without any significant reflux/regurgitation  There is no evidence of mitral valve prolapse.  No thrombus is detected in the left atrial appendage.    Left Ventricle:  The left ventricle is grossly normal size. The left ventricular systolic  function is normal.    Right Ventricle:  The right ventricle is grossly normal size.    Left Atrium/Atrial Septum:  No thrombus is detected in the left atrial appendage. The left atrium is  normal size. Injection of agitated saline contrast documented no interatrial  or intrapulmonary shunt.    Aortic Valve:  Structurally normal aortic valve.    Mitral Valve:  Redundant mitral valve without any significant reflux/regurgitation. There is  no evidence of mitral valve prolapse. There is no vegetation seen on the  mitral valve.    Tricuspid Valve:  There is trace tricuspid regurgitation.    Arteries:  Normal  aortic arch.    IMPRESSION/RECOMMENDATIONS:    Mr. Mah is a 31 y.o. 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  He is also describing episodes of tachycardia waking him from sleep.  I recommend a 30-day monitor to assess for  malignant arrhythmias (given his reported history of redundant mitral valve).  I also recommend an echocardiogram.  And lastly, I recommend an ECG stress test to assess his heart response, exercise capacity and for any ischemic changes.   -TTE  -30 day monitor  -ECG stress test    He will return in 3 months    Elmon Else. Genice Kimberlin, MD  [facility]

## 2022-01-13 NOTE — Progress Notes (Signed)
[facility]     Placed 30 days MCOT monitor on patient.  Pt given instructions regarding length of prescription, how to use device and monitor, and who to call for any problems/questions, how to return supplies.  Pt verbalized understanding of all of the above.

## 2022-01-13 NOTE — Patient Instructions (Signed)
Echo    Monitor    Stress Test

## 2022-01-21 ENCOUNTER — Ambulatory Visit (INDEPENDENT_AMBULATORY_CARE_PROVIDER_SITE_OTHER): Payer: No Typology Code available for payment source

## 2022-01-21 ENCOUNTER — Encounter (INDEPENDENT_AMBULATORY_CARE_PROVIDER_SITE_OTHER): Payer: Self-pay

## 2022-01-21 DIAGNOSIS — I059 Rheumatic mitral valve disease, unspecified: Secondary | ICD-10-CM

## 2022-01-21 DIAGNOSIS — R002 Palpitations: Secondary | ICD-10-CM

## 2022-01-29 ENCOUNTER — Ambulatory Visit: Payer: No Typology Code available for payment source

## 2022-03-16 ENCOUNTER — Ambulatory Visit (INDEPENDENT_AMBULATORY_CARE_PROVIDER_SITE_OTHER): Payer: No Typology Code available for payment source | Admitting: Physician Assistant

## 2022-03-16 ENCOUNTER — Encounter (INDEPENDENT_AMBULATORY_CARE_PROVIDER_SITE_OTHER): Payer: Self-pay

## 2022-03-16 VITALS — BP 130/81 | HR 87 | Temp 98.3°F | Resp 16 | Ht 71.0 in | Wt 206.0 lb

## 2022-03-16 DIAGNOSIS — R21 Rash and other nonspecific skin eruption: Secondary | ICD-10-CM

## 2022-03-16 MED ORDER — PREDNISONE 20 MG PO TABS
40.0000 mg | ORAL_TABLET | Freq: Every day | ORAL | 0 refills | Status: AC
Start: 2022-03-16 — End: 2022-03-21

## 2022-03-16 NOTE — Patient Instructions (Signed)
Avoid deodorant when possible.   After showering apply the aquaphor as you have been.   Mixing in benadryl cream may help reduce the itching.   Take the Prednisone daily.     Follow up with dermatology.  Take pictures of your rash, and any changes.

## 2022-03-16 NOTE — Progress Notes (Signed)
[facility]  URGENT  CARE  PROGRESS NOTE     Patient: Kurt Rogers   Date: 03/16/2022   MRN: [id number]       Kurt Rogers is a 31 y.o. male      HISTORY     History obtained from: Patient    Chief Complaint   Patient presents with    Rash     Rash along pt's armpits for 2 months, seems to get better and then flares back up. Itches, and whenever he scratches it feels like it burns. Pt tried many types of deodorants, hydrocortizone, anti itch creams with little to no change.          Rash     patient with 2 months of irritation bilateral armpits comes in for assistance at this time.  Initially suspected was deodorant to be switched multiple times, different brands, different scents, with aluminum without aluminum, has tried hydrocortisone which self slightly, but the most beneficial agent has been Aquaphor.  Past medical history for psoriasis.    Review of Systems   Skin:  Positive for rash.    ROS negative unless noted in hpi     History:    Pertinent Past Medical, Surgical, Family and Social History were reviewed.        Current Outpatient Medications:     amphetamine-dextroamphetamine (ADDERALL XR) 20 MG 24 hr capsule, Take 10 mg by mouth 2 (two) times daily, Disp: , Rfl:     FLUoxetine (PROzac) 20 MG capsule, Take 20 mg by mouth daily, Disp: , Rfl:     aspirin EC 81 MG EC tablet, Take 81 mg by mouth daily, Disp: , Rfl:     ondansetron (ZOFRAN-ODT) 4 MG disintegrating tablet, Take 1 tablet (4 mg) by mouth every 6 (six) hours as needed for Nausea (Patient not taking: Reported on 01/21/2022), Disp: 8 tablet, Rfl: 0    predniSONE (DELTASONE) 20 MG tablet, Take 2 tablets (40 mg) by mouth daily for 5 days, Disp: 10 tablet, Rfl: 0    Allergies   Allergen Reactions    Latex     Shellfish-Derived Products Angioedema       Medications and Allergies reviewed.    PHYSICAL EXAM     Vitals:    03/16/22 1846   BP: 130/81   Pulse: 87   Resp: 16   Temp: 98.3 F (36.8 C)   TempSrc: Tympanic   SpO2: 97%   Weight: 93.4 kg  (206 lb)   Height: 1.803 m (5\' 11" )       Physical Exam  Skin:     Comments: Bilateral axilla: Erythematous swelling mild tenderness.  Localized to the axilla.  There is no induration or fluctuance, no pustules, vesicles, bulla or blisters      Vitals and nursing note reviewed.   Constitutional:       General: Not in acute distress.     Appearance: Normal appearance. Not ill-appearing or toxic-appearing.   HENT:      Head: Normocephalic and atraumatic.   Eyes:      Conjunctiva/sclera: Conjunctivae normal.   Neck:      Musculoskeletal: Normal range of motion.   Respiratory:      Normal effort. Able to speak in full sentences.  Neurological:      Mental Status: Alert and oriented.  Psychiatric:         Mood and Affect: Mood normal.         Behavior: Behavior normal.  [facility] COURSE     There were no labs reviewed with this patient during the visit.    There were no x-rays reviewed with this patient during the visit.    No current facility-administered medications for this visit.       PROCEDURES     Procedures    MEDICAL DECISION MAKING     History, physical, labs/studies most consistent with skin irritation as the diagnosis.    Chart Review:  Prior PCP, Specialist and/or ED notes reviewed today: No  Prior labs/images/studies reviewed today: No    Differential Diagnosis: Differential Diagnosis: Eczema, Atopic Dermatitis, Contact Dermatitis, Viral Exanthem, Fungal Infection, Cellulitis, Insect Bite, Allergic Drug Reaction, Stasis Dermatitis, Shingles, Erythema Migrans, Pityriasis Rosea.  Medical history significant for psoriasis presents with bilateral axillary rashes.  This is began after using deodorant with a burning and stinging sensation.  He has had some alleviation with hydrocortisone, but mostly with Aquaphor.  Suspicion for infectious cause is low.  Patient was advised to apply the Aquaphor after showering, avoid deodorant, and prescribed a prednisone burst.  Patient provided dermatology referral.   Discharged home with normal vital signs.      ASSESSMENT     Encounter Diagnosis   Name Primary?    Rash and other nonspecific skin eruption Yes            PLAN      PLAN: see MDM             Orders Placed This Encounter   Procedures    Ambulatory referral to Dermatology     Requested Prescriptions     Signed Prescriptions Disp Refills    predniSONE (DELTASONE) 20 MG tablet 10 tablet 0     Sig: Take 2 tablets (40 mg) by mouth daily for 5 days       Discussed results and diagnosis with patient/family.  Reviewed warning signs for worsening condition, as well as, indications for follow-up with primary care physician and return to urgent care clinic.   Patient/family expressed understanding of instructions.     An After Visit Summary was provided to the patient.

## 2022-03-22 ENCOUNTER — Ambulatory Visit (INDEPENDENT_AMBULATORY_CARE_PROVIDER_SITE_OTHER): Payer: No Typology Code available for payment source | Admitting: Physician Assistant

## 2022-03-22 ENCOUNTER — Encounter (INDEPENDENT_AMBULATORY_CARE_PROVIDER_SITE_OTHER): Payer: Self-pay

## 2022-03-22 VITALS — BP 128/76 | HR 76 | Temp 97.8°F | Resp 16 | Ht 71.0 in | Wt 216.0 lb

## 2022-03-22 DIAGNOSIS — H6983 Other specified disorders of Eustachian tube, bilateral: Secondary | ICD-10-CM

## 2022-03-22 NOTE — Progress Notes (Signed)
[facility]  URGENT  CARE  PROGRESS NOTE     Patient: Kurt Rogers   Date: 03/22/2022   MRN: [id number]       [name] is a 31 y.o. male      HISTORY     History obtained from: Patient    Chief Complaint   Patient presents with    Ear Fullness     Bilateral ear clogged x 1 days.           Ear Fullness      patient presents with concern regarding bilateral ear pressure for the last day.  Feels as though it is very clogged.  He noted some liquid coming out of clinic this years, which is what led him to get an appointment to be seen today.  Denies any other symptoms    Review of Systems ROS negative unless noted in HPI      History:    Pertinent Past Medical, Surgical, Family and Social History were reviewed.        Current Outpatient Medications:     amphetamine-dextroamphetamine (ADDERALL XR) 20 MG 24 hr capsule, Take 10 mg by mouth 2 (two) times daily, Disp: , Rfl:     FLUoxetine (PROzac) 20 MG capsule, Take 20 mg by mouth daily, Disp: , Rfl:     aspirin EC 81 MG EC tablet, Take 81 mg by mouth daily (Patient not taking: Reported on 03/22/2022), Disp: , Rfl:     ondansetron (ZOFRAN-ODT) 4 MG disintegrating tablet, Take 1 tablet (4 mg) by mouth every 6 (six) hours as needed for Nausea (Patient not taking: Reported on 01/21/2022), Disp: 8 tablet, Rfl: 0    Allergies   Allergen Reactions    Latex     Shellfish-Derived Products Angioedema       Medications and Allergies reviewed.    PHYSICAL EXAM     Vitals:    03/22/22 1526   BP: 128/76   Pulse: 76   Resp: 16   Temp: 97.8 F (36.6 C)   TempSrc: Temporal   SpO2: 98%   Weight: 98 kg (216 lb)   Height: 1.803 m (5\' 11" )       Physical Exam  HENT:      Right Ear: Ear canal and external ear normal.      Left Ear: Ear canal and external ear normal.      Ears:      Comments: Bilateral TM effusions.      Vitals and nursing note reviewed.   Constitutional:       General: Not in acute distress.     Appearance: Normal appearance. Not ill-appearing or toxic-appearing.    HENT:      Head: Normocephalic and atraumatic.   Eyes:      Conjunctiva/sclera: Conjunctivae normal.   Neck:      Musculoskeletal: Normal range of motion.   Respiratory:      Normal effort. Able to speak in full sentences.  Neurological:      Mental Status: Alert and oriented.  Psychiatric:         Mood and Affect: Mood normal.         Behavior: Behavior normal.        UCC COURSE     There were no labs reviewed with this patient during the visit.    There were no x-rays reviewed with this patient during the visit.    No current facility-administered medications for this visit.  PROCEDURES     Procedures    MEDICAL DECISION MAKING     History, physical, labs/studies most consistent with eustachian tube dysfunction as the diagnosis.    Chart Review:  Prior PCP, Specialist and/or ED notes reviewed today: No  Prior labs/images/studies reviewed today: No    Differential Diagnosis: om, oe, etd  No signs of erythema or discharge, due to effusions, high suspicion for eustachian tube dysfunction. Pt advised on supportive measures.       ASSESSMENT     Encounter Diagnosis   Name Primary?    Eustachian tube dysfunction, bilateral Yes            PLAN      PLAN: see MDM             No orders of the defined types were placed in this encounter.    Requested Prescriptions      No prescriptions requested or ordered in this encounter       Discussed results and diagnosis with patient/family.  Reviewed warning signs for worsening condition, as well as, indications for follow-up with primary care physician and return to urgent care clinic.   Patient/family expressed understanding of instructions.     An After Visit Summary was provided to the patient.

## 2022-03-22 NOTE — Patient Instructions (Signed)
Flonase twice a day.  Benadryl at night (this will make you drowsy, do not drive, operate heavy machinery, or consume alcohol while taking.   Sudafed (ask the pharmacist, it is not prescription, but they track purchases).

## 2022-03-26 ENCOUNTER — Encounter (INDEPENDENT_AMBULATORY_CARE_PROVIDER_SITE_OTHER): Payer: Self-pay

## 2022-03-26 ENCOUNTER — Ambulatory Visit (INDEPENDENT_AMBULATORY_CARE_PROVIDER_SITE_OTHER): Payer: No Typology Code available for payment source | Admitting: Physician Assistant

## 2022-03-26 VITALS — BP 123/82 | Temp 98.2°F | Resp 16 | Ht 70.0 in | Wt 212.0 lb

## 2022-03-26 DIAGNOSIS — R21 Rash and other nonspecific skin eruption: Secondary | ICD-10-CM

## 2022-03-26 MED ORDER — DOXYCYCLINE HYCLATE 100 MG PO TABS
100.0000 mg | ORAL_TABLET | Freq: Two times a day (BID) | ORAL | 0 refills | Status: AC
Start: 2022-03-26 — End: 2022-04-02

## 2022-03-26 MED ORDER — PREDNISONE 20 MG PO TABS
ORAL_TABLET | ORAL | 0 refills | Status: AC
Start: 2022-03-26 — End: 2022-04-10

## 2022-03-26 NOTE — Progress Notes (Signed)
[facility]  URGENT  CARE  PROGRESS NOTE     Patient: Kurt Rogers   Date: 03/26/2022   MRN: [id number]       Tag Wurtz is a 31 y.o. male      HISTORY     History obtained from: Patient    Chief Complaint   Patient presents with    Rash     Was seen on 5/07 for an axillary rash. Prednisone ran out and can't get in with a dermatologist yet.           Rash      Pt presents after being treated with prednisone for a rash is bilateral axillary areas. He notes the prednisone and Aquaphor helped a lot, but as the course of prednisone has completed the symptoms have returned. He also notes concern for new onset of bumps in the region that hurt as well. He has reached out to derm, but is on  a waitlist.     Review of Systems   Skin:  Positive for rash.    ROS negative unless noted in hpi     History:    Pertinent Past Medical, Surgical, Family and Social History were reviewed.        Current Outpatient Medications:     amphetamine-dextroamphetamine (ADDERALL XR) 20 MG 24 hr capsule, Take 10 mg by mouth 2 (two) times daily, Disp: , Rfl:     FLUoxetine (PROzac) 20 MG capsule, Take 20 mg by mouth daily, Disp: , Rfl:     ondansetron (ZOFRAN-ODT) 4 MG disintegrating tablet, Take 1 tablet (4 mg) by mouth every 6 (six) hours as needed for Nausea, Disp: 8 tablet, Rfl: 0    aspirin EC 81 MG EC tablet, Take 81 mg by mouth daily (Patient not taking: Reported on 03/22/2022), Disp: , Rfl:     doxycycline (VIBRA-TABS) 100 MG tablet, Take 1 tablet (100 mg) by mouth 2 (two) times daily for 7 days, Disp: 14 tablet, Rfl: 0    predniSONE (DELTASONE) 20 MG tablet, Take 3 tabs/day x 3 days;2 tabs /day x 3 days,1.5 tab /day x 3 days,1 tab/day x 3 days, 0.5 tab/day x 3 days., Disp: 24 tablet, Rfl: 0    Allergies   Allergen Reactions    Latex     Shellfish-Derived Products Angioedema       Medications and Allergies reviewed.    PHYSICAL EXAM     Vitals:    03/26/22 0829   BP: 123/82   Resp: 16   Temp: 98.2 F (36.8 C)   TempSrc:  Temporal   SpO2: 99%   Weight: 96.2 kg (212 lb)   Height: 1.778 m (5\' 10" )       Physical Exam  Skin:     Findings: Erythema present.      Comments: Bilateral axillary erythematous rashes with mild edema. Scattered pustular lesions in right axilla. Mild induration.      Vitals and nursing note reviewed.   Constitutional:       General: Not in acute distress.     Appearance: Normal appearance. Not ill-appearing or toxic-appearing.   HENT:      Head: Normocephalic and atraumatic.   Eyes:      Conjunctiva/sclera: Conjunctivae normal.   Neck:      Musculoskeletal: Normal range of motion.   Respiratory:      Normal effort. Able to speak in full sentences.  Neurological:      Mental Status: Alert and oriented.  Psychiatric:         Mood and Affect: Mood normal.         Behavior: Behavior normal.        UCC COURSE     There were no labs reviewed with this patient during the visit.    There were no x-rays reviewed with this patient during the visit.    No current facility-administered medications for this visit.       PROCEDURES     Procedures    MEDICAL DECISION MAKING     History, physical, labs/studies most consistent with skin infection with contact dermatitis.  as the diagnosis.    Chart Review:  Prior PCP, Specialist and/or ED notes reviewed today: No  Prior labs/images/studies reviewed today: No    Differential Diagnosis: Differential Diagnosis: Eczema, Atopic Dermatitis, Contact Dermatitis, Viral Exanthem, Fungal Infection, Cellulitis, Insect Bite, Allergic Drug Reaction, Stasis Dermatitis, Shingles, Erythema Migrans, Pityriasis Rosea.    Prednisone burst of 5 days helped symptoms tremendously, but they have returned as the course has been completed. Due to this. Treating with prednisone taper and abx for the pustular lesions. Pt provided additional referal to derm.  Discharged with vss.       ASSESSMENT     Encounter Diagnosis   Name Primary?    Rash and other nonspecific skin eruption Yes            PLAN       PLAN: see MDM             Orders Placed This Encounter   Procedures    Ambulatory referral to Dermatology     Requested Prescriptions     Signed Prescriptions Disp Refills    doxycycline (VIBRA-TABS) 100 MG tablet 14 tablet 0     Sig: Take 1 tablet (100 mg) by mouth 2 (two) times daily for 7 days    predniSONE (DELTASONE) 20 MG tablet 24 tablet 0     Sig: Take 3 tabs/day x 3 days;2 tabs /day x 3 days,1.5 tab /day x 3 days,1 tab/day x 3 days, 0.5 tab/day x 3 days.       Discussed results and diagnosis with patient/family.  Reviewed warning signs for worsening condition, as well as, indications for follow-up with primary care physician and return to urgent care clinic.   Patient/family expressed understanding of instructions.     An After Visit Summary was provided to the patient.

## 2022-03-26 NOTE — Patient Instructions (Signed)
Take the prednisone course as prescribed.     Take the doxycycline twice a day with food and full glass of water.  Do not lie down for roughly 1 hour after taking this medication as it can increase risk of heartburn.  You are more prone to sunburn for the next 17 days.  Do not drink any dairy products within 1 hour of taking as will weaken the medication.    Continue the aquaphor.     Use [facility] or [facility] for follow up.

## 2022-04-06 ENCOUNTER — Encounter (INDEPENDENT_AMBULATORY_CARE_PROVIDER_SITE_OTHER): Payer: Self-pay | Admitting: Family

## 2022-04-06 ENCOUNTER — Ambulatory Visit (INDEPENDENT_AMBULATORY_CARE_PROVIDER_SITE_OTHER): Payer: No Typology Code available for payment source | Admitting: Family

## 2022-04-06 VITALS — BP 126/83 | HR 91 | Temp 98.3°F | Resp 18 | Ht 70.0 in | Wt 218.0 lb

## 2022-04-06 DIAGNOSIS — Z4802 Encounter for removal of sutures: Secondary | ICD-10-CM

## 2022-04-06 NOTE — Patient Instructions (Signed)
You were seen in the clinic today for a suture removal to your left underarm.  You had 1 suture removed.  Continue watching area for signs and symptoms of infection.  Keep area clean until fully healed.    Please return or go to the ER for any new or worsening symptoms that concern you.  Follow-up with family doctor in 3-5 days.

## 2022-04-06 NOTE — Progress Notes (Signed)
[facility]  URGENT  CARE  PROGRESS NOTE     Patient: Kurt Rogers   Date: 04/06/2022   MRN: [id number]       Kurt Rogers is a 31 y.o. male      HISTORY     History obtained from: Patient    Chief Complaint   Patient presents with    patient is present with irritaion under left arm     Where stitches was place by his dermotolgist        HPI 31 year old male with no past medical hx presents to urgent care clinic with concerns for requesting suture removed out of his left underarm.  Had a biopsy done 2 weeks ago by his dermatologist and actually has suture scheduled to be taken out tomorrow but cannot stand irritation and itching from area.  Denies any overt pain, drainage, fever, numbness, tingling, extremity weakness.    Review of Systems as above    History:    Pertinent Past Medical, Surgical, Family and Social History were reviewed.        Current Outpatient Medications:     amphetamine-dextroamphetamine (ADDERALL XR) 20 MG 24 hr capsule, Take 10 mg by mouth 2 (two) times daily, Disp: , Rfl:     aspirin EC 81 MG EC tablet, Take 1 tablet (81 mg) by mouth daily, Disp: , Rfl:     FLUoxetine (PROzac) 20 MG capsule, Take 1 capsule (20 mg) by mouth daily, Disp: , Rfl:     ondansetron (ZOFRAN-ODT) 4 MG disintegrating tablet, Take 1 tablet (4 mg) by mouth every 6 (six) hours as needed for Nausea (Patient not taking: Reported on 04/06/2022), Disp: 8 tablet, Rfl: 0    predniSONE (DELTASONE) 20 MG tablet, Take 3 tabs/day x 3 days;2 tabs /day x 3 days,1.5 tab /day x 3 days,1 tab/day x 3 days, 0.5 tab/day x 3 days. (Patient not taking: Reported on 04/06/2022), Disp: 24 tablet, Rfl: 0    Allergies   Allergen Reactions    Latex     Shellfish-Derived Products Angioedema       Medications and Allergies reviewed.    PHYSICAL EXAM     Vitals:    04/06/22 1805   BP: 126/83   BP Site: Right arm   Patient Position: Sitting   Cuff Size: Large   Pulse: 91   Resp: 18   Temp: 98.3 F (36.8 C)   SpO2: 96%   Weight: 98.9 kg (218  lb)   Height: 1.778 m (5\' 10" )       Physical Exam  Constitutional:       General: He is not in acute distress.     Appearance: Normal appearance. He is well-developed.   HENT:      Head: Normocephalic and atraumatic.      Nose: Nose normal.      Mouth/Throat:      Mouth: Mucous membranes are moist.      Pharynx: No oropharyngeal exudate.     Eyes: Conjunctivae and EOM are normal. Pupils are equal, round, and reactive to light. No scleral icterus. Neck:      Thyroid: No thyroid mass or thyromegaly.      Vascular: No carotid bruit.   Cardiovascular:      Rate and Rhythm: Normal rate and regular rhythm.      Pulses: Normal pulses.      Heart sounds: Normal heart sounds. No murmur heard.  Pulmonary:      Effort: Pulmonary effort  is normal. No respiratory distress.      Breath sounds: Normal breath sounds. No wheezing.   Abdominal:      General: Bowel sounds are normal. There is no distension.      Palpations: Abdomen is soft. There is no mass.      Tenderness: There is no abdominal tenderness.   Musculoskeletal:         General: Normal range of motion.      Cervical back: Normal range of motion and neck supple.   Lymphadenopathy:      Cervical: No cervical adenopathy.   Neurological:      Mental Status: He is alert and oriented to person, place, and time.      Cranial Nerves: No cranial nerve deficit.      Sensory: No sensory deficit.      Gait: Gait normal.      Deep Tendon Reflexes: Reflexes are normal and symmetric.   Skin:     General: Skin is warm and dry.      Findings: No rash.      Comments: Suture x1 noted to left underarm.  Area of biopsy appears to be dry and well approximated.  No overt induration, discharge, tenderness, or lymphatic streaking noted to area.  No wound dehiscence noted.   Psychiatric:         Mood and Affect: Mood normal.         Speech: Speech normal.         Behavior: Behavior normal.   Vitals and nursing note reviewed.          UCC COURSE     There were no labs reviewed with this patient  during the visit.    There were no x-rays reviewed with this patient during the visit.    No current facility-administered medications for this visit.       PROCEDURES     Procedures    MEDICAL DECISION MAKING     History, physical, labs/studies most consistent with suture removal as the diagnosis.    Chart Review:  Prior PCP, Specialist and/or ED notes reviewed today: No  Prior labs/images/studies reviewed today: No    Differential Diagnosis: Suture removal, cellulitis, retained foreign body, wound dehiscence      ASSESSMENT     Encounter Diagnosis   Name Primary?    Visit for suture removal Yes            PLAN      PLAN: VSS, LUE NV intact, sutures x1 appears dry and well approximated to left underarm.  No wound dehiscence noted and concern for cellulitis is low.  Suture x1 removed and patient tolerated procedure well.    Patient was seen on March 11 for a rash to his left underarm.  Was seen by provider and started on doxycycline and prednisone but states that he actually went to see the dermatologist on March 12.  Was advised to stop prednisone and doxycycline and then had a skin biopsy done.  States he follows up tomorrow with his dermatologist for skin biopsy results. Advised to keep appointment with dermatology.  \  Strict ER precautions discussed.  Indicated understanding and agreed to treatment plan.              No orders of the defined types were placed in this encounter.    Requested Prescriptions      No prescriptions requested or ordered in this encounter       Discussed results  and diagnosis with patient/family.  Reviewed warning signs for worsening condition, as well as, indications for follow-up with primary care physician and return to urgent care clinic.   Patient/family expressed understanding of instructions.     An After Visit Summary was provided to the patient.

## 2022-04-17 IMAGING — CT CT Brain W-O Contrast AIS
3 of 4 series · 15 of 47 positions shown, 18 images · non-contrast
Comparison: None

CT Brain W-O Contrast AIS
INDICATION: Specific Reason Freetext Ischemic Stroke/Transient Ischemic                  
 Attack. STROKE PROTOCOL: PHONE RESULTS TO ORDERING PHYSICIAN.                             
 Pertinent History: Sudden onset of headache and left sided numbness starting              
 2422.                                                                                     
 Surgical History:                                                                         
 Cancer: None                                                                              
 Technologist Comments: None
TECHNIQUE: Helical acquisition with sagittal and coronal reformats.                       
 Utilized dose reduction techniques include: Vendor specific iterative                     
 reconstruction technique

[Series 2: head 3mm stnd · axial · 0.47mm/px · z∈[-639,-495]mm · 9 of 56 slices shown, 12 images]
[im 4/56  brain]
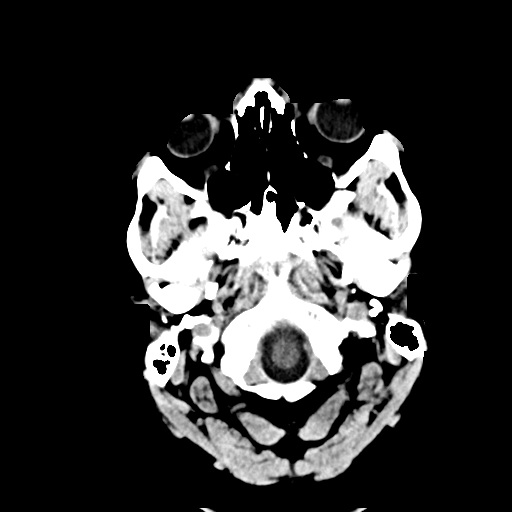
[im 4/56  bone]
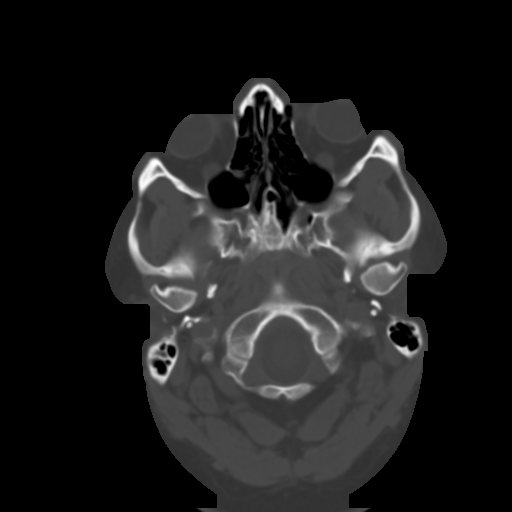
[im 12/56  brain]
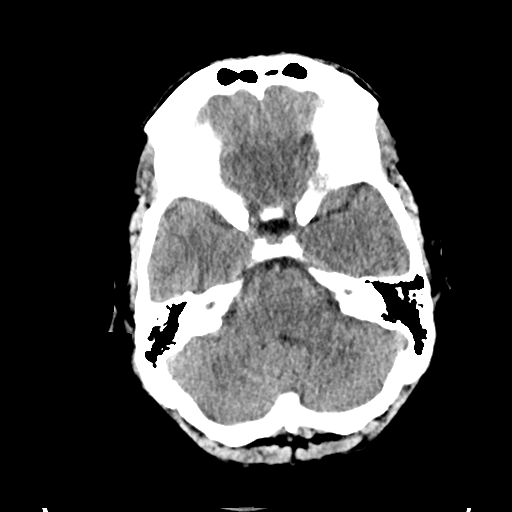
[im 16/56  brain]
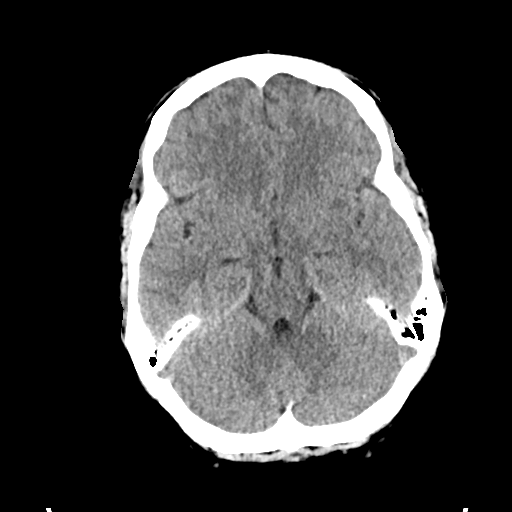
[im 24/56  brain]
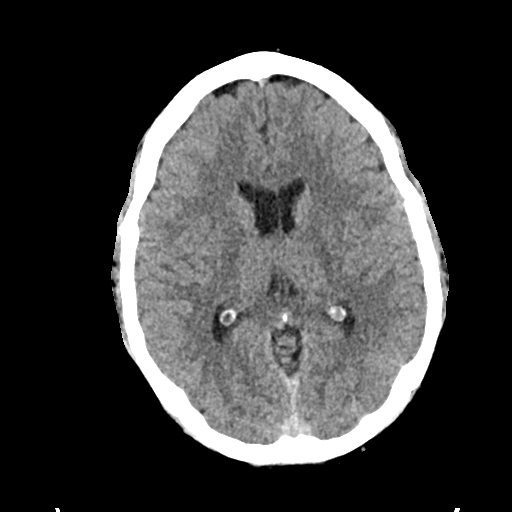
[im 28/56  brain]
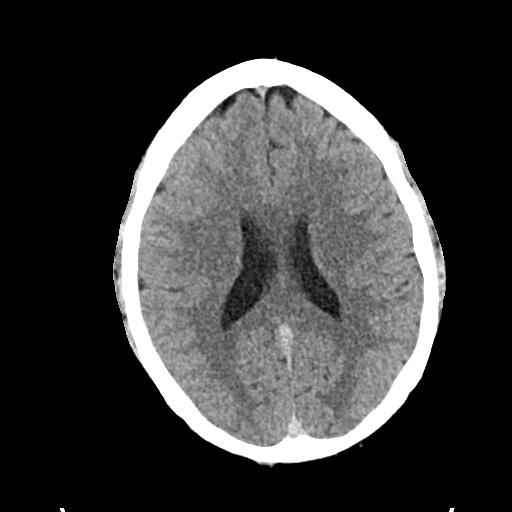
[im 28/56  bone]
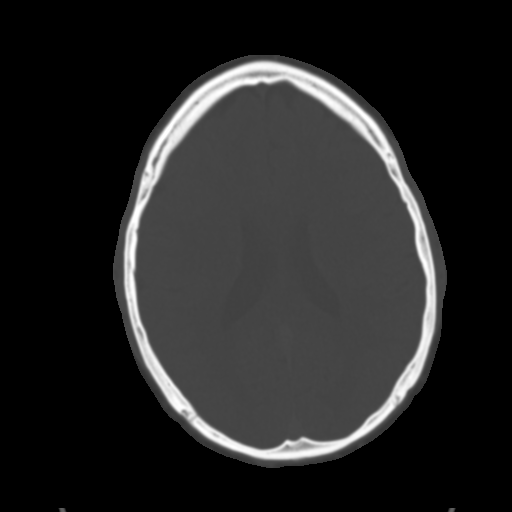
[im 32/56  brain]
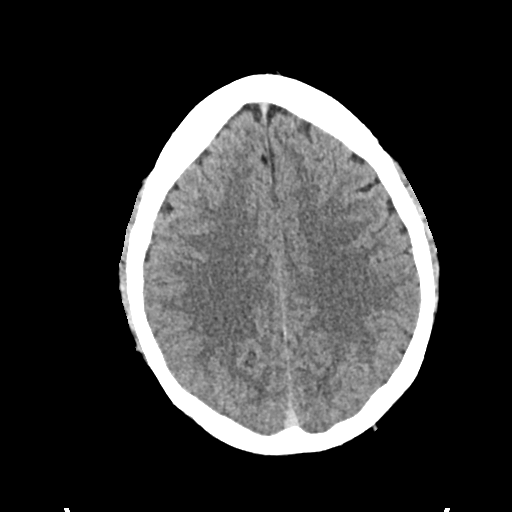
[im 40/56  brain]
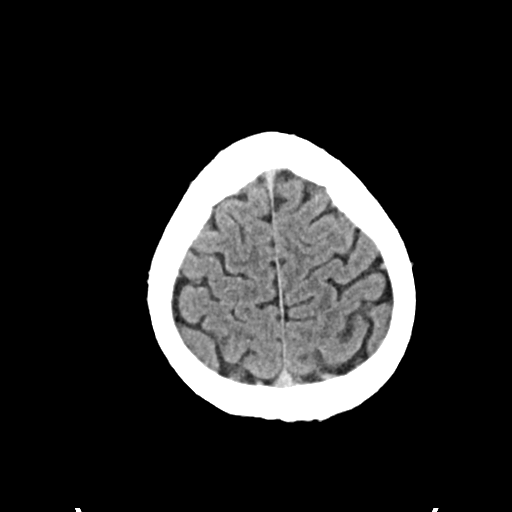
[im 44/56  brain]
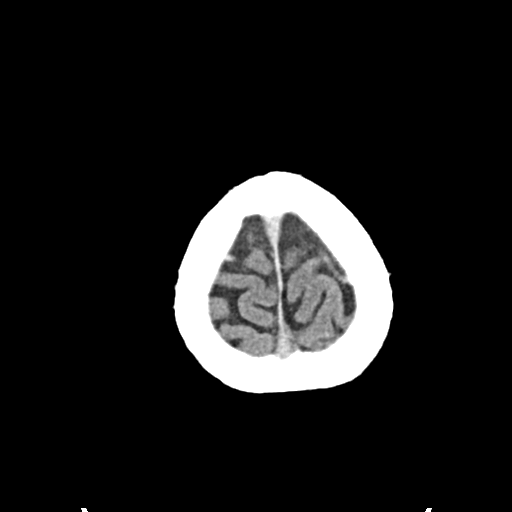
[im 52/56  brain]
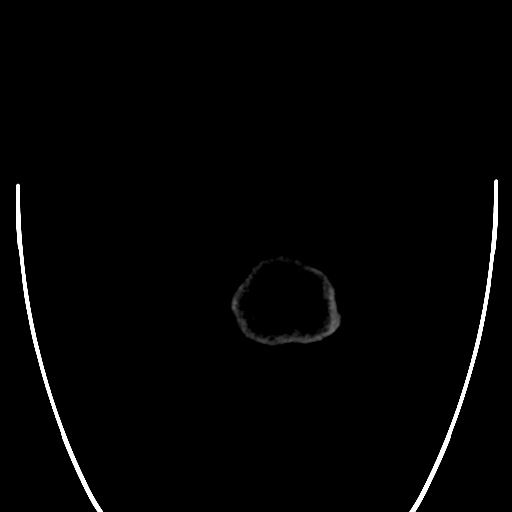
[im 52/56  bone]
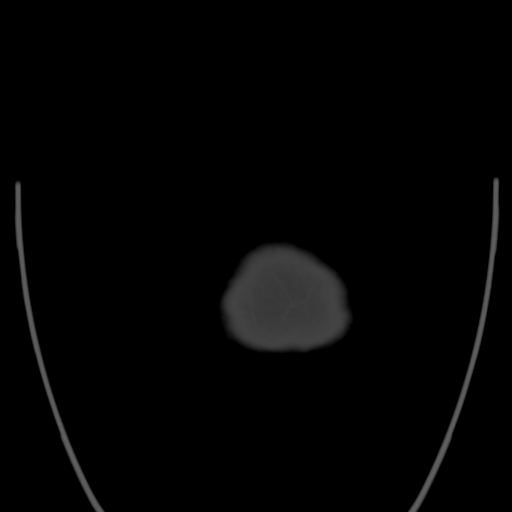

[Series 4: cor 3x3 · coronal · 0.49mm/px · 3 of 79 slices shown]
[im 27/79  brain]
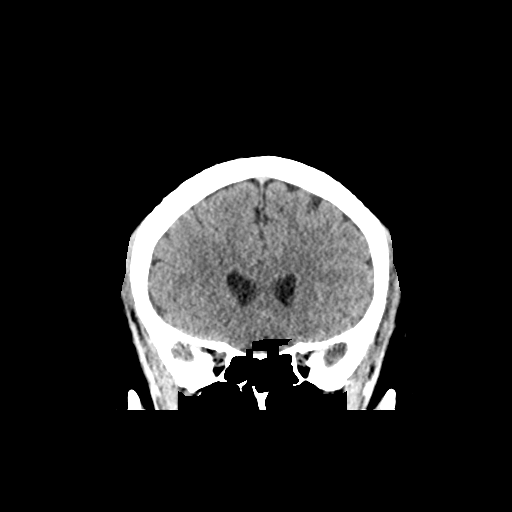
[im 35/79  brain]
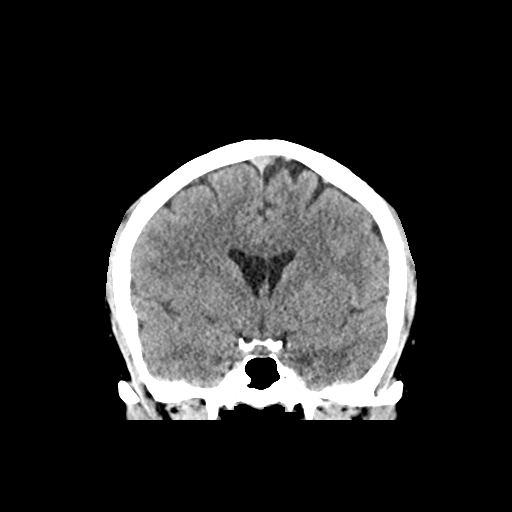
[im 44/79  brain]
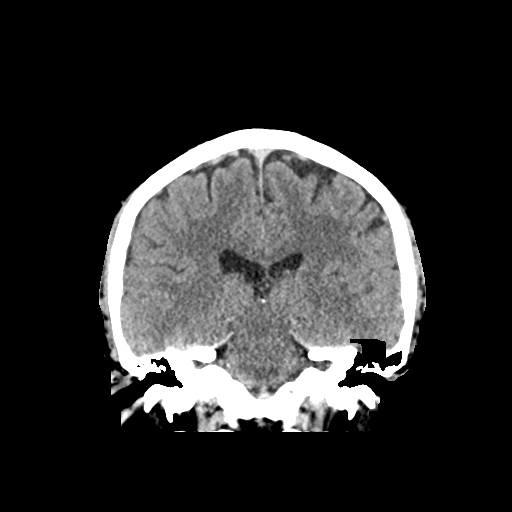

[Series 5: sag 3x3 · sagittal · 0.49mm/px · 3 of 69 slices shown]
[im 23/69  brain]
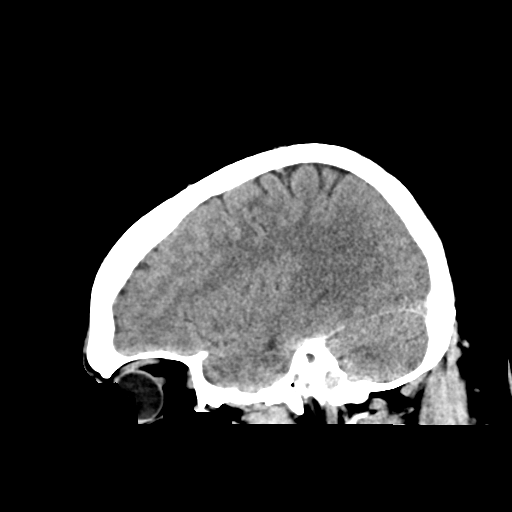
[im 35/69  brain]
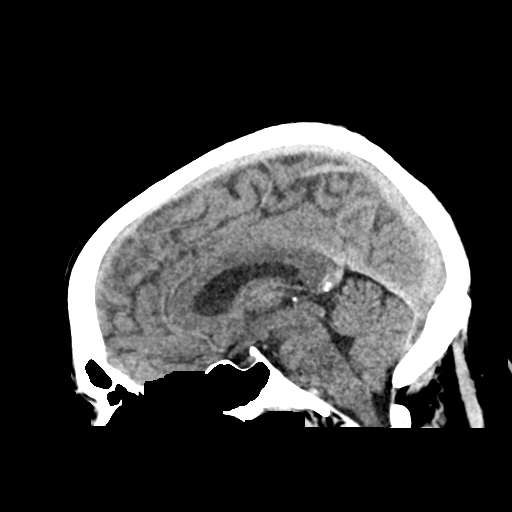
[im 46/69  brain]
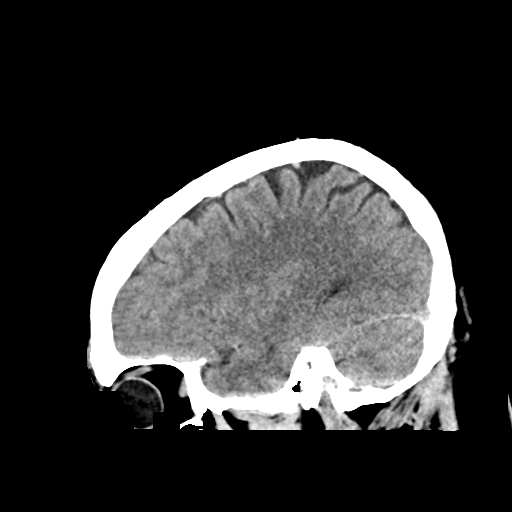

[15 of 47 positions shown; findings below may reference images not displayed]

FINDINGS: ASPECTS: Please note is difficult to accurately score patients with extensive             
 white matter disease. Regions of chronic ischemic infarction are also not                 
 included in the score. Grading performed of bilateral MCA regions. Areas                  
 assessed include the caudate, internal capsule, lentiform nucleus, insular                
 each area of involvement.                                                                 
 Abnormal Regions: None.                                                                   
 Total ASPECT score:  10, normal.                                                          
 HEMORRHAGE: No acute intracranial hemorrhage.                                             
 BRAIN PARENCHYMA, ADDITIONAL: Normal.                                                     
 BASAL CISTERNS AND EXTRA AXIAL SPACES:  Normal.                                           
 VENTRICULAR SYSTEM: Normal for patient's age.                                             
 MIDLINE SHIFT: None.                                                                      
 CALVARIUM: Normal.                                                                        
 VISUALIZED PARANASAL SINUSES: Clear.                                                      
 VISUALIZED ORBITS: Normal.                                                                
 OTHER: None.
IMPRESSION: 1.  No acute intracranial abnormality.                                                    
 2.  ASPECT score: 10, normal.                                                             
 3.  Case was discussed with Dr. Buezo at [DATE].

## 2022-05-14 ENCOUNTER — Ambulatory Visit (INDEPENDENT_AMBULATORY_CARE_PROVIDER_SITE_OTHER): Payer: No Typology Code available for payment source | Admitting: Physician Assistant

## 2022-05-14 ENCOUNTER — Encounter (INDEPENDENT_AMBULATORY_CARE_PROVIDER_SITE_OTHER): Payer: Self-pay | Admitting: Physician Assistant

## 2022-05-14 VITALS — BP 128/86 | HR 74 | Temp 98.2°F | Resp 18 | Ht 70.0 in | Wt 218.0 lb

## 2022-05-14 DIAGNOSIS — R21 Rash and other nonspecific skin eruption: Secondary | ICD-10-CM

## 2022-05-14 MED ORDER — VALACYCLOVIR HCL 1 G PO TABS
1000.0000 mg | ORAL_TABLET | Freq: Three times a day (TID) | ORAL | 0 refills | Status: AC
Start: 2022-05-14 — End: 2022-05-21

## 2022-05-14 MED ORDER — DESONIDE 0.05 % EX CREA
TOPICAL_CREAM | Freq: Two times a day (BID) | CUTANEOUS | 0 refills | Status: AC
Start: 2022-05-14 — End: 2022-05-21

## 2022-05-14 MED ORDER — HYDROXYZINE HCL 25 MG PO TABS
25.0000 mg | ORAL_TABLET | Freq: Three times a day (TID) | ORAL | 0 refills | Status: DC | PRN
Start: 2022-05-14 — End: 2023-03-17

## 2022-05-14 NOTE — Patient Instructions (Signed)
Take the Valcyclovir three times a day with food and a full glass of water.     STOP taking Lamictal, discuss with your doctor about changing the medication.    For itching, apply the desonie as prescribed    Take the Hydroxizine (this can make you tired, do not drive or consume alcohol with this. It is not an antibiotic, so if you must drive or will be consuming alcohol, do not take the dose).     Topical Benadryl can help with the itching. Do not double up benadryl (oral) with hydroxizine.    As we cannot rule out shingles until the results return, treat as shingles.  Wash your hands a lot.   Put a t shirt over your pillow case and change the t-shirt every morning.   Use a hand towel to dry your face.     If symptoms worsen, please go to the emergency room.

## 2022-05-14 NOTE — Progress Notes (Signed)
[facility]  URGENT  CARE  PROGRESS NOTE     Patient: Kurt Rogers   Date: 05/14/2022   MRN: [id number]       Kurt Rogers is a 31 y.o. male      HISTORY     History obtained from: Patient    Chief Complaint   Patient presents with    Rash     Rash on right side of face. Itching. Noticed yesterday. Started taking Lamictal about 2 weeks tomorrow. Was told to be checked if any side effects occurred.          Rash     Pt presents with an itchy painful rash that has grown on the right side of his face over the past 2 days. He notes 12 days prior to onset of rash he started taking lamictal. Notes he had not shaved for 5 days prior to rash onset, he denies any new soaps, lotions or detergents, he has not placed anything on the rash.   Denies any other symptoms.     Review of Systems   Skin:  Positive for rash.    ROS negative unless noted in hpi     History:    Pertinent Past Medical, Surgical, Family and Social History were reviewed.        Current Outpatient Medications:     amphetamine-dextroamphetamine (ADDERALL XR) 20 MG 24 hr capsule, Take 10 mg by mouth 2 (two) times daily, Disp: , Rfl:     aspirin EC 81 MG EC tablet, Take 1 tablet (81 mg) by mouth daily, Disp: , Rfl:     FLUoxetine (PROzac) 20 MG capsule, Take 1 capsule (20 mg) by mouth daily, Disp: , Rfl:     lamoTRIgine (LaMICtal) 25 MG tablet, TAKE 1 TABLET BY MOUTH BEFORE BEDTIME ON 05/15/22 TAKE 2 TABLETS BY MOUTH DAILY BEFORE BEDTIME, Disp: , Rfl:     ondansetron (ZOFRAN-ODT) 4 MG disintegrating tablet, Take 1 tablet (4 mg) by mouth every 6 (six) hours as needed for Nausea (Patient not taking: Reported on 04/06/2022), Disp: 8 tablet, Rfl: 0    Allergies   Allergen Reactions    Latex     Shellfish-Derived Products Angioedema       Medications and Allergies reviewed.    PHYSICAL EXAM     Vitals:    05/14/22 1644   BP: 128/86   BP Site: Left arm   Patient Position: Sitting   Cuff Size: Large   Pulse: 74   Resp: 18   Temp: 98.2 F (36.8 C)   TempSrc:  Tympanic   SpO2: 97%   Weight: 98.9 kg (218 lb)   Height: 1.778 m (5\' 10" )       Physical Exam  Skin:     Comments: Erythematous linear scabbed rash on right cheek, no induration or fluctuance.  Rash is approximately 3.5 inches in length          UCC COURSE     There were no labs reviewed with this patient during the visit.    There were no x-rays reviewed with this patient during the visit.    No current facility-administered medications for this visit.       PROCEDURES     Procedures    MEDICAL DECISION MAKING     History, physical, labs/studies most consistent with rash as the diagnosis.    Chart Review:  Prior PCP, Specialist and/or ED notes reviewed today: No  Prior labs/images/studies reviewed today: No  Differential Diagnosis: Differential Diagnosis: Eczema, Atopic Dermatitis, Contact Dermatitis, Viral Exanthem, Fungal Infection, Cellulitis, Insect Bite, Allergic Drug Reaction, Stasis Dermatitis, Shingles, Erythema Migrans, Pityriasis Rosea.    Pt recently started lamictal, advised discontinue this medication, reach out to prescribing doctor. Site swabbed for varicella, pt prescribed hydroxizine and desonide for itching, prescribed valcyclovir for shingles. Discharged home with normal vitals      ASSESSMENT     No diagnosis found.             PLAN      PLAN: see MDM            No orders of the defined types were placed in this encounter.    Requested Prescriptions      No prescriptions requested or ordered in this encounter       Discussed results and diagnosis with patient/family.  Reviewed warning signs for worsening condition, as well as, indications for follow-up with primary care physician and return to urgent care clinic.   Patient/family expressed understanding of instructions.     An After Visit Summary was provided to the patient.

## 2022-05-16 ENCOUNTER — Telehealth (INDEPENDENT_AMBULATORY_CARE_PROVIDER_SITE_OTHER): Payer: Self-pay | Admitting: Family Medicine

## 2022-05-16 DIAGNOSIS — R21 Rash and other nonspecific skin eruption: Secondary | ICD-10-CM

## 2022-05-16 NOTE — Telephone Encounter (Signed)
Order placed for correct vcz order

## 2022-05-19 LAB — VZV DNA, QL PCR: VZV DNA QL PCR: NOT DETECTED

## 2022-05-21 ENCOUNTER — Encounter (INDEPENDENT_AMBULATORY_CARE_PROVIDER_SITE_OTHER): Payer: Self-pay | Admitting: Family

## 2022-05-21 ENCOUNTER — Ambulatory Visit (INDEPENDENT_AMBULATORY_CARE_PROVIDER_SITE_OTHER): Payer: No Typology Code available for payment source | Admitting: Family

## 2022-05-21 VITALS — BP 127/86 | HR 76 | Temp 98.0°F | Resp 18 | Ht 70.0 in | Wt 219.0 lb

## 2022-05-21 DIAGNOSIS — R21 Rash and other nonspecific skin eruption: Secondary | ICD-10-CM

## 2022-05-21 DIAGNOSIS — B354 Tinea corporis: Secondary | ICD-10-CM

## 2022-05-21 MED ORDER — CLOTRIMAZOLE 1 % EX CREA
TOPICAL_CREAM | Freq: Two times a day (BID) | CUTANEOUS | 0 refills | Status: AC
Start: 2022-05-21 — End: 2022-06-20

## 2022-05-21 NOTE — Progress Notes (Signed)
[facility]  URGENT  CARE  PROGRESS NOTE     Patient: Kurt Rogers   Date: 05/21/2022   MRN: [id number]       Kurt Rogers is a 31 y.o. male      HISTORY     History obtained from: Patient    Chief Complaint   Patient presents with    Rash     Rash in both armpits since Sunday. Itchy, burning sensation.   Was here Friday for facial rash, not shingles.  No SOB, cough, or fevers.  Previously seen by dermatologist for rashes.          Rash     31  year old male history of mitral valve disease presents urgent care clinic concerns for itchy rash to both underarms for the last 5 days.  Started out as small patches and has progressively gotten larger.  Denies any discharge from rash and denies any other lesions elsewhere.  Denies any new foods, medications, skin products/deodorant, difficulty speaking/swallowing, chest pain, shortness of breath, wheezing, abdominal pain, nausea, vomiting, diarrhea, sore throat, or fevers.  Has not tried anything to help with symptoms.    Was seen last week for facial rash from possible allergic reaction to Lamictal.  States rash in face has resolved.    Review of Systems   Skin:  Positive for rash.       History:    Pertinent Past Medical, Surgical, Family and Social History were reviewed.        Current Outpatient Medications:     amphetamine-dextroamphetamine (ADDERALL XR) 20 MG 24 hr capsule, Take 10 mg by mouth 2 (two) times daily, Disp: , Rfl:     aspirin EC 81 MG EC tablet, Take 1 tablet (81 mg) by mouth daily, Disp: , Rfl:     desonide (DESOWEN) 0.05 % cream, Apply topically 2 (two) times daily for 7 days, Disp: 15 g, Rfl: 0    FLUoxetine (PROzac) 20 MG capsule, Take 1 capsule (20 mg) by mouth daily, Disp: , Rfl:     hydrOXYzine (ATARAX) 25 MG tablet, Take 1 tablet (25 mg) by mouth every 8 (eight) hours as needed for Itching, Disp: 20 tablet, Rfl: 0    clotrimazole (LOTRIMIN) 1 % cream, Apply topically 2 (two) times daily For 4 weeks, Disp: 28 g, Rfl: 0    lamoTRIgine  (LaMICtal) 25 MG tablet, TAKE 1 TABLET BY MOUTH BEFORE BEDTIME ON 05/15/22 TAKE 2 TABLETS BY MOUTH DAILY BEFORE BEDTIME (Patient not taking: Reported on 05/21/2022), Disp: , Rfl:     ondansetron (ZOFRAN-ODT) 4 MG disintegrating tablet, Take 1 tablet (4 mg) by mouth every 6 (six) hours as needed for Nausea (Patient not taking: Reported on 04/06/2022), Disp: 8 tablet, Rfl: 0    valACYclovir (VALTREX) 1000 MG tablet, Take 1 tablet (1,000 mg) by mouth 3 (three) times daily for 7 days (Patient not taking: Reported on 05/21/2022), Disp: 21 tablet, Rfl: 0    Allergies   Allergen Reactions    Latex     Shellfish-Derived Products Angioedema       Medications and Allergies reviewed.    PHYSICAL EXAM     Vitals:    05/21/22 1811   BP: 127/86   Pulse: 76   Resp: 18   Temp: 98 F (36.7 C)   SpO2: 98%   Weight: 99.3 kg (219 lb)   Height: 1.778 m (5\' 10" )       Physical Exam  Constitutional:  General: He is not in acute distress.     Appearance: Normal appearance. He is well-developed.   HENT:      Head: Normocephalic and atraumatic.      Nose: Nose normal.      Mouth/Throat:      Mouth: Mucous membranes are moist.      Pharynx: No oropharyngeal exudate.     Eyes: Conjunctivae and EOM are normal. Pupils are equal, round, and reactive to light. No scleral icterus. Neck:      Thyroid: No thyroid mass or thyromegaly.      Vascular: No carotid bruit.   Cardiovascular:      Rate and Rhythm: Normal rate and regular rhythm.      Pulses: Normal pulses.      Heart sounds: Normal heart sounds. No murmur heard.  Pulmonary:      Effort: Pulmonary effort is normal. No respiratory distress.      Breath sounds: Normal breath sounds. No wheezing.   Abdominal:      General: Bowel sounds are normal. There is no distension.      Palpations: Abdomen is soft. There is no mass.      Tenderness: There is no abdominal tenderness.   Musculoskeletal:         General: Normal range of motion.      Cervical back: Normal range of motion and neck supple.    Lymphadenopathy:      Cervical: No cervical adenopathy.   Neurological:      Mental Status: He is alert and oriented to person, place, and time.      Cranial Nerves: No cranial nerve deficit.      Sensory: No sensory deficit.      Gait: Gait normal.      Deep Tendon Reflexes: Reflexes are normal and symmetric.   Skin:     General: Skin is warm and dry.      Findings: Rash present.      Comments: Hyperpigmented rash with demarcated borders noted to bilateral underarms.  Nonvesicular, not purpuric, not petechial.  No lymphatic streaking, induration, or fluctuance noted.  Appears to be tinea corporis.   Psychiatric:         Mood and Affect: Mood normal.         Speech: Speech normal.         Behavior: Behavior normal.   Vitals and nursing note reviewed.           UCC COURSE     There were no labs reviewed with this patient during the visit.    There were no x-rays reviewed with this patient during the visit.    No current facility-administered medications for this visit.       PROCEDURES     Procedures    MEDICAL DECISION MAKING     History, physical, labs/studies most consistent with acute rash / tinea corporis as the diagnosis.    Chart Review:  Prior PCP, Specialist and/or ED notes reviewed today: No  Prior labs/images/studies reviewed today: No    Differential Diagnosis: Acute rash/urticaria, contact dermatitis, poison ivy dermatitis, tinea infection, cellulitis, skin abscess, anaphylaxis, environmental/drug/food allergy, shingles, insect bite, intertrigo, hydradenitis                ASSESSMENT     Encounter Diagnoses   Name Primary?    Rash and other nonspecific skin eruption Yes    Tinea corporis  PLAN      PLAN: VSS, afebrile, nontachy, appears to be in no acute distress, lung sounds clear, abdomen soft and nontender palpation.  TM's pearly gray, oropharynx nonerythematous and not exudative.  Is nontoxic-appearing.    Rash to underarms appears to be from tinea corporis.  Concern for cellulitis,,  dermatitis, or hidradenitis is low.  We will put patient a course of clotrimazole ointment and advised follow-up with dermatology/PCP.    Strict ER precautions discussed.  Advised follow up with derm/PCP.  Indicated understanding and agreed to treatment plan.              Orders Placed This Encounter   Procedures    Ambulatory referral to Dermatology     Requested Prescriptions     Signed Prescriptions Disp Refills    clotrimazole (LOTRIMIN) 1 % cream 28 g 0     Sig: Apply topically 2 (two) times daily For 4 weeks       Discussed results and diagnosis with patient/family.  Reviewed warning signs for worsening condition, as well as, indications for follow-up with primary care physician and return to urgent care clinic.   Patient/family expressed understanding of instructions.     An After Visit Summary was provided to the patient.

## 2022-05-21 NOTE — Patient Instructions (Signed)
Your symptoms appear to be from a fungal skin infection/ringworm.  Please take clotrimazole cream as instructed.  Keep area clean and dry.  Avoid scratching area.    Please return or go to the ER for any new or worsening symptoms that concern you.  Follow-up with dermatology or family doctor in 3-5 days.

## 2022-05-22 NOTE — ED Provider Notes (Signed)
[facility] / Department of Emergency Medicine  History and Physical Exam     Patient Name: Kurt Rogers   Date of Birth: 19-Feb-1991  Medical Record Number: [id number]     History of Present Illness     Chief Complaint:   Chief Complaint   Patient presents with   . Weakness - Generalized     BIBEMS with reports of taking a nap and woke up and could not hear about of his left ear. Pt had abnormal grip, weakness in left side. Pt had this 2 year ago and stroke was negative but lesions on brain and pt said working with MD on possible MS diagnosis. Symptoms started 1530.         History Provided By:  Patient     History: Kurt Rogers is a 31 y.o. male with past medical history of bipolar disorder presenting to the ED via EMS with chief complaint of left-sided weakness and left-sided hearing loss that began roughly 4 hours prior to arrival.  Patient states that he woke up from a nap after going to bed at roughly 2 PM, 4 hours ago, when he woke up and was unable to hear out of his left ear.  He went to urgent care and was noted to also have left-sided weakness and EMS was called.  Patient states that he had similar presentation to the ED a couple years ago and was diagnosed with possible multiple sclerosis after some lesions were found in his brain on MRI.  He is not currently taking any medications for this.  He denies any blurred vision, confusion, difficulty speaking, or any other symptoms at this time.     Primary care provider: No PCP, MD      Past History  Past Medical History:    Past Medical History:   Diagnosis Date   . ADHD         Past Surgical History:   Past Surgical History:   Procedure Laterality Date   . TONSILLECTOMY         Allergies:   Allergies   Allergen Reactions   . Shellfish Containing Products    . Latex Hives, Itching and Rash          Physical Exam  ED Triage Vitals [05/22/22 1748]   Temp Heart Rate Resp BP SpO2   36.6 C (97.8 F) 81 18 135/88 96 %      Temp Source Heart Rate  Source Patient Position BP Location FiO2 (%) Set   Temporal Monitor Sitting Right arm --       Physical Exam  Vitals and nursing note reviewed.   Constitutional:       General: He is not in acute distress.     Appearance: Normal appearance. He is well-developed.   HENT:      Head: Normocephalic and atraumatic.   Eyes:      Extraocular Movements: Extraocular movements intact.      Conjunctiva/sclera: Conjunctivae normal.      Pupils: Pupils are equal, round, and reactive to light.   Cardiovascular:      Rate and Rhythm: Normal rate and regular rhythm.   Pulmonary:      Effort: Pulmonary effort is normal.      Breath sounds: Normal breath sounds.   Skin:     General: Skin is warm and dry.      Capillary Refill: Capillary refill takes less than 2 seconds.   Neurological:  Mental Status: He is alert and oriented to person, place, and time.      Comments: 3-5 strength to left upper extremity with positive drift and diminished grip strength.  4-5 strength to lower extremity on the left side.  5 out of 5 strength on right side.  Subjective left-sided hearing loss.   Psychiatric:         Mood and Affect: Mood normal.          Diagnostic Study Results : See end of note      MDM:    Independent Historian: None    External Data Reviewed: None available    Differentials considered include, but are not limited to: CVA, TIA, multiple sclerosis or other demyelinating disease    Laboratory analysis including labs ordered, interpreted by me: Labs largely within normal limits.    Radiology studies ordered, interpreted by me in concurrence with radiology interpretation: Initial CT stroke protocol with angiogram and perfusion study with no acute abnormality.  MRI brain with nonspecific lesions.    ECG analysis interpreted by me in absence of cardiologist: Sinus, 77.  No acute ischemic changes.  Normal intervals.    Cardiac monitor analysis: Sinus rhythm, 70s.  Normotensive, normoxic on room air.    Consultants Involved: Spoke with  Dr. Audelia Hives, neurology.  Recommended against TNK administration due to low NIH score.  Initially recommended MRI with and without contrast of the brain which showed nonspecific lesions as above; given ongoing symptoms, agrees with plan for admission.  Spoke with Dr. Lenore Manner, who will admit the patient to observation.    Tests and/or treatments considered, but not completed today: TNK, MRI spine    ED Course/Disposition decision: Patient is a 31 year old male presenting to the ED with left-sided weakness and hearing loss of unclear etiology, possibly in setting of demyelinating disease.  On examination, patient does have focal weakness to the left side without any cranial nerve deficits.  Stroke protocol called as above and imaging largely within normal limits with CT.  MRI abnormal as above.  Given ongoing symptoms, will plan to admit the patient to the hospitalist service for further evaluation and evaluation by neurology.  Patient verbalized understanding of the plan of care and he is stable for admission at this time.    Medications administered: IV lorazepam prior to MRI    Prescription drug management: None    Social determinants of health limiting diagnosis/treatment and/or creating barriers to follow up: None      I, Dr. Simonne Come. Manuella Ghazi, MD, am the ED physician of record for this patient. I reviewed the vital signs, available nursing notes, past medical history, past surgical history, family history and social history.           Vital Signs-Reviewed the patient's vital signs.   Visit Vitals  BP 124/73 (BP Location: Left arm)   Pulse 76   Temp 36.8 C (98.2 F) (Oral)   Resp 16   Ht 1.803 m (5' 10.98")   Wt 98.2 kg (216 lb 7.9 oz)   SpO2 95%   BMI 30.21 kg/m   Smoking Status Never   BSA 2.18 m       ED Course as of 05/23/22 2104   Fri May 22, 2022   1809 Paged Dr. Audelia Hives, on call neurologist    This note was prepared by Manuela Schwartz Mateo Flow) Sofie Hartigan, acting as a scribe for Dr. Manuella Ghazi on 05/22/2022 at 6:09 PM who  reviewed for accuracy and agrees with  the above documentation. [SC]   2220 Paged Dr. Audelia Hives, on call [facility] neurologist via [place].    This note was prepared by Ermalene Postin acting as a scribe for Dr. Manuella Ghazi on 05/22/2022 at 10:21 PM who reviewed for accuracy and agrees with the above documentation. [MK]   2315 Paged hospitalist [VS]      ED Course User Index  [MK] Megan Kassem  [SC] Scherrie Merritts  [VS] Marijean Niemann, MD     Critical Care    Performed by: Marijean Niemann, MD  Authorized by: Marijean Niemann, MD    Critical care provider statement:     Critical care time (minutes):  45    Critical care time was exclusive of:  Separately billable procedures and treating other patients and teaching time    Critical care was necessary to treat or prevent imminent or life-threatening deterioration of the following conditions:  CNS failure or compromise    Critical care was time spent personally by me on the following activities:  Ordering and performing treatments and interventions, ordering and review of laboratory studies, ordering and review of radiographic studies, discussions with consultants, re-evaluation of patient's condition, examination of patient and obtaining history from patient or surrogate    I assumed direction of critical care for this patient from another provider in my specialty: no      Care discussed with: admitting provider            _______________________________     Some transcription errors may be present due to use of Dragon (voice recognition) software.     Disposition and Diagnosis     Diagnoses for today's visit:   1. Left-sided weakness      Current Discharge Medication List          Results of Diagnostic Study  Labs -   Labs Reviewed   COMPREHENSIVE METABOLIC PANEL - Abnormal       Result Value    Sodium 138      Potassium 4.0      Chloride 104      CO2 24      BUN 21 (*)     Creatinine 0.81      Glucose 98      Calcium 9.5      AST 33      ALT (SGPT) 57 (*)     Alkaline Phosphatase 77       Total Protein 7.4      Albumin 4.8      eGFR 121      Total Bilirubin 0.40     URINALYSIS - Abnormal    Color, Urine Yellow      Clarity, Urine Clear      Specific Gravity, Urine 1.070 (*)     pH, Urine 6.0      Leukocytes, Urine Negative      Nitrite, Urine Negative      Protein, Urine Negative      Glucose, Urine Negative      Ketones, Urine Negative      Urobilinogen, Urine 0.2      Bilirubin, Urine Negative      Blood, Urine Negative     CBC WITH AUTO DIFFERENTIAL - Abnormal    Auto WBC 9.4      RBC 5.75 (*)     Hemoglobin 15.7      Hematocrit 44.6      MCV 77.6 (*)     MCH 27.3 (*)  MCHC 35.2      RDW 12.4      Platelets 355      MPV 8.9 (*)     nRBC 0      Neutrophils Relative 55.0      Lymphocytes Relative 33.8      Monocytes Relative 7.3      Eosinophils Relative 3.1      Basophils Relative 0.6      IG% 0.20      Neutrophils Absolute 5.18      Lymphocytes Absolute 3.19      Monocytes Absolute 0.69      Eosinophils Absolute 0.29      Basophils Absolute 0.06      Immature Granulocytes Absolute 0.02     PHOSPHORUS - Abnormal    Phosphorus 4.9 (*)    TROPONIN I - Normal    Troponin I <0.012      Narrative:     0.120 ng/mL or greater is indicative of acute myocardial infarction.    APTT - Normal    aPTT 32     PROTIME-INR - Normal    Protime 11.4      INR 0.99      Narrative:     RECOMMENDED RANGES FOR INR:    2.0-3.0 for most medical and surgical thromboembolitic states    2.5-3.5 for mechanical prosthetic heart valves     It is recommended that the INR is to be used only for the management of patients on stable (>2 weeks) oral anticoagulant therapy.       MAGNESIUM - Normal    Magnesium 2.1     FERRITIN - Normal    Ferritin 60.90     POCT GLUCOSE METER (LAB ONLY) - Normal    Glucose 97     POCT GLUCOSE METER (LAB ONLY) - Normal    Glucose 91     POCT GLUCOSE METER (LAB ONLY) - Normal    Glucose 94     POCT GLUCOSE METER (LAB ONLY) - Normal    Glucose 84     POCT GLUCOSE METER (LAB ONLY) - Normal    Glucose  97     CBC WITH DIFFERENTIAL    Narrative:     The following orders were created for panel order CBC and differential.  Procedure                               Abnormality         Status                     ---------                               -----------         ------                     CBC Auto Differential[[id number]]         Abnormal            Final result                 Please view results for these tests on the individual orders.   TYPE AND SCREEN    Antibody Screen NEG      ABO/Rh B POS      Narrative:     Release  to patient->Immediate   IRON TIBC IRON SATURATION    Iron 118      TIBC 387      Iron Saturation 30          Radiologic Studies -  Chest   X-Ray  1 View   Final Result   IMPRESSION:      1.  No evidence of acute cardiopulmonary disease .      Electronically signed by Priscille Kluver, MD on 05/22/2022 8:16 PM      MRI Brain with and without contrast   Final Result   IMPRESSION:      No evidence of recent infarct, acute intracranial hemorrhage or mass effect. No abnormal intracranial enhancement.      Several small foci of T2 FLAIR hyperintensity at the inferior left frontal lobe; these are nonspecific, may reflect sequela of an infectious or inflammatory process, including migraines. No accompanying enhancement to suggest acute inflammatory process. Otherwise grossly normal parenchymal signal.      Electronically signed by Lyman Bishop, MD on 05/22/2022 7:49 PM      CTAngiogram Head / Neck - STROKE Protocol   Final Result   IMPRESSION:      1. No evidence of acute territorial infarct, intracranial hemorrhage or mass effect.      2. No significant CCA, cervical ICA or vertebral artery stenosis.      3. No evidence of severe intracranial arterial stenosis, proximal branch occlusion, aneurysm larger than 2 mm, or vascular malformation.      4. Concurrently performed perfusion imaging demonstrates 5 mL area of slightly delayed transit time in the right temporal lesion, of uncertain/doubtful clinical  significance, likely artifactual.      NOTE:      Estimated and quantified carotid stenoses are made utilizing the NASCET criteria which is based on the diameter or area at the stenosis relative to the diameter or area of the NORMAL distal cervical internal carotid artery.      THIS REPORT CONTAINS FINDINGS THAT MAY BE CRITICAL TO PATIENT CARE. The findings were communicated via EPIC to and acknowledged by Dr. Manuella Ghazi on 05/22/2022 6:39 PM      PQRS: [id number]      Electronically signed by Lyman Bishop, MD on 05/22/2022 6:46 PM      CT Head - Perfusion Analysis   Final Result   IMPRESSION:      1. No evidence of acute territorial infarct, intracranial hemorrhage or mass effect.      2. No significant CCA, cervical ICA or vertebral artery stenosis.      3. No evidence of severe intracranial arterial stenosis, proximal branch occlusion, aneurysm larger than 2 mm, or vascular malformation.      4. Concurrently performed perfusion imaging demonstrates 5 mL area of slightly delayed transit time in the right temporal lesion, of uncertain/doubtful clinical significance, likely artifactual.      NOTE:      Estimated and quantified carotid stenoses are made utilizing the NASCET criteria which is based on the diameter or area at the stenosis relative to the diameter or area of the NORMAL distal cervical internal carotid artery.      THIS REPORT CONTAINS FINDINGS THAT MAY BE CRITICAL TO PATIENT CARE. The findings were communicated via EPIC to and acknowledged by Dr. Manuella Ghazi on 05/22/2022 6:39 PM      PQRS: [id number]      Electronically signed by Lyman Bishop, MD on 05/22/2022 6:46 PM      CT  Head - STROKE Protocol   Final Result   IMPRESSION:      No evidence of acute territorial infarct, intracranial hemorrhage or mass effect.      THIS REPORT CONTAINS FINDINGS THAT MAY BE CRITICAL TO PATIENT CARE. The findings were communicated via EPIC to and acknowledged by Dr. Manuella Ghazi on 05/22/2022 6:30 PM      Electronically signed by Lyman Bishop,  MD on 05/22/2022 6:31 PM      MRI Cervical Spine with and without contrast    (Results Pending)   MRI Thoracic Spine with and without contrast    (Results Pending)        Marijean Niemann, MD  05/23/22 2104

## 2022-06-18 ENCOUNTER — Encounter (INDEPENDENT_AMBULATORY_CARE_PROVIDER_SITE_OTHER): Payer: Self-pay | Admitting: Cardiovascular Disease

## 2022-06-18 DIAGNOSIS — R002 Palpitations: Secondary | ICD-10-CM

## 2022-06-18 NOTE — Procedures (Signed)
--------------------------------------------------------------------------------------------------------------    HOLTER MONITOR REPORT  [facility]  --------------------------------------------------------------------------------------------------------------    Name:  Kurt Rogers, Kurt Rogers   DOB: 1990/12/27        Test Date:  01/13/2022         Referring Physician: Garth Schlatter, MD Octavia Bruckner, MD    Reading Physician: Thana Ates, MD    INDICATION:Holter monitoring was performed secondary to symptoms of palpitations.    INTERPRETATION:     Total Monitoring Period: 24 Days    Predominant Rhythm: NSR    Min BPM: 36  Avg BPM: 74  Max BPM: 180    PVC total: Rare PVCs (0.02 % burden)   PAC: Rare PACs (0.01 % burden)  No atrial fibrillation, flutter, pauses, or episodes of AV Block    Patient Events: 4 patient reported events occurring during sinus tachycardia and normal sinus rhythm    DIAGNOSIS:   1)  No significant arrhythmias. 4 patient reported events occurring during sinus tachycardia and normal sinus rhythm      Thank you for allowing [place] to participate in this patient's care. Please feel free to contact me with any questions.    Signed by:   Elmon Else. Lochlan Grygiel, MD  [facility]

## 2022-07-22 ENCOUNTER — Emergency Department
Admission: EM | Admit: 2022-07-22 | Discharge: 2022-07-22 | Disposition: A | Discharge status: Home / Self Care | Payer: No Typology Code available for payment source | Attending: Emergency Medicine | Admitting: Emergency Medicine

## 2022-07-22 DIAGNOSIS — R21 Rash and other nonspecific skin eruption: Secondary | ICD-10-CM | POA: Insufficient documentation

## 2022-07-22 LAB — COMPREHENSIVE METABOLIC PANEL
ALT: 36 U/L (ref 0–55)
AST (SGOT): 22 U/L (ref 5–41)
Albumin/Globulin Ratio: 1.6 (ref 0.9–2.2)
Albumin: 4.2 g/dL (ref 3.5–5.0)
Alkaline Phosphatase: 84 U/L (ref 37–117)
Anion Gap: 11 (ref 5.0–15.0)
BUN: 12 mg/dL (ref 9.0–28.0)
Bilirubin, Total: 0.3 mg/dL (ref 0.2–1.2)
CO2: 26 mEq/L (ref 17–29)
Calcium: 9.5 mg/dL (ref 8.5–10.5)
Chloride: 106 mEq/L (ref 99–111)
Creatinine: 0.9 mg/dL (ref 0.5–1.5)
Globulin: 2.6 g/dL (ref 2.0–3.6)
Glucose: 97 mg/dL (ref 70–100)
Potassium: 3.9 mEq/L (ref 3.5–5.3)
Protein, Total: 6.8 g/dL (ref 6.0–8.3)
Sodium: 143 mEq/L (ref 135–145)
eGFR: >60 mL/min/{1.73_m2} (ref >=60)

## 2022-07-22 LAB — CBC AND DIFFERENTIAL
Absolute NRBC: 0 10*3/uL (ref 0.00–0.00)
Basophils Absolute Automated: 0.06 10*3/uL (ref 0.00–0.08)
Basophils Automated: 0.8 %
Eosinophils Absolute Automated: 0.15 10*3/uL (ref 0.00–0.44)
Eosinophils Automated: 2.1 %
Hematocrit: 42.2 % (ref 37.6–49.6)
Hgb: 15.3 g/dL (ref 12.5–17.1)
Immature Granulocytes Absolute: 0.02 10*3/uL (ref 0.00–0.07)
Immature Granulocytes: 0.3 %
Instrument Absolute Neutrophil Count: 2.91 10*3/uL (ref 1.10–6.33)
Lymphocytes Absolute Automated: 3.39 10*3/uL — ABNORMAL HIGH (ref 0.42–3.22)
Lymphocytes Automated: 46.9 %
MCH: 27.6 pg (ref 25.1–33.5)
MCHC: 36.3 g/dL — ABNORMAL HIGH (ref 31.5–35.8)
MCV: 76.2 fL — ABNORMAL LOW (ref 78.0–96.0)
MPV: 8.9 fL (ref 8.9–12.5)
Monocytes Absolute Automated: 0.7 10*3/uL (ref 0.21–0.85)
Monocytes: 9.7 %
Neutrophils Absolute: 2.91 10*3/uL (ref 1.10–6.33)
Neutrophils: 40.2 %
Nucleated RBC: 0 /100 WBC (ref 0.0–0.0)
Platelets: 346 10*3/uL (ref 142–346)
RBC: 5.54 10*6/uL (ref 4.20–5.90)
RDW: 12 % (ref 11–15)
WBC: 7.23 10*3/uL (ref 3.10–9.50)

## 2022-07-22 LAB — PT AND APTT
PT INR: 1 (ref 0.9–1.1)
PT: 11.1 s (ref 10.1–12.9)
PTT: 34 s (ref 27–39)

## 2022-07-22 NOTE — EDIE (Signed)
[facility]?NOTIFICATION?07/22/2022 14:48?RONZELL, LABAN M?MRN: [id number]    Criteria Met      5 ED Visits in 12 Months    Security and Safety  No Security Events were found.  ED Care Guidelines  There are currently no ED Care Guidelines for this patient. Please check your facility's medical records system.        Prescription Monitoring Program  000??- Narcotic Use Score  000??- Sedative Use Score  241??- Stimulant Use Score  000??- Overdose Risk Score  - All Scores range from 000-999 with 75% of the population scoring < 200 and on 1% scoring above 650  - The last digit of the narcotic, sedative, and stimulant score indicates the number of active prescriptions of that type  - Higher Use scores correlate with increased prescribers, pharmacies, mg equiv, and overlapping prescriptions  - Higher Overdose Risk Scores correlate with increased risk of unintentional overdose death   Concerning or unexpectedly high scores should prompt a review of the PMP record; this does not constitute checking PMP for prescribing purposes.    E.D. Visit Count (12 mo.)  Facility Visits   [facility] 1   [facility] 4   [facility] 1   Total 6   Note: Visits indicate total known visits.     Recent Emergency Department Visit Summary  Date Facility [facility] Type Diagnoses or Chief Complaint    Jul 22, 2022  [facility]  [name]  [place]  Emergency      Rash      May 22, 2022  [facility]  [place]  [place]  Emergency      Hyperacusis, left ear      Attention-deficit hyperactivity disorder, unspecified type      Weakness      Bipolar disorder, unspecified      Weakness - Generalized      Numbness      Dec 21, 2021  [facility]  [name]  [place]  Emergency      Vomiting, unspecified      Chest pain, unspecified      Diarrhea, unspecified      Chest Pain      flu like symptoms      Dec 19, 2021  [facility] [place]  [place]  Emergency      1. Other chest pain      2.  Attention-deficit hyperactivity disorder, unspecified type      3. Unspecified asthma, uncomplicated      Dec 08, 2021  [facility]  [name]  [place]  Emergency      Syncope and collapse      Chest pain, unspecified      Chest Pain      Dizziness      Shortness of Breath      Chest Pain - SOB      Dec 02, 2021  [facility]  [name]  [place]  Emergency      Chest pain, unspecified      Weakness      Syncope      Chest Pain        Recent Inpatient Visit Summary  No Recent Inpatient Visits were found.  Care Team  Provider Specialty Phone Fax Service Dates   Apolinar Junes, MD Family Medicine   Current      [facility]  This patient has registered at the [facility]  Emergency Department   For more information visit: [email or web address]     PLEASE NOTE:     1.   Any care recommendations and other clinical information are provided as guidelines or for historical purposes only, and providers should exercise their own clinical judgment when providing care.    2.   You may only use this information for purposes of treatment, payment or health care operations activities, and subject to the limitations of applicable [facility] Policies.    3.   You should consult directly with the organization that provided a care guideline or other clinical history with any questions about additional information or accuracy or completeness of information provided.    ? 2023 [facility] - [email or web address]

## 2022-07-22 NOTE — ED Triage Notes (Signed)
Pt c/o rash on legs, mostly on rt leg. Pt states "I started an increased dose of Divalproex ER yesterday. My psychiatrist told me if I started to develop a rash, to come into the ED." Pt denies itchiness. Pt denies throat or lip swelling or any SOB.

## 2022-07-22 NOTE — ED Provider Notes (Signed)
EMERGENCY DEPARTMENT NOTE     Date: 07/22/2022   Patient Name: Kurt Rogers  Attending Physician: Cherlyn Roberts, MD   Advanced Practice Provider: Luvenia Redden, PA    HISTORY OF PRESENT ILLNESS   Independent Historian:No  Translator Used : no    Chief Complaint: Rash     Kurt Rogers is a 31 y.o. male who  has a past medical history of Neurological abnormality, Non-rheumatic mitral valve disease, Numbness, and TIA (transient ischemic attack). (per Epic records), who presents to the ER for rash that developed on legs and arms after increasing dose of depakote yesterday. He was advised by his psychiatrist to come to the ER if a rash developed. Rash is not pruritic or painful.  Slightly itchy although not bothersome. Denies other lip or throat swelling, shortness of breath.  Patient has no other symptoms of coughs sore throat, headache, neck pain.     Additionally he has multiple cat scratches on his lower extremities as well as states that he is Regularly scratches them and has never had an infection from it before.    PCP: Octavia Bruckner, MD  MEDICAL HISTORY     Past Medical History:  Past Medical History:   Diagnosis Date    Neurological abnormality     Non-rheumatic mitral valve disease     Numbness     TIA (transient ischemic attack)        Past Surgical History:  Past Surgical History:   Procedure Laterality Date    TONSILLECTOMY         Social History:  Social History     Socioeconomic History    Marital status: Single   Tobacco Use    Smoking status: Never     Passive exposure: Never    Smokeless tobacco: Never   Vaping Use    Vaping Use: Never used   Substance and Sexual Activity    Alcohol use: Yes     Comment: socially    Drug use: No     Comment: marijuana 1-2x a month (edibles)       Family History:  History reviewed. No pertinent family history.    Outpatient Medication:  Discharge Medication List as of 07/22/2022  5:15 PM        CONTINUE these medications which have NOT CHANGED     Details   amphetamine-dextroamphetamine (ADDERALL XR) 20 MG 24 hr capsule Take 10 mg by mouth 2 (two) times daily, Historical Med      aspirin EC 81 MG EC tablet Take 1 tablet (81 mg) by mouth daily, Historical Med      divalproex, ER, extended release (DEPAKOTE ER) 500 MG 24 hr tablet Take 1 tablet (500 mg) by mouth daily, Historical Med      FLUoxetine (PROzac) 20 MG capsule Take 1 capsule (20 mg) by mouth daily, Historical Med      hydrOXYzine (ATARAX) 25 MG tablet Take 1 tablet (25 mg) by mouth every 8 (eight) hours as needed for Itching, Starting Thu 05/14/2022, Until Mon 07/13/2022 at 2359, E-Rx      lamoTRIgine (LaMICtal) 25 MG tablet TAKE 1 TABLET BY MOUTH BEFORE BEDTIME ON 05/15/22 TAKE 2 TABLETS BY MOUTH DAILY BEFORE BEDTIME, Historical Med      ondansetron (ZOFRAN-ODT) 4 MG disintegrating tablet Take 1 tablet (4 mg) by mouth every 6 (six) hours as needed for Nausea, Starting Sun 12/21/2021, E-Rx      QUEtiapine (SEROquel) 100 MG tablet Take 1  tablet (100 mg) by mouth nightly, Historical Med               REVIEW OF SYSTEMS   Review of Systems  Pertinent ROS as noted above in HPI  PHYSICAL EXAM     ED Triage Vitals   Enc Vitals Group      BP 07/22/22 1454 145/84      Heart Rate 07/22/22 1454 95      Resp Rate 07/22/22 1454 16      Temp 07/22/22 1454 98.1 F (36.7 C)      Temp Source 07/22/22 1454 Oral      SpO2 07/22/22 1454 98 %      Weight 07/22/22 1504 99.8 kg      Height 07/22/22 1504 1.778 m      Head Circumference --       Peak Flow --       Pain Score 07/22/22 1504 0      Pain Loc --       Pain Edu? --       Excl. in GC? --        Physical Exam  Vitals and nursing note reviewed.   Constitutional:       General: He is not in acute distress.     Appearance: Normal appearance. He is normal weight. He is not toxic-appearing.   HENT:      Head: Normocephalic and atraumatic.   Eyes:      Extraocular Movements: Extraocular movements intact.      Conjunctiva/sclera: Conjunctivae normal.   Pulmonary:       Effort: Pulmonary effort is normal. No respiratory distress.   Abdominal:      General: Abdomen is flat.   Musculoskeletal:         General: Normal range of motion.   Skin:     General: Skin is warm and dry.      Comments: See pictures, non-blanching, no mucosal involvement   Neurological:      General: No focal deficit present.      Mental Status: He is alert.   Psychiatric:         Mood and Affect: Mood normal.               MEDICAL DECISION MAKING     PRIMARY PROBLEM LIST     Acute rash   Drug reaction,     DISCUSSION      Medications given in the ED:  ED Medication Orders (From admission, onward)      None            Medications Prescribed: (Note: any 600mg  or 800mg  ibuprofen tablets are prescription dosing, not OTC)  Discharge Prescriptions    None         Clinical Decision Support:       ED Course:        Vital Signs- (reviewed)  Vitals:    07/22/22 1454 07/22/22 1504   BP: 145/84    Pulse: 95    Resp: 16    Temp: 98.1 F (36.7 C)    TempSrc: Oral    SpO2: 98%    Weight:  99.8 kg   Height:  5\' 10"  (1.778 m)       31 y.o. male presents with rash after Depakote increased yesterday.  Possible Depakote rash however given petechial appearance of rash will get CBC CMP and coags. No signs of SJS. No mucosal involvement    Discussed clinical  case, plan of care, and disposition with Cherlyn Roberts, MD, attending physician. Platelets normal, WBC normal other labs normal     Stop depakote FU with provider as scheduled tomorrow   Benadryl as needed for itching   FU derm if worsens     Stable for discharge to home.   Recommended that patient follow up with primary care provider in 3-5 days.     Discussed strict return precautions. All questions answered. Patient expressed understanding.     I do not suspect severe sepsis or septic shock    Vital Signs: Reviewed the patient's vital signs.   Nursing Notes: Reviewed and utilized available nursing notes.  Medical Records Reviewed: Reviewed available past medical  records.  Counseling: The emergency provider has spoken with the patient and discussed today's findings, in addition to providing specific details for the plan of care.  Questions are answered and there is agreement with the plan.      RADIOLOGY IMAGING STUDIES      No orders to display       LABORATORY RESULTS    Ordered and independently interpreted AVAILABLE laboratory tests.   Results       Procedure Component Value Units Date/Time    Comprehensive metabolic panel [[id number]] Collected: 07/22/22 1613    Specimen: Blood Updated: 07/22/22 1640     Glucose 97 mg/dL      BUN 40.9 mg/dL      Creatinine 0.9 mg/dL      Sodium 811 mEq/L      Potassium 3.9 mEq/L      Chloride 106 mEq/L      CO2 26 mEq/L      Calcium 9.5 mg/dL      Protein, Total 6.8 g/dL      Albumin 4.2 g/dL      AST (SGOT) 22 U/L      ALT 36 U/L      Alkaline Phosphatase 84 U/L      Bilirubin, Total 0.3 mg/dL      Globulin 2.6 g/dL      Albumin/Globulin Ratio 1.6     Anion Gap 11.0     eGFR >60.0 mL/min/1.73 m2     PT/APTT [[id number]] Collected: 07/22/22 1613     Updated: 07/22/22 1633     PT 11.1 sec      PT INR 1.0     PTT 34 sec     CBC and differential [[id number]]  (Abnormal) Collected: 07/22/22 1613    Specimen: Blood Updated: 07/22/22 1625     WBC 7.23 x10 3/uL      Hgb 15.3 g/dL      Hematocrit 46.9 %      Platelets 346 x10 3/uL      RBC 5.54 x10 6/uL      MCV 76.2 fL      MCH 27.6 pg      MCHC 36.3 g/dL      RDW 12 %      MPV 8.9 fL      Instrument Absolute Neutrophil Count 2.91 x10 3/uL      Neutrophils 40.2 %      Lymphocytes Automated 46.9 %      Monocytes 9.7 %      Eosinophils Automated 2.1 %      Basophils Automated 0.8 %      Immature Granulocytes 0.3 %      Nucleated RBC 0.0 /100 WBC      Neutrophils Absolute 2.91 x10 3/uL  Lymphocytes Absolute Automated 3.39 x10 3/uL      Monocytes Absolute Automated 0.70 x10 3/uL      Eosinophils Absolute Automated 0.15 x10 3/uL      Basophils Absolute Automated 0.06 x10 3/uL      Immature  Granulocytes Absolute 0.02 x10 3/uL      Absolute NRBC 0.00 x10 3/uL             CRITICAL CARE/PROCEDURES    Procedures    DIAGNOSIS      Diagnosis:  Final diagnoses:   Rash and other nonspecific skin eruption       Disposition:  ED Disposition       ED Disposition   Discharge    Condition   --    Date/Time   Wed Jul 22, 2022  5:15 PM    Comment   Kurt Rogers discharge to home/self care.    Condition at disposition: Stable                 Followup: (See discharge instructions if not listed here)       This note was generated by the Epic EMR system/ Dragon speech recognition and may contain inherent errors or omissions not intended by the user. Grammatical errors, random word insertions, deletions and pronoun errors  are occasional consequences of this technology due to software limitations. Not all errors are caught or corrected. If there are questions or concerns about the content of this note or information contained within the body of this dictation they should be addressed directly with the author for clarification.       Raliegh Scarlet [place], [place]  07/22/22 1839       Cherlyn Roberts, MD  07/22/22 [phone]

## 2022-07-22 NOTE — ED Triage Notes (Addendum)
[facility] EMERGENCY DEPARTMENT  Provider in Triage Note        Patient Name: Thurmon Mizell    Chief Complaint:   Chief Complaint   Patient presents with    Rash       HPI: Lathen Seal is a 31 y.o. male, PMH TIA who has had a rapid medical screening evaluation initiated by myself. Presents for rash that developed on legs and arms after increasing dose of depakote yesterday. He was advised by his psychiatrist to come to the ER if a rash developed. Rash is not pruritic or painful. Denies other lip or throat swelling, shortness of breath.     Medical/Surgical/Social history: as per HPI    Vitals: BP 145/84   Pulse 95   Temp 98.1 F (36.7 C) (Oral)   Resp 16   Ht 5\' 10"  (1.778 m)   Wt 99.8 kg   SpO2 98%   BMI 31.57 kg/m     Pertinent brief exam:   Examination of area of concern: erythematous petechial rash to b/l legs and R upper arm, nonblanchable, no angioedema, oropharynx patent     Preliminary orders: none    Symptom based preliminary diagnosis/MDM: rash, medication s/e    Patient advised to remain in the ED until further evaluation can be performed. Patient instructed to notify staff of any changes in condition while waiting.  This assessment is an initial evaluation to expedite care.

## 2022-08-07 ENCOUNTER — Ambulatory Visit (INDEPENDENT_AMBULATORY_CARE_PROVIDER_SITE_OTHER): Payer: No Typology Code available for payment source | Admitting: Family

## 2022-08-07 ENCOUNTER — Encounter (INDEPENDENT_AMBULATORY_CARE_PROVIDER_SITE_OTHER): Payer: Self-pay | Admitting: Family

## 2022-08-07 VITALS — BP 119/81 | HR 72 | Temp 98.5°F

## 2022-08-07 DIAGNOSIS — R21 Rash and other nonspecific skin eruption: Secondary | ICD-10-CM

## 2022-08-07 NOTE — Progress Notes (Signed)
[facility]  URGENT  CARE  PROGRESS NOTE     Patient: Kurt Rogers   Date: 08/07/2022   MRN: [id number]       Kurt Rogers is a 31 y.o. male      HISTORY     History obtained from: Patient    Chief Complaint   Patient presents with    Rash     A month- Rash on both legs on and off, redness  No itchiness/pain           Rash     31 year old male presents to urgent clinic with concerns for rash to both of his legs for the last month.  Rashes on and off and would come and go away on its own.  No leaving aggravating factors.  States he was actually seen in the emergency department early September for similar rash.  States he had blood work done which was normal and they thought that it was possibly from Depakote that he was started on a week prior to his ER visit.  Has discontinued Depakote and rash still has been coming and going still.  Rash is neither itchy or painful.  Denies any shortness of breath, typically speaking/swallowing, new foods, skin products, medications, numbness, tingling, or extremity weakness.    Review of Systems   Skin:  Positive for rash.    as above    History:    Pertinent Past Medical, Surgical, Family and Social History were reviewed.        Current Outpatient Medications:     amphetamine-dextroamphetamine (ADDERALL XR) 20 MG 24 hr capsule, Take 10 mg by mouth 2 (two) times daily, Disp: , Rfl:     aspirin EC 81 MG EC tablet, Take 1 tablet (81 mg) by mouth daily, Disp: , Rfl:     LITHIUM PO, Take by mouth, Disp: , Rfl:     QUEtiapine (SEROquel) 100 MG tablet, Take 1 tablet (100 mg) by mouth nightly, Disp: , Rfl:     divalproex, ER, extended release (DEPAKOTE ER) 500 MG 24 hr tablet, Take 1 tablet (500 mg) by mouth daily (Patient not taking: Reported on 08/07/2022), Disp: , Rfl:     FLUoxetine (PROzac) 20 MG capsule, Take 1 capsule (20 mg) by mouth daily (Patient not taking: Reported on 08/07/2022), Disp: , Rfl:     hydrOXYzine (ATARAX) 25 MG tablet, Take 1 tablet (25 mg) by mouth  every 8 (eight) hours as needed for Itching, Disp: 20 tablet, Rfl: 0    lamoTRIgine (LaMICtal) 25 MG tablet, TAKE 1 TABLET BY MOUTH BEFORE BEDTIME ON 05/15/22 TAKE 2 TABLETS BY MOUTH DAILY BEFORE BEDTIME (Patient not taking: Reported on 05/21/2022), Disp: , Rfl:     ondansetron (ZOFRAN-ODT) 4 MG disintegrating tablet, Take 1 tablet (4 mg) by mouth every 6 (six) hours as needed for Nausea (Patient not taking: Reported on 04/06/2022), Disp: 8 tablet, Rfl: 0    Allergies   Allergen Reactions    Latex     Shellfish-Derived Products Angioedema       Medications and Allergies reviewed.    PHYSICAL EXAM     Vitals:    08/07/22 0948   BP: 119/81   BP Site: Right arm   Patient Position: Sitting   Pulse: 72   Temp: 98.5 F (36.9 C)   TempSrc: Tympanic   SpO2: 95%       Physical Exam  Constitutional:       General: He is not in acute  distress.     Appearance: Normal appearance. He is well-developed.   HENT:      Head: Normocephalic and atraumatic.      Nose: Nose normal.      Mouth/Throat:      Mouth: Mucous membranes are moist.      Pharynx: No oropharyngeal exudate.     Eyes: Conjunctivae and EOM are normal. Pupils are equal, round, and reactive to light. No scleral icterus. Neck:      Thyroid: No thyroid mass or thyromegaly.      Vascular: No carotid bruit.   Cardiovascular:      Rate and Rhythm: Normal rate and regular rhythm.      Pulses: Normal pulses.      Heart sounds: Normal heart sounds. No murmur heard.  Pulmonary:      Effort: Pulmonary effort is normal. No respiratory distress.      Breath sounds: Normal breath sounds. No wheezing.   Abdominal:      General: Bowel sounds are normal. There is no distension.      Palpations: Abdomen is soft. There is no mass.      Tenderness: There is no abdominal tenderness.   Musculoskeletal:         General: Normal range of motion.      Cervical back: Normal range of motion and neck supple.   Lymphadenopathy:      Cervical: No cervical adenopathy.   Neurological:      Mental  Status: He is alert and oriented to person, place, and time.      Cranial Nerves: No cranial nerve deficit.      Sensory: No sensory deficit.      Gait: Gait normal.      Deep Tendon Reflexes: Reflexes are normal and symmetric.   Skin:     General: Skin is warm and dry.      Findings: Rash present. Rash is macular.      Comments: Scattered slightly erythematous maculopapular patches noted on patient's bilateral lower extremities.  Blanches on palpation.  Nonvesicular, not purpuric, not petechial.  Negative for central clearing.   Psychiatric:         Mood and Affect: Mood normal.         Speech: Speech normal.         Behavior: Behavior normal.   Vitals and nursing note reviewed.          UCC COURSE     There were no labs reviewed with this patient during the visit.    There were no x-rays reviewed with this patient during the visit.    No current facility-administered medications for this visit.       PROCEDURES     Procedures    MEDICAL DECISION MAKING     History, physical, labs/studies most consistent with acute rash as the diagnosis.    Chart Review:  Prior PCP, Specialist and/or ED notes reviewed today: Yes  Prior labs/images/studies reviewed today: Yes    Differential Diagnosis: Acute rash/urticaria, contact dermatitis, poison ivy dermatitis, tinea infection, cellulitis, skin abscess, anaphylaxis, environmental/drug/food allergy, shingles, insect bite        ASSESSMENT     Encounter Diagnosis   Name Primary?    Rash and other nonspecific skin eruption Yes                PLAN      PLAN: VSS, afebrile, nontachy, appears to be in no acute distress, lung sounds clear, abdomen soft  and nontender palpation.  TM's pearly gray, oropharynx nonerythematous and not exudative.  Is nontoxic-appearing.    Scattered patches noted on patient's bilateral lower extremities.  Patient states rash come and go and concern for cellulitis, tinea, eczema is low.  Advised patient use of Zyrtec p.o. and strongly encouraged follow-up  with dermatology.    Strict ER precautions discussed.  Advised follow up with dermatology/PCP.  Indicated understanding and agreed to treatment plan.              Orders Placed This Encounter   Procedures    Ambulatory referral to Dermatology     Requested Prescriptions      No prescriptions requested or ordered in this encounter       Discussed results and diagnosis with patient/family.  Reviewed warning signs for worsening condition, as well as, indications for follow-up with primary care physician and return to urgent care clinic.   Patient/family expressed understanding of instructions.     An After Visit Summary was provided to the patient.

## 2022-08-07 NOTE — Patient Instructions (Addendum)
Please take over-the-counter Zyrtec as instructed.    Please return or go to the ER for any new or worsening symptoms that concern you.  Follow-up with dermatology or family doctor as soon as possible.

## 2022-09-15 ENCOUNTER — Ambulatory Visit (INDEPENDENT_AMBULATORY_CARE_PROVIDER_SITE_OTHER): Payer: No Typology Code available for payment source | Admitting: Family

## 2022-09-15 ENCOUNTER — Encounter (INDEPENDENT_AMBULATORY_CARE_PROVIDER_SITE_OTHER): Payer: Self-pay | Admitting: Family

## 2022-09-15 VITALS — BP 133/84 | HR 76 | Temp 99.0°F

## 2022-09-15 DIAGNOSIS — R21 Rash and other nonspecific skin eruption: Secondary | ICD-10-CM

## 2022-09-15 NOTE — Progress Notes (Signed)
[facility]  PROGRESS NOTE     Patient: Kurt Rogers   Date: 09/15/2022   MRN: [id number]       Kurt Rogers is a 31 y.o. male with no PMH here today for rash on his palm that started 3 days ago.  Patient was on a cruise and his symptoms started 4 days ago.  Initially had a fever and then the rash started after.  Patient fever has broke without any fever reducing medicine.  Has noticed that his rash on his palms broke and had a fluid that came out of it.  Patient has mild fatigue.  Rash is not itchy and slightly painful. Denies any cough, congestion, chest pain, palpitation shortness of breath.  No new medication or any change in product in past couple of months.  Denies any joint pain or urinary symptoms.    HISTORY     History obtained from: Patient    Chief Complaint   Patient presents with    Hand Injury     Pt states sores on both hands, pain, green pus          Hand Injury          Review of SystemsAll other negative except pertinent positive in HPI    History:    Pertinent Past Medical, Surgical, Family and Social History were reviewed.        Current Outpatient Medications:     amphetamine-dextroamphetamine (ADDERALL XR) 20 MG 24 hr capsule, Take 10 mg by mouth 2 (two) times daily, Disp: , Rfl:     LITHIUM PO, Take by mouth, Disp: , Rfl:     QUEtiapine (SEROquel) 100 MG tablet, Take 1 tablet (100 mg) by mouth nightly, Disp: , Rfl:     aspirin EC 81 MG EC tablet, Take 1 tablet (81 mg) by mouth daily (Patient not taking: Reported on 09/15/2022), Disp: , Rfl:     divalproex, ER, extended release (DEPAKOTE ER) 500 MG 24 hr tablet, Take 1 tablet (500 mg) by mouth daily (Patient not taking: Reported on 08/07/2022), Disp: , Rfl:     FLUoxetine (PROzac) 20 MG capsule, Take 1 capsule (20 mg) by mouth daily (Patient not taking: Reported on 08/07/2022), Disp: , Rfl:     hydrOXYzine (ATARAX) 25 MG tablet, Take 1 tablet (25 mg) by mouth every 8 (eight) hours as needed for Itching, Disp: 20 tablet,  Rfl: 0    lamoTRIgine (LaMICtal) 25 MG tablet, TAKE 1 TABLET BY MOUTH BEFORE BEDTIME ON 05/15/22 TAKE 2 TABLETS BY MOUTH DAILY BEFORE BEDTIME (Patient not taking: Reported on 05/21/2022), Disp: , Rfl:     ondansetron (ZOFRAN-ODT) 4 MG disintegrating tablet, Take 1 tablet (4 mg) by mouth every 6 (six) hours as needed for Nausea (Patient not taking: Reported on 04/06/2022), Disp: 8 tablet, Rfl: 0    Allergies   Allergen Reactions    Latex     Shellfish-Derived Products Angioedema       Medications and Allergies reviewed.    PHYSICAL EXAM     Vitals:    09/15/22 1547   BP: 133/84   BP Site: Right arm   Patient Position: Sitting   Pulse: 76   Temp: 99 F (37.2 C)   TempSrc: Tympanic   SpO2: 96%       Physical Exam  Constitutional:       General: He is not in acute distress.     Appearance: Normal appearance. He is well-developed. He  is not ill-appearing, toxic-appearing or diaphoretic.   HENT:      Head: Normocephalic and atraumatic.      Right Ear: Tympanic membrane and external ear normal.      Left Ear: Tympanic membrane and external ear normal.      Nose: Nose normal. No congestion or rhinorrhea.      Mouth/Throat:      Mouth: Mucous membranes are moist.      Pharynx: No oropharyngeal exudate or posterior oropharyngeal erythema.     Eyes: Conjunctivae are normal. Right conjunctiva is not injected. Left conjunctiva is not injected. No scleral icterus. Cardiovascular:      Rate and Rhythm: Normal rate and regular rhythm.      Heart sounds: Normal heart sounds. No murmur heard.  Pulmonary:      Effort: Pulmonary effort is normal. No respiratory distress.      Breath sounds: Normal breath sounds. No wheezing.   Musculoskeletal:      Cervical back: Normal range of motion and neck supple.   Lymphadenopathy:      Cervical: No cervical adenopathy.   Neurological:      Mental Status: He is alert and oriented to person, place, and time.   Skin:     General: Skin is warm and dry.      Findings: Rash present.      Comments: Few  maculopapular and vesicular rash on the palm of bilateral hands.  There is no surrounding erythema, edema, streaking, crepitus or induration.  Range of motion of the fingers normal.  Cap refill less than 2 seconds and compartment soft and normal.   Psychiatric:         Behavior: Behavior normal.   Vitals and nursing note reviewed.         UCC COURSE     There were no labs reviewed with this patient during the visit.    There were no x-rays reviewed with this patient during the visit.    No current facility-administered medications for this visit.       PROCEDURES     Procedures    MEDICAL DECISION MAKING     History, physical, labs/studies most consistent with rash as the diagnosis.    Chart Review:  Prior PCP, Specialist and/or ED notes reviewed today: No  Prior labs/images/studies reviewed today: No    Differential Diagnosis: Necrotizing  fascitis , vasculitis, TENS/SJS, anaphylaxis, contact Dermatitis, Insect Bite, Allergic Drug Reaction, Abscess, Stasis Dermatitis, Shingles, Erythema Migrans, Gout.        ASSESSMENT     Encounter Diagnosis   Name Primary?    Rash and nonspecific skin eruption Yes            PLAN      There is likely hand-foot-and-mouth  Patient appears well, afebrile and not in acute distress  Discussed symptomatic treatment and contagiousness nature of hand-foot-and-mouth  Keep the area clean and dry   Follow-up with the PCP in 2 to 3 days  Discussed results and diagnosis with patient/family.  Discussed diagnostic uncertainty, possibility of progression of illness, and warning signs for worsening condition. Discussed indications for return to urgent care clinic, follow up instructions, and ER precautions extensively. Counseled on medication use and home/self care. Advised to follow-up with their primary care provider as well as with any specialists as necessary. All questions form patient/family answered and they verbally acknowledged understanding of these instructions without any apparent  barriers to learning.       No  orders of the defined types were placed in this encounter.    Requested Prescriptions      No prescriptions requested or ordered in this encounter           An After Visit Summary was provided to the patient.

## 2022-10-16 ENCOUNTER — Ambulatory Visit (INDEPENDENT_AMBULATORY_CARE_PROVIDER_SITE_OTHER): Payer: No Typology Code available for payment source | Admitting: Family

## 2022-10-16 ENCOUNTER — Encounter (INDEPENDENT_AMBULATORY_CARE_PROVIDER_SITE_OTHER): Payer: Self-pay | Admitting: Family

## 2022-10-16 VITALS — BP 127/85 | HR 80 | Temp 98.9°F

## 2022-10-16 DIAGNOSIS — R059 Cough, unspecified: Secondary | ICD-10-CM

## 2022-10-16 DIAGNOSIS — U071 COVID-19: Secondary | ICD-10-CM

## 2022-10-16 LAB — GOHEALTH POC COVID QUICKVUE ANTIGEN: QuickVue SARS COV2 Antigen POCT: POSITIVE — AB

## 2022-10-16 NOTE — Patient Instructions (Signed)
Your rapid COVID test was positive today. Your strep test and flu test was negative in the clinic today. You can take over-the-counter Flonase, Claritin, Sudafed, and Tylenol or Motrin as needed.  Rest, home, fluids.    Please return or go to the ER for any new or worsening symptoms that concern you.  Follow-up with your family doctor in 3 to 5 days.

## 2022-10-16 NOTE — Progress Notes (Signed)
[facility]  URGENT  CARE  PROGRESS NOTE     Patient: Kurt Rogers   Date: 10/16/2022   MRN: [id number]       Thorn Demas is a 31 y.o. male      HISTORY     History obtained from: Patient    Chief Complaint   Patient presents with    Otalgia     This morning- Pt states Left ear pain, sputum, congestion  No fever          Otalgia      31 year old male history of mitral valve disease presents urgent care clinic with concerns for nasal congestion, left-sided ear ache, cough onset today.  Denies any chest pain, shortness of breath, body aches, COVID fevers, nausea, vomit, diarrhea, abdominal pain, sore throat, dizziness, or syncope.    Review of Systems   HENT:  Positive for ear pain.     as above    History:    Pertinent Past Medical, Surgical, Family and Social History were reviewed.        Current Outpatient Medications:     amphetamine-dextroamphetamine (ADDERALL XR) 20 MG 24 hr capsule, Take 10 mg by mouth 2 (two) times daily, Disp: , Rfl:     LITHIUM PO, Take by mouth, Disp: , Rfl:     QUEtiapine (SEROquel) 100 MG tablet, Take 25 mg by mouth 2 (two) times daily, Disp: , Rfl:     aspirin EC 81 MG EC tablet, Take 1 tablet (81 mg) by mouth daily (Patient not taking: Reported on 09/15/2022), Disp: , Rfl:     divalproex, ER, extended release (DEPAKOTE ER) 500 MG 24 hr tablet, Take 1 tablet (500 mg) by mouth daily (Patient not taking: Reported on 08/07/2022), Disp: , Rfl:     FLUoxetine (PROzac) 20 MG capsule, Take 1 capsule (20 mg) by mouth daily (Patient not taking: Reported on 08/07/2022), Disp: , Rfl:     hydrOXYzine (ATARAX) 25 MG tablet, Take 1 tablet (25 mg) by mouth every 8 (eight) hours as needed for Itching, Disp: 20 tablet, Rfl: 0    lamoTRIgine (LaMICtal) 25 MG tablet, TAKE 1 TABLET BY MOUTH BEFORE BEDTIME ON 05/15/22 TAKE 2 TABLETS BY MOUTH DAILY BEFORE BEDTIME (Patient not taking: Reported on 05/21/2022), Disp: , Rfl:     ondansetron (ZOFRAN-ODT) 4 MG disintegrating tablet, Take 1 tablet (4 mg) by  mouth every 6 (six) hours as needed for Nausea (Patient not taking: Reported on 04/06/2022), Disp: 8 tablet, Rfl: 0    Allergies   Allergen Reactions    Latex     Shellfish-Derived Products Angioedema       Medications and Allergies reviewed.    PHYSICAL EXAM     Vitals:    10/16/22 1824   BP: 127/85   BP Site: Right arm   Patient Position: Sitting   Pulse: 80   Temp: 98.9 F (37.2 C)   TempSrc: Tympanic   SpO2: 98%       Physical Exam  Constitutional:       General: He is not in acute distress.     Appearance: Normal appearance. He is well-developed.   HENT:      Head: Normocephalic and atraumatic.      Right Ear: Hearing and tympanic membrane normal.      Left Ear: Hearing and tympanic membrane normal.      Nose: Congestion present.      Mouth/Throat:      Mouth: Mucous membranes are  moist.      Pharynx: No oropharyngeal exudate or posterior oropharyngeal erythema.      Tonsils: No tonsillar exudate.     Eyes: Conjunctivae and EOM are normal. Pupils are equal, round, and reactive to light. No scleral icterus. Neck:      Thyroid: No thyroid mass or thyromegaly.      Vascular: No carotid bruit.   Cardiovascular:      Rate and Rhythm: Normal rate and regular rhythm.      Pulses: Normal pulses.      Heart sounds: Normal heart sounds. No murmur heard.  Pulmonary:      Effort: Pulmonary effort is normal. No respiratory distress.      Breath sounds: Normal breath sounds. No wheezing.   Abdominal:      General: Bowel sounds are normal. There is no distension.      Palpations: Abdomen is soft. There is no mass.      Tenderness: There is no abdominal tenderness.   Musculoskeletal:         General: Normal range of motion.      Cervical back: Normal range of motion and neck supple.   Lymphadenopathy:      Cervical: No cervical adenopathy.   Neurological:      Mental Status: He is alert and oriented to person, place, and time.      Cranial Nerves: No cranial nerve deficit.      Sensory: No sensory deficit.      Gait: Gait  normal.      Deep Tendon Reflexes: Reflexes are normal and symmetric.   Skin:     General: Skin is warm and dry.      Findings: No rash.   Psychiatric:         Mood and Affect: Mood normal.         Speech: Speech normal.         Behavior: Behavior normal.   Vitals and nursing note reviewed.         UCC COURSE     LABS  The following POCT tests were ordered, reviewed and discussed with the patient/family.     Results       Procedure Component Value Units Date/Time    QuickVue SARS-COV-2 Antigen POCT [[id number]]  (Abnormal) Collected: 10/16/22 1839    Specimen: Nasal Swab COVID-19 from [facility] Updated: 10/16/22 1839     QuickVue SARS COV2 Antigen POCT Positive            There were no x-rays reviewed with this patient during the visit.    No current facility-administered medications for this visit.       PROCEDURES     Procedures    MEDICAL DECISION MAKING     History, physical, labs/studies most consistent with acute COVID 19 as the diagnosis.    Chart Review:  Prior PCP, Specialist and/or ED notes reviewed today: No  Prior labs/images/studies reviewed today: No    Differential Diagnosis: viral URI, allergic rhinitis, strep throat, COVID-19, sinus infection, mono, viral pharyngitis,OM/OE, pneumonia        ASSESSMENT     Encounter Diagnoses   Name Primary?    Cough, unspecified type     COVID-19 Yes                PLAN      PLAN: VSS, afebrile, nontachy, appears to be in no acute distress, lung sounds clear, abdomen soft and nontender palpation.  TM's pearly gray, oropharynx nonerythematous  and not exudative.  Is nontoxic-appearing.    Concern for otitis media/externa is low.  Clinical concern for pneumonia is low.    Patient positive for COVID-19 in clinic.  Risks and benefits of Paxlovid discussed with patient and patient opting to just do symptomatic treatment with antihistamines and nasal decongestants. Educated on [employer] COVID 19 isolation guidelines.       Strict ER precautions discussed.  Advised follow up with PCP.   Indicated understanding and agreed to treatment plan.              Orders Placed This Encounter   Procedures    QuickVue SARS-COV-2 Antigen POCT     Requested Prescriptions      No prescriptions requested or ordered in this encounter       Discussed results and diagnosis with patient/family.  Reviewed warning signs for worsening condition, as well as, indications for follow-up with primary care physician and return to urgent care clinic.   Patient/family expressed understanding of instructions.     An After Visit Summary was provided to the patient.

## 2022-10-20 ENCOUNTER — Ambulatory Visit (INDEPENDENT_AMBULATORY_CARE_PROVIDER_SITE_OTHER): Payer: No Typology Code available for payment source | Admitting: Nurse Practitioner

## 2022-10-20 VITALS — BP 131/87 | HR 84 | Temp 98.3°F

## 2022-10-20 DIAGNOSIS — R058 Other specified cough: Secondary | ICD-10-CM

## 2022-10-20 DIAGNOSIS — Z8701 Personal history of pneumonia (recurrent): Secondary | ICD-10-CM

## 2022-10-20 MED ORDER — GUAIFENESIN ER 600 MG PO TB12
600.0000 mg | ORAL_TABLET | Freq: Two times a day (BID) | ORAL | 0 refills | Status: AC
Start: 2022-10-20 — End: 2022-10-27

## 2022-10-20 MED ORDER — ALBUTEROL SULFATE HFA 108 (90 BASE) MCG/ACT IN AERS
2.0000 | INHALATION_SPRAY | Freq: Four times a day (QID) | RESPIRATORY_TRACT | 0 refills | Status: DC | PRN
Start: 2022-10-20 — End: 2023-02-19

## 2022-10-20 MED ORDER — ALBUTEROL-IPRATROPIUM 2.5-0.5 (3) MG/3ML IN SOLN
3.0000 mL | Freq: Once | RESPIRATORY_TRACT | Status: AC
Start: 2022-10-20 — End: 2022-10-20
  Administered 2022-10-20: 3 mL via RESPIRATORY_TRACT

## 2022-10-20 MED ORDER — BENZONATATE 100 MG PO CAPS
100.0000 mg | ORAL_CAPSULE | Freq: Three times a day (TID) | ORAL | 0 refills | Status: AC | PRN
Start: 2022-10-20 — End: 2022-10-23

## 2022-10-20 NOTE — Patient Instructions (Signed)
Please get chest x-ray. Rest and drink hot tea with lemon/honey. Follow up with the PCP in 1 week. If having chest pain, shortness of breath, dizziness or lethargy to go to the ER.

## 2022-10-20 NOTE — Progress Notes (Incomplete)
Urgent Care Provider Note    Patient: Kurt Rogers   Date: 10/20/2022   MRN: [id number]       Subjective     Chief Complaint   Patient presents with   . Chest congestion     December 1st- Pt states chest congestion, fever   Sudafed, nyquil with very mild relief        HPI:  HPI    Kurt Rogers is a 31 y.o. male with c/o upper respiratory symptoms that are moderate which started 3 to 5 days and has been gradually worsening. Diagnosis with covid on 10/16/22. Has take sudafed and nyquil with little relief. Patient admits to cough and congestion. Coughing worsening with green mucous/streak of blood.  Patient denies sore throat, ear ache, fever, myalgia, chills, vomiting, diarrhea, SOB, chest pain, and rash.    Pertinent Past Medical, Surgical, Family and Social History were reviewed.      Current Outpatient Medications:   .  amphetamine-dextroamphetamine (ADDERALL XR) 20 MG 24 hr capsule, Take 10 mg by mouth 2 (two) times daily, Disp: , Rfl:   .  LITHIUM PO, Take by mouth, Disp: , Rfl:   .  QUEtiapine (SEROquel) 100 MG tablet, Take 25 mg by mouth 2 (two) times daily, Disp: , Rfl:   .  albuterol sulfate HFA (ProAir HFA) 108 (90 Base) MCG/ACT inhaler, Inhale 2 puffs into the lungs every 6 (six) hours as needed for Wheezing or Shortness of Breath (cough), Disp: 1 each, Rfl: 0  .  aspirin EC 81 MG EC tablet, Take 1 tablet (81 mg) by mouth daily (Patient not taking: Reported on 09/15/2022), Disp: , Rfl:   .  benzonatate (TESSALON) 100 MG capsule, Take 1 capsule (100 mg) by mouth 3 (three) times daily as needed for Cough, Disp: 9 capsule, Rfl: 0  .  divalproex, ER, extended release (DEPAKOTE ER) 500 MG 24 hr tablet, Take 1 tablet (500 mg) by mouth daily (Patient not taking: Reported on 08/07/2022), Disp: , Rfl:   .  FLUoxetine (PROzac) 20 MG capsule, Take 1 capsule (20 mg) by mouth daily (Patient not taking: Reported on 08/07/2022), Disp: , Rfl:   .  guaiFENesin (MUCINEX) 600 MG 12 hr tablet, Take 1 tablet (600  mg) by mouth 2 (two) times daily for 7 days, Disp: 14 tablet, Rfl: 0  .  hydrOXYzine (ATARAX) 25 MG tablet, Take 1 tablet (25 mg) by mouth every 8 (eight) hours as needed for Itching, Disp: 20 tablet, Rfl: 0  .  lamoTRIgine (LaMICtal) 25 MG tablet, TAKE 1 TABLET BY MOUTH BEFORE BEDTIME ON 05/15/22 TAKE 2 TABLETS BY MOUTH DAILY BEFORE BEDTIME (Patient not taking: Reported on 05/21/2022), Disp: , Rfl:   .  ondansetron (ZOFRAN-ODT) 4 MG disintegrating tablet, Take 1 tablet (4 mg) by mouth every 6 (six) hours as needed for Nausea (Patient not taking: Reported on 04/06/2022), Disp: 8 tablet, Rfl: 0  No current facility-administered medications for this visit.    Allergies   Allergen Reactions   . Latex    . Shellfish-Derived Products Angioedema       Medications and Allergies reviewed.         Objective     Vitals:    10/20/22 1745   BP: 131/87   Pulse: 84   Temp: 98.3 F (36.8 C)   SpO2: 97%     There is no height or weight on file to calculate BMI.    Physical Exam    General:  well developed, well nourished, no acute distress.     Eyes: No conjunctival injection or discharge.    Ear: TM without erythema, bulging or perforation; normal auditory canals and pinnae; no mastoid tenderness.    Nose/sinus: no nasal congestion; no edema or erythema overlying the sinuses, no sinus tenderness.    Throat: no posterior oropharynx erythema, swelling, exudates or peritonsillar bulging; normal, midline uvula; no trismus or drooling.    Neck: No stridor. Normal ROM.    Lung: normal work of breathing, speaking complete sentences, no rales, wheezing or rhonchi.    Heart: Regular rhythm, no murmurs.    UCC COURSE  X-ray ordered and images reviewed independently by me.   Number of image views reviewed: 2  Preliminary reading:   Results of x-ray discussed with the patient/family.   Radiologist reading also reviewed and copy of report provided to patient.     Reason for Tx: cough  Pre- neb SATS:97  Post neb SATS: 97  Patient disposition:  improved cough       PROCEDURES:  Procedures        Assessment     Imaging Orders Placed this Encounter   Procedures   . XR Chest 2 Views         Kurt Rogers was seen today for chest congestion.    Diagnoses and all orders for this visit:    History of pneumonia  -     XR Chest 2 Views; Future    Productive cough  -     albuterol-ipratropium (DUO-NEB) 2.5-0.5(3) mg/3 mL nebulizer 3 mL  -     PR PRESSURIZED/NONPRESSURIZED INHALATION TREATMENT  -     XR Chest 2 Views; Future    Other orders  -     albuterol sulfate HFA (ProAir HFA) 108 (90 Base) MCG/ACT inhaler; Inhale 2 puffs into the lungs every 6 (six) hours as needed for Wheezing or Shortness of Breath (cough)  -     guaiFENesin (MUCINEX) 600 MG 12 hr tablet; Take 1 tablet (600 mg) by mouth 2 (two) times daily for 7 days  -     benzonatate (TESSALON) 100 MG capsule; Take 1 capsule (100 mg) by mouth 3 (three) times daily as needed for Cough        Plan and follow-up discussed with patient. See AVS for further documentation.

## 2022-11-11 ENCOUNTER — Ambulatory Visit (INDEPENDENT_AMBULATORY_CARE_PROVIDER_SITE_OTHER): Payer: No Typology Code available for payment source | Admitting: Family

## 2022-11-11 ENCOUNTER — Encounter (INDEPENDENT_AMBULATORY_CARE_PROVIDER_SITE_OTHER): Payer: Self-pay | Admitting: Family

## 2022-11-11 VITALS — BP 131/79 | HR 96 | Temp 99.2°F

## 2022-11-11 DIAGNOSIS — R238 Other skin changes: Secondary | ICD-10-CM

## 2022-11-11 NOTE — Progress Notes (Signed)
[facility]  PROGRESS NOTE     Patient: Kurt Rogers   Date: 11/11/2022   MRN: [id number]       Ocean Schildt is a 31 y.o. male      HISTORY     History obtained from: Patient    Chief Complaint   Patient presents with    Hair/Scalp Problem     A week- Pt states spot on scalp, pain        Patient presents to the clinic with papule on top of her head,            Review of Systems   All other systems reviewed and are negative.      History:    Pertinent Past Medical, Surgical, Family and Social History were reviewed.        Current Outpatient Medications:     amphetamine-dextroamphetamine (ADDERALL XR) 20 MG 24 hr capsule, Take 10 mg by mouth 2 (two) times daily, Disp: , Rfl:     LITHIUM PO, Take by mouth, Disp: , Rfl:     QUEtiapine (SEROquel) 100 MG tablet, Take 25 mg by mouth 2 (two) times daily, Disp: , Rfl:     albuterol sulfate HFA (ProAir HFA) 108 (90 Base) MCG/ACT inhaler, Inhale 2 puffs into the lungs every 6 (six) hours as needed for Wheezing or Shortness of Breath (cough), Disp: 1 each, Rfl: 0    aspirin EC 81 MG EC tablet, Take 1 tablet (81 mg) by mouth daily (Patient not taking: Reported on 09/15/2022), Disp: , Rfl:     divalproex, ER, extended release (DEPAKOTE ER) 500 MG 24 hr tablet, Take 1 tablet (500 mg) by mouth daily (Patient not taking: Reported on 08/07/2022), Disp: , Rfl:     FLUoxetine (PROzac) 20 MG capsule, Take 1 capsule (20 mg) by mouth daily (Patient not taking: Reported on 08/07/2022), Disp: , Rfl:     hydrOXYzine (ATARAX) 25 MG tablet, Take 1 tablet (25 mg) by mouth every 8 (eight) hours as needed for Itching, Disp: 20 tablet, Rfl: 0    lamoTRIgine (LaMICtal) 25 MG tablet, TAKE 1 TABLET BY MOUTH BEFORE BEDTIME ON 05/15/22 TAKE 2 TABLETS BY MOUTH DAILY BEFORE BEDTIME (Patient not taking: Reported on 05/21/2022), Disp: , Rfl:     ondansetron (ZOFRAN-ODT) 4 MG disintegrating tablet, Take 1 tablet (4 mg) by mouth every 6 (six) hours as needed for Nausea (Patient not taking:  Reported on 04/06/2022), Disp: 8 tablet, Rfl: 0    Allergies   Allergen Reactions    Latex     Shellfish-Derived Products Angioedema       Medications and Allergies reviewed.    PHYSICAL EXAM     Vitals:    11/11/22 1831   BP: 131/79   BP Site: Right arm   Patient Position: Sitting   Pulse: 96   Temp: 99.2 F (37.3 C)   TempSrc: Tympanic   SpO2: 98%       Physical Exam  HENT:      Head:     Neurological:      Mental Status: He is alert and oriented to person, place, and time.         UCC COURSE     There were no labs reviewed with this patient during the visit.    There were no x-rays reviewed with this patient during the visit.    No current facility-administered medications for this visit.       PROCEDURES  Procedures    MEDICAL DECISION MAKING     History, physical, labs/studies most consistent with Papule as the diagnosis.    Chart Review:  Prior PCP, Specialist and/or ED notes reviewed today: No  Prior labs/images/studies reviewed today: No    Differential Diagnosis: Papule vs Cyst       ASSESSMENT     Encounter Diagnosis   Name Primary?    Papule Yes            PLAN      PLAN:     Wound care provided to the area     Advised to f/o here or PCP for new or worsening symptoms       No orders of the defined types were placed in this encounter.    Requested Prescriptions      No prescriptions requested or ordered in this encounter       Discussed results and diagnosis with patient/family.  Reviewed warning signs for worsening condition, as well as, indications for follow-up with primary care physician and return to urgent care clinic.   Patient/family expressed understanding of instructions.     An After Visit Summary was provided to the patient.

## 2023-01-29 ENCOUNTER — Encounter (INDEPENDENT_AMBULATORY_CARE_PROVIDER_SITE_OTHER): Payer: Self-pay

## 2023-01-29 ENCOUNTER — Ambulatory Visit (INDEPENDENT_AMBULATORY_CARE_PROVIDER_SITE_OTHER): Payer: No Typology Code available for payment source | Admitting: Physician Assistant

## 2023-01-29 VITALS — BP 128/85 | HR 91 | Temp 97.8°F | Resp 16

## 2023-01-29 DIAGNOSIS — M6289 Other specified disorders of muscle: Secondary | ICD-10-CM

## 2023-01-29 NOTE — Progress Notes (Deleted)
Pharmacy: [facility] [place], [place] [address]   [place]          Date of Exam: January 29, 2023             Patient ID: [id number] : Kurt Rogers is a 32 y.o. male.    Your Nurse Today: Edgardo Roys, MBA, [facility], [place], [place]          Chief Complaint:    Chief Complaint   Patient presents with    Hernia     Sx x   other sx:          Vital Signs:    There were no vitals taken for this visit.           Current Meds:    No outpatient medications have been marked as taking for the 01/29/23 encounter (Office Visit) with [facility].          Allergies:    Allergies   Allergen Reactions    Latex     Shellfish-Derived Products Angioedema               Problem List:    Patient Active Problem List   Diagnosis    Left-sided weakness    Mitral valve disease         Past Surgical History:    Past Surgical History:   Procedure Laterality Date    TONSILLECTOMY             Family History:    No family history on file.        Social History:    Social History     Tobacco Use    Smoking status: Never     Passive exposure: Never    Smokeless tobacco: Never   Vaping Use    Vaping Use: Never used   Substance Use Topics    Alcohol use: Yes     Comment: socially    Drug use: No     Comment: marijuana 1-2x a month (edibles)         Today's POC Testing Results:    There were no labs reviewed with this patient during the visit.           Today's In - Office Medications:          Today's Vaccine(s) in Clinic:          Today's Tasks Completed in Clinic:          MyChart Access:          Edgardo Roys, MBA, [facility], [place], [place]

## 2023-01-29 NOTE — Patient Instructions (Signed)
I've attached two referrals.   Ortho and General surgery.     I'd try ortho first.     [email or web address] is a Microbiologist.

## 2023-01-29 NOTE — Progress Notes (Unsigned)
[facility]  CARE  PROGRESS NOTE     Patient: Kurt Rogers   Date: 01/29/2023   MRN: [id number]       Rene Lardy is a 32 y.o. male      HISTORY     History obtained from: Patient    Chief Complaint   Patient presents with    Hernia     Sx x   other sx:     Leg Injury     Onset - hernia on front left leg, been issue for years, enlarged recently        HPI medical history significant for a hernia of his lower leg presents with a worsening of the hernia.  Today he felt a popping tearing sensation, notes that the lump has grown.  Notes some numbness and tingling distal to the hernia at this time.    Review of Systems ROS negative unless noted in hpi     History:    Pertinent Past Medical, Surgical, Family and Social History were reviewed.        Current Outpatient Medications:     amphetamine-dextroamphetamine (ADDERALL XR) 20 MG 24 hr capsule, Take 10 mg by mouth 2 (two) times daily, Disp: , Rfl:     LITHIUM PO, Take by mouth, Disp: , Rfl:     QUEtiapine (SEROquel) 100 MG tablet, Take 25 mg by mouth 2 (two) times daily, Disp: , Rfl:     albuterol sulfate HFA (ProAir HFA) 108 (90 Base) MCG/ACT inhaler, Inhale 2 puffs into the lungs every 6 (six) hours as needed for Wheezing or Shortness of Breath (cough), Disp: 1 each, Rfl: 0    aspirin EC 81 MG EC tablet, Take 1 tablet (81 mg) by mouth daily (Patient not taking: Reported on 09/15/2022), Disp: , Rfl:     divalproex, ER, extended release (DEPAKOTE ER) 500 MG 24 hr tablet, Take 1 tablet (500 mg) by mouth daily (Patient not taking: Reported on 08/07/2022), Disp: , Rfl:     FLUoxetine (PROzac) 20 MG capsule, Take 1 capsule (20 mg) by mouth daily (Patient not taking: Reported on 08/07/2022), Disp: , Rfl:     hydrOXYzine (ATARAX) 25 MG tablet, Take 1 tablet (25 mg) by mouth every 8 (eight) hours as needed for Itching, Disp: 20 tablet, Rfl: 0    lamoTRIgine (LaMICtal) 25 MG tablet, TAKE 1 TABLET BY MOUTH BEFORE BEDTIME ON 05/15/22 TAKE 2 TABLETS BY MOUTH  DAILY BEFORE BEDTIME (Patient not taking: Reported on 05/21/2022), Disp: , Rfl:     ondansetron (ZOFRAN-ODT) 4 MG disintegrating tablet, Take 1 tablet (4 mg) by mouth every 6 (six) hours as needed for Nausea (Patient not taking: Reported on 04/06/2022), Disp: 8 tablet, Rfl: 0    Allergies   Allergen Reactions    Latex     Shellfish-Derived Products Angioedema       Medications and Allergies reviewed.    PHYSICAL EXAM     Vitals:    01/29/23 1923   BP: 128/85   Pulse: 91   Resp: 16   Temp: 97.8 F (36.6 C)       Physical Exam  Musculoskeletal:        Legs:       Comments: 2.5 inch soft lesion, reducible with plantar flexion, no erythema or tenderness   ROM of ankle and knee normal.   Sharp soft discrimination intact except for posterior lateral lower leg distal to hernia.    Pedal pulses and cap  refillnormal          UCC COURSE     There were no labs reviewed with this patient during the visit.    There were no x-rays reviewed with this patient during the visit.    No current facility-administered medications for this visit.       PROCEDURES     Procedures    MEDICAL DECISION MAKING     History, physical, labs/studies most consistent with hernia of tibialis anterior as the diagnosis.        Chart Review:  Prior PCP, Specialist and/or ED notes reviewed today: No  Prior labs/images/studies reviewed today: No    Differential Diagnosis: pt has hx of this, appears to have worsened, referrals to ortho and gen surg provided.       ASSESSMENT     Encounter Diagnosis   Name Primary?    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  Reviewed warning signs for worsening condition, as well as, indications for  follow-up with primary care physician and return to urgent care clinic.   Patient/family expressed understanding of instructions.     An After Visit Summary was provided to the patient.

## 2023-01-31 ENCOUNTER — Encounter (INDEPENDENT_AMBULATORY_CARE_PROVIDER_SITE_OTHER): Payer: Self-pay | Admitting: Physician Assistant

## 2023-02-10 ENCOUNTER — Encounter (INDEPENDENT_AMBULATORY_CARE_PROVIDER_SITE_OTHER): Payer: Self-pay

## 2023-02-11 ENCOUNTER — Encounter (INDEPENDENT_AMBULATORY_CARE_PROVIDER_SITE_OTHER): Payer: Self-pay

## 2023-02-11 ENCOUNTER — Encounter (INDEPENDENT_AMBULATORY_CARE_PROVIDER_SITE_OTHER): Payer: Self-pay | Admitting: Student in an Organized Health Care Education/Training Program

## 2023-02-11 ENCOUNTER — Ambulatory Visit (INDEPENDENT_AMBULATORY_CARE_PROVIDER_SITE_OTHER)
Payer: No Typology Code available for payment source | Admitting: Student in an Organized Health Care Education/Training Program

## 2023-02-11 DIAGNOSIS — M6289 Other specified disorders of muscle: Secondary | ICD-10-CM
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
## 2023-02-12 ENCOUNTER — Other Ambulatory Visit: Payer: Self-pay | Admitting: Student in an Organized Health Care Education/Training Program

## 2023-02-12 DIAGNOSIS — M6289 Other specified disorders of muscle: Secondary | ICD-10-CM | POA: Insufficient documentation

## 2023-02-12 NOTE — Progress Notes (Addendum)
SPOKE WITH PATIENT, POSTING SENT TO [facility] FOR DOS 02/17/23 W/ECW AT 1:30PM AND ARRIVE AT 11:30AM. PATIENT ASKED TO HAVE [facility] TOUCH BASE WITH HIM ON MONDAY 02/15/23 TO CONFIRM IF 03/15/23 WOULD WORK FOR BOTH HE AND DR. WILL.  EMAIL MESSAGE SENT TO [facility] TO FOLLOW UP WITH THE PATIENT UPON HER RETURN ON MONDAY, 02/15/23.  CONFIRMED DETAILS W/PATIENT  PRE CERT COMPLETED.   MYCHART CONFIRMATION SENT   PROCEDURE: REPAIR FASCIAL DEFECT LEFT LEG, MUSCLE DEBRIDEMENT, FASCIOCUTANEOUS FLAP, LEFT LEG  CPT/ICD: [id number]/M62.89

## 2023-02-15 ENCOUNTER — Telehealth: Payer: Self-pay

## 2023-02-15 ENCOUNTER — Encounter (INDEPENDENT_AMBULATORY_CARE_PROVIDER_SITE_OTHER): Payer: Self-pay

## 2023-02-15 NOTE — Telephone Encounter (Signed)
04.01.2024 2:10pm - patient requested call this day following conversation with team member on 03.29.2024.  Contacted patient to inquire if surgery date of 04.03.2024 works for him or if he would like to reschedule to another date.  Patient confirmed he would like to proceed with surgery on 04.03.2024.  Patient offered no further questions.

## 2023-02-16 ENCOUNTER — Ambulatory Visit (INDEPENDENT_AMBULATORY_CARE_PROVIDER_SITE_OTHER): Payer: No Typology Code available for payment source

## 2023-02-16 ENCOUNTER — Encounter (INDEPENDENT_AMBULATORY_CARE_PROVIDER_SITE_OTHER): Payer: Self-pay

## 2023-02-16 NOTE — PSS Phone Screening (Signed)
Pre-Anesthesia Evaluation    3/28 LOV by Dr Aletha Halim  Pre-op phone visit requested by:   Reason for pre-op phone visit: Patient anticipating REPAIR, FASCIAL DEFECT LEFT LEG W/DEBRIDEMENT AND FASIOCUTANEOUS FLAP procedure.         No orders of the defined types were placed in this encounter.      History of Present Illness/Summary:        Problem List:  Medical Problems       Hospital Problem List  Date Reviewed: 02/11/2023   None        Non-Hospital Problem List  Date Reviewed: 02/11/2023            ICD-10-CM Priority Class Noted    Left-sided weakness R53.1   12/02/2021    Mitral valve disease I05.9   01/13/2022    Hernia of fascia M62.89   02/12/2023        Medical History   Diagnosis Date    Abnormal vision     glasses    Anxiety     Attention deficit hyperactivity disorder (ADHD)     Depression     Difficulty walking     uses cane at times    Generalized weakness 2014    in left leg since 2014    Mitral valve prolapse     Neurological abnormality     Non-rheumatic mitral valve disease     Numbness     TIA (transient ischemic attack)     in 2021 per PT     Past Surgical History:   Procedure Laterality Date    RECONSTRUCTION, FACIAL  2014    left side near eye brow    TONSILLECTOMY      as child        Medication List            Accurate as of February 16, 2023 10:46 AM. Always use your most recent med list.                albuterol sulfate HFA 108 (90 Base) MCG/ACT inhaler  Inhale 2 puffs into the lungs every 6 (six) hours as needed for Wheezing or Shortness of Breath (cough)  Commonly known as: ProAir HFA  Notes to patient: Expired medication     amphetamine-dextroamphetamine 20 MG 24 hr capsule  Take 10 mg by mouth every morning  Commonly known as: ADDERALL XR  Medication Adjustments for Surgery: Hold day of surgery     hydrOXYzine 25 MG tablet  Take 1 tablet (25 mg) by mouth every 8 (eight) hours as needed for Itching  Commonly known as: ATARAX  Medication Adjustments for Surgery: Take as needed     LITHIUM PO  Take 300 mg  by mouth 3 (three) times daily  Medication Adjustments for Surgery: Take morning of surgery     QUEtiapine 100 MG tablet  Take 25 mg by mouth nightly  Commonly known as: SEROquel  Medication Adjustments for Surgery: Hold day of surgery            Allergies   Allergen Reactions    Shellfish-Derived Products Anaphylaxis    Latex Rash     And swelling     Family History   Problem Relation Age of Onset    Malignant hyperthermia Neg Hx     Anesthesia problems Neg Hx      Social History     Occupational History    Not on file   Tobacco Use    Smoking  status: Never     Passive exposure: Never    Smokeless tobacco: Never   Vaping Use    Vaping status: Never Used   Substance and Sexual Activity    Alcohol use: Yes     Comment: once monthly per PT    Drug use: No     Comment: marijuana 1-2x a month (edibles)    Sexual activity: Not on file           Exam Scores:   SDB score           STBUR score       PONV score  Nausea Risk: SEVERE RISK    MST score  MST Score: 0    PEN-FAST score       Frailty score       CHADsVasc            Visit Vitals  Ht 1.778 m (5\' 10" )   Wt 106.6 kg (235 lb)   BMI 33.72 kg/m

## 2023-02-17 ENCOUNTER — Ambulatory Visit: Payer: No Typology Code available for payment source

## 2023-02-17 ENCOUNTER — Encounter
Admission: RE | Disposition: A | Discharge status: Home / Self Care | Payer: Self-pay | Source: Ambulatory Visit | Attending: Student in an Organized Health Care Education/Training Program

## 2023-02-17 ENCOUNTER — Ambulatory Visit
Admission: RE | Admit: 2023-02-17 | Discharge: 2023-02-17 | Disposition: A | Discharge status: Home / Self Care | Payer: No Typology Code available for payment source | Source: Ambulatory Visit | Attending: Student in an Organized Health Care Education/Training Program | Admitting: Student in an Organized Health Care Education/Training Program

## 2023-02-17 DIAGNOSIS — M6289 Other specified disorders of muscle: Secondary | ICD-10-CM | POA: Insufficient documentation

## 2023-02-17 HISTORY — PX: RECONSTRUCTION, MYOCUTANEOUS FLAP: SHX89007

## 2023-02-17 HISTORY — PX: REPAIR, FASCIAL DEFECT: SHX5243

## 2023-02-17 HISTORY — PX: RELEASE, LOWER EXTREMITY CONTRACTURE: SHX5080

## 2023-02-17 SURGERY — REPAIR, FASCIAL DEFECT
Anesthesia: Anesthesia General | Site: Leg Lower | Laterality: Left | Wound class: Clean

## 2023-02-17 MED ORDER — BUPIVACAINE HCL (PF) 0.5 % IJ SOLN
INTRAMUSCULAR | Status: DC | PRN
Start: 2023-02-17 — End: 2023-02-17
  Administered 2023-02-17: 7 mL

## 2023-02-17 MED ORDER — CEFAZOLIN SODIUM 1 G IJ SOLR
INTRAMUSCULAR | Status: AC
Start: 2023-02-17 — End: ?
  Filled 2023-02-17: qty 2000

## 2023-02-17 MED ORDER — DEXAMETHASONE SODIUM PHOSPHATE 4 MG/ML IJ SOLN
INTRAMUSCULAR | Status: AC
Start: 2023-02-17 — End: ?
  Filled 2023-02-17: qty 1

## 2023-02-17 MED ORDER — MIDAZOLAM HCL 1 MG/ML IJ SOLN (WRAP)
INTRAMUSCULAR | Status: AC
Start: 2023-02-17 — End: ?
  Filled 2023-02-17: qty 2

## 2023-02-17 MED ORDER — ONDANSETRON HCL 4 MG/2ML IJ SOLN
INTRAMUSCULAR | Status: DC | PRN
Start: 2023-02-17 — End: 2023-02-17
  Administered 2023-02-17: 4 mg via INTRAVENOUS

## 2023-02-17 MED ORDER — PROPOFOL 10 MG/ML IV EMUL (WRAP)
INTRAVENOUS | Status: DC | PRN
Start: 2023-02-17 — End: 2023-02-17
  Administered 2023-02-17: 200 mg via INTRAVENOUS

## 2023-02-17 MED ORDER — SODIUM CHLORIDE 0.9 % IV SOLN
INTRAVENOUS | Status: DC | PRN
Start: 2023-02-17 — End: 2023-02-17
  Administered 2023-02-17: 8 ug via INTRAVENOUS

## 2023-02-17 MED ORDER — LIDOCAINE-EPINEPHRINE 1 %-1:100000 IJ SOLN
INTRAMUSCULAR | Status: DC | PRN
Start: 2023-02-17 — End: 2023-02-17
  Administered 2023-02-17: 7 mL

## 2023-02-17 MED ORDER — PROPOFOL 10 MG/ML IV EMUL (WRAP)
INTRAVENOUS | Status: AC
Start: 2023-02-17 — End: ?
  Filled 2023-02-17: qty 40

## 2023-02-17 MED ORDER — ONDANSETRON HCL 4 MG/2ML IJ SOLN
INTRAMUSCULAR | Status: AC
Start: 2023-02-17 — End: ?
  Filled 2023-02-17: qty 2

## 2023-02-17 MED ORDER — LIDOCAINE HCL (PF) 1 % IJ SOLN
INTRAMUSCULAR | Status: AC
Start: 2023-02-17 — End: ?
  Filled 2023-02-17: qty 5

## 2023-02-17 MED ORDER — MIDAZOLAM HCL 1 MG/ML IJ SOLN (WRAP)
INTRAMUSCULAR | Status: DC | PRN
Start: 2023-02-17 — End: 2023-02-17
  Administered 2023-02-17: 2 mg via INTRAVENOUS

## 2023-02-17 MED ORDER — GABAPENTIN 300 MG PO CAPS
ORAL_CAPSULE | ORAL | Status: AC
Start: 2023-02-17 — End: ?
  Filled 2023-02-17: qty 1

## 2023-02-17 MED ORDER — DEXAMETHASONE SODIUM PHOSPHATE 4 MG/ML IJ SOLN (WRAP)
INTRAMUSCULAR | Status: DC | PRN
Start: 2023-02-17 — End: 2023-02-17
  Administered 2023-02-17: 4 mg via INTRAVENOUS

## 2023-02-17 MED ORDER — ONDANSETRON HCL 4 MG/2ML IJ SOLN
4.0000 mg | Freq: Once | INTRAMUSCULAR | Status: DC | PRN
Start: 2023-02-17 — End: 2023-02-17

## 2023-02-17 MED ORDER — CELECOXIB 200 MG PO CAPS
200.0000 mg | ORAL_CAPSULE | Freq: Once | ORAL | Status: AC
Start: 2023-02-17 — End: 2023-02-17
  Administered 2023-02-17: 200 mg via ORAL

## 2023-02-17 MED ORDER — FENTANYL CITRATE (PF) 50 MCG/ML IJ SOLN (WRAP)
25.0000 ug | INTRAMUSCULAR | Status: DC | PRN
Start: 2023-02-17 — End: 2023-02-17

## 2023-02-17 MED ORDER — ACETAMINOPHEN 325 MG PO TABS
650.0000 mg | ORAL_TABLET | Freq: Once | ORAL | Status: DC | PRN
Start: 2023-02-17 — End: 2023-02-17

## 2023-02-17 MED ORDER — OXYCODONE HCL 5 MG PO TABS
ORAL_TABLET | ORAL | Status: AC
Start: 2023-02-17 — End: 2023-02-17
  Administered 2023-02-17: 5 mg via ORAL
  Filled 2023-02-17: qty 1

## 2023-02-17 MED ORDER — FENTANYL CITRATE (PF) 50 MCG/ML IJ SOLN (WRAP)
INTRAMUSCULAR | Status: AC
Start: 2023-02-17 — End: ?
  Filled 2023-02-17: qty 2

## 2023-02-17 MED ORDER — GABAPENTIN 300 MG PO CAPS
300.0000 mg | ORAL_CAPSULE | Freq: Once | ORAL | Status: AC
Start: 2023-02-17 — End: 2023-02-17
  Administered 2023-02-17: 300 mg via ORAL

## 2023-02-17 MED ORDER — ONDANSETRON 4 MG PO TBDP
4.0000 mg | ORAL_TABLET | Freq: Three times a day (TID) | ORAL | 0 refills | Status: DC | PRN
Start: 2023-02-17 — End: 2023-03-10

## 2023-02-17 MED ORDER — OXYCODONE-ACETAMINOPHEN 5-325 MG PO TABS
ORAL_TABLET | ORAL | Status: AC
Start: 2023-02-17 — End: ?
  Filled 2023-02-17: qty 1

## 2023-02-17 MED ORDER — CELECOXIB 200 MG PO CAPS
ORAL_CAPSULE | ORAL | Status: AC
Start: 2023-02-17 — End: ?
  Filled 2023-02-17: qty 1

## 2023-02-17 MED ORDER — KETAMINE HCL 10 MG/ML IJ/IV SOLN (WRAP)
Status: AC
Start: 2023-02-17 — End: ?
  Filled 2023-02-17: qty 5

## 2023-02-17 MED ORDER — HYDROMORPHONE HCL 1 MG/ML IJ SOLN
0.5000 mg | INTRAMUSCULAR | Status: DC | PRN
Start: 2023-02-17 — End: 2023-02-17

## 2023-02-17 MED ORDER — LACTATED RINGERS IV SOLN
INTRAVENOUS | Status: DC | PRN
Start: 2023-02-17 — End: 2023-02-17

## 2023-02-17 MED ORDER — OXYCODONE-ACETAMINOPHEN 5-325 MG PO TABS
1.0000 | ORAL_TABLET | ORAL | Status: DC | PRN
Start: 2023-02-17 — End: 2023-02-17
  Administered 2023-02-17: 1 via ORAL

## 2023-02-17 MED ORDER — OXYCODONE HCL 5 MG PO TABS
5.0000 mg | ORAL_TABLET | Freq: Four times a day (QID) | ORAL | 0 refills | Status: AC | PRN
Start: 2023-02-17 — End: 2023-02-24

## 2023-02-17 MED ORDER — LIDOCAINE HCL 2 % IJ SOLN
INTRAMUSCULAR | Status: DC | PRN
Start: 2023-02-17 — End: 2023-02-17
  Administered 2023-02-17: 50 mg via INTRAVENOUS

## 2023-02-17 MED ORDER — OXYCODONE HCL 5 MG PO TABS
5.0000 mg | ORAL_TABLET | Freq: Once | ORAL | Status: AC | PRN
Start: 2023-02-17 — End: 2023-02-17

## 2023-02-17 MED ORDER — KETAMINE HCL 50 MG/ML IJ/IV SOLN (WRAP)
Status: DC | PRN
Start: 2023-02-17 — End: 2023-02-17
  Administered 2023-02-17: 25 mg via INTRAVENOUS

## 2023-02-17 MED ORDER — SUCCINYLCHOLINE CHLORIDE 20 MG/ML IJ SOLN
INTRAMUSCULAR | Status: DC | PRN
Start: 2023-02-17 — End: 2023-02-17
  Administered 2023-02-17: 100 mg via INTRAVENOUS

## 2023-02-17 MED ORDER — FENTANYL CITRATE (PF) 50 MCG/ML IJ SOLN (WRAP)
INTRAMUSCULAR | Status: DC | PRN
Start: 2023-02-17 — End: 2023-02-17
  Administered 2023-02-17 (×2): 50 ug via INTRAVENOUS

## 2023-02-17 MED ORDER — ACETAMINOPHEN 500 MG PO TABS
ORAL_TABLET | ORAL | Status: AC
Start: 2023-02-17 — End: ?
  Filled 2023-02-17: qty 2

## 2023-02-17 MED ORDER — STERILE WATER FOR INJECTION IJ/IV SOLN (WRAP)
2.0000 g | Freq: Once | INTRAMUSCULAR | Status: AC
Start: 2023-02-17 — End: 2023-02-17
  Administered 2023-02-17: 2 g via INTRAVENOUS

## 2023-02-17 MED ORDER — ACETAMINOPHEN 500 MG PO TABS
1000.0000 mg | ORAL_TABLET | Freq: Once | ORAL | Status: AC
Start: 2023-02-17 — End: 2023-02-17
  Administered 2023-02-17: 1000 mg via ORAL

## 2023-02-17 SURGICAL SUPPLY — 49 items
ADHESIVE SKIN CLOSURE DERMABOND ADVANCED (Skin Closure) ×6
ADHESIVE SKIN CLOSURE DERMABOND ADVANCED .7 ML LIQUID APPLICATOR (Skin Closure) ×3 IMPLANT
APPLICATOR CHLORAPREP 26 ML 70% ISOPROPYL ALCOHOL 2% CHLORHEXIDINE (Applicator) ×3 IMPLANT
APPLICATOR PRP 70% ISPRP 2% CHG 26ML (Applicator) ×3
BINDER ABDOMINAL L63-78 IN 2XL STANDARD (Procedure Accessories)
BINDER ABDOMINAL L63-78 IN 2XL STANDARD PANEL ELASTIC HOOK LOOP (Procedure Accessories) ×3 IMPLANT
DRAIN OD.25 IN INTERNAL TREAD RADIOPAQUE (Drain)
DRAIN OD.25 IN INTERNAL TREAD RADIOPAQUE L18 IN INCISION SILICONE (Drain) IMPLANT
DRAPE SRG TBRN CNVRT 122X106X77IN LF (Drape) ×3
DRAPE SURGICAL IMPERVIOUS REINFORCEMENT FENESTRATE ABSORBENT ARMBOARD (Drape) ×3 IMPLANT
GLOVE SRG 7.5 ENCR LTX STRL PF TXTR BEAD (Glove) ×3
GLOVE SURGICAL 7 1/2 BIOGEL PI INDICATOR (Glove) ×3
GLOVE SURGICAL 7 1/2 BIOGEL PI INDICATOR UNDERGLOVE POWDER FREE SMOOTH (Glove) ×3 IMPLANT
GLOVE SURGICAL 7 1/2 ENCORE POWDER FREE TEXTURE BEAD CUFF ORTHOPAEDIC (Glove) ×3 IMPLANT
GOWN SURGICAL XL SMARTGOWN LEVEL 4 (Gown) ×9
GOWN SURGICAL XL SMARTGOWN LEVEL 4 BREATHABLE (Gown) ×9 IMPLANT
KIT RM TURNOVER LF NS DISP (Kits) ×3
KIT ROOM TURNOVER NONSTERILE LATEX FREE DISPOSABLE (Kits) ×3 IMPLANT
MESH CIRCLE VENTRAL HERNIA REPAIR (Mesh) ×3 IMPLANT
MESH CIRCLE VENTRAL HERNIA REPAIR POLYPROPYLENE HYDROGEL VENTRALIGHT (Mesh) IMPLANT
NEEDLE HPO SS PP RW BD 25GA 1.5IN LF (Needles) IMPLANT
PACK SRG LF STRL GN MIN SHR DISP (Tray)
PACK SRG UNV ULTRAGARD LF (Pack)
PACK SURGICAL GENERAL MINOR SHARE STERILE DISPOSABLE LATEX FREE (Tray) ×3 IMPLANT
PACK SURGICAL UNIVERSAL ULTRAGARD LATEX FREE (Pack) ×3 IMPLANT
PENCIL SMOKE EVACUATOR COATED PUSH (Cautery)
PENCIL SMOKE EVACUATOR COATED PUSH BUTTON NEPTUNE E-SEP (Cautery) ×3 IMPLANT
SPONGE GAUZE L6.75 IN X W6 IN ABSORBENCY (Procedure Accessories)
SPONGE GAUZE L6.75 IN X W6 IN ABSORBENCY COTTON (Procedure Accessories) IMPLANT
SUTURE COATED VICRYL 2-0 CT-1 L27 IN (Suture) ×6
SUTURE COATED VICRYL 2-0 CT-1 L27 IN BRAID COATED VIOLET ABSORBABLE (Suture) IMPLANT
SUTURE COATED VICRYL 2-0 SH L27 IN BRAID (Suture)
SUTURE COATED VICRYL 2-0 SH L27 IN BRAID COATED VIOLET ABSORBABLE (Suture) ×3 IMPLANT
SUTURE ETHIBOND EXCEL GREEN 2-0 CT-2 L30 (Suture) ×18
SUTURE ETHIBOND EXCEL GREEN 2-0 CT-2 L30 IN BRAID NONABSORBABLE (Suture) IMPLANT
SUTURE MONOCRYL 3-0 SH L27 IN (Suture)
SUTURE MONOCRYL 3-0 SH L27 IN MONOFILAMENT UNDYED ABSORBABLE (Suture) ×3 IMPLANT
SUTURE MONOCRYL 4-0 PS-2 L27 IN (Suture) ×6
SUTURE MONOCRYL 4-0 PS-2 L27 IN MONOFILAMENT UNDYED ABSORBABLE (Suture) ×3 IMPLANT
SUTURE PDS II 0 CT-1 L27 IN MONOFILAMENT (Suture)
SUTURE PDS II 0 CT-1 L27 IN MONOFILAMENT VIOLET ABSORBABLE (Suture) IMPLANT
SUTURE PROLENE BLUE 2-0 SH L36 IN 2 ARM (Suture)
SUTURE PROLENE BLUE 2-0 SH L36 IN 2 ARM MONOFILAMENT NONABSORBABLE (Suture) ×3 IMPLANT
SUTURE SILK PERMA HAND BLACK 2-0 L18 IN (Suture)
SUTURE SILK PERMA HAND BLACK 2-0 L18 IN BRAID TIES 12 STRAND PRECUT (Suture) ×3 IMPLANT
SYRINGE MED 10ML BD LF STRL ST DISP (Syringes, Needles) ×3 IMPLANT
TOWEL OR L27 IN X W17 IN STANDARD (Procedure Accessories) ×3
TOWEL OR L27 IN X W17 IN STD STRGHT PREWASH DELINT MEDLN COTTON BLUE (Procedure Accessories) ×3 IMPLANT
rebound air walker lg IMPLANT

## 2023-02-17 NOTE — Anesthesia Postprocedure Evaluation (Signed)
Anesthesia Post Evaluation    Patient: Kurt Rogers    Procedure(s):  REPAIR, FASCIAL DEFECT LEFT LEG  RELEASE, LOWER EXTREMITY CONTRACTURE  RECONSTRUCTION, FASCIOCUTANEOUS FLAP, LEFT LEG    Anesthesia type: general    Last Vitals:   Vitals Value Taken Time   BP 121/71 02/17/23 1730   Temp 36.7 C (98 F) 02/17/23 1713   Pulse 75 02/17/23 1730   Resp 14 02/17/23 1720   SpO2 99 % 02/17/23 1730                 Anesthesia Post Evaluation:     Patient Evaluated: PACU    Level of Consciousness: sleepy but conscious    Pain Management: adequate    Airway Patency: patent        Anesthetic complications: No      PONV Status: none    Cardiovascular status: acceptable  Respiratory status: acceptable  Hydration status: acceptable          Signed by: Particia Jasper, MD, 02/17/2023 5:35 PM

## 2023-02-17 NOTE — Anesthesia Preprocedure Evaluation (Signed)
Anesthesia Evaluation    AIRWAY    Mallampati: III    TM distance: >3 FB  Neck ROM: full  Mouth Opening:full   CARDIOVASCULAR    cardiovascular exam normal       DENTAL                   PULMONARY    pulmonary exam normal     OTHER FINDINGS                                        Relevant Problems   No relevant active problems               Anesthesia Plan    ASA 2     general               (Uses cane sometimes for left leg weakness.)      intravenous induction   Detailed anesthesia plan: general LMA        Post op pain management: per surgeon and PO analgesics    informed consent obtained    Plan discussed with CRNA.      pertinent labs reviewed               Signed by: Particia Jasper, MD 02/17/23 1:59 PM

## 2023-02-17 NOTE — Discharge Instr - AVS First Page (Addendum)
Discharge Summary        Patient Instructions:    Following Up:    If you already have an appointment, follow up at the specified time.     If you are recovering well at home, you do not have to make an in person follow up appointment. If you would like to be seen in person, please call the attached number.     Diet:  For the first 24 hours after surgery, you may not have much of an appetite or feel able to tolerate heavy foods.  We encourage you to keep up with your liquids.  As your appetite increases over the next few days, you will find yourself eating normally.  A regular diet without restrictions is recommended.      Activity:  After your discharge, you may be up and around as you desire. You should use crutches when you are up and around to offload your left leg. Keep your left leg in the provided boot during the day and during mobilization. You may take the boot off to shower and to sleep.   Toe touch only   No weight as much as possible    Medications:  You will be given a prescription for pain medication.  Take this as directed for post-operative pain.  If you are experiencing only mild discomfort, you may find that over-the-counter medication, such as Tylenol (acetaminophen) or Advil/Nuprin (ibuprofen), may be all you need for comfort.  If constipation becomes a problem, a stool softener (Metamucil) or a mild laxative (Milk of Magnesia/Miralax) may be taken.    One effective way to manage your non-narcotic pain medication is to alternate your medication every 4 hours between acetaminophen and ibuprofen.  Sample NSAID post-operative medication schedule:  6 am 10 am 2 pm 6 pm 10 pm (2 am)   400-800 mg ibuprofen   (Advil, Motrin) [phone] mg acetaminophen (Tylenol) 400-800 mg ibuprofen (Advil, Motrin) [phone] mg acetaminophen (Tylenol) 400-800 mg ibuprofen (Advil, Motrin) [phone] mg acetaminophen (Tylenol)     Bandage and Incision Care:  You may shower after your surgery.  Leave the surgical glue in place  - it will fall off on its own. NO TUB BATHS or swimming for 1 week.  You may notice a slight drainage (usually pink or reddish in color) or bruising at or around the incision site.  This is normal and not a cause for concern.     If Difficulties Arise:  Please call us if any problems or questions arise.  We can be reached any time, including evenings and weekends, by calling our office number [phone]      Call your doctor if you have any of the following:  Fever over 101F or chills  Increasing pain, redness, or drainage at an incision site  Vomiting or nausea that lasts more than 12 hours  Prolonged diarrhea       Post Anesthesia Discharge Instructions    Although you may be awake and alert in the recovery room, small amounts of anesthetic remain in your system for about 24 hours.  You may feel tired and sleepy during this time.      You are advised to go directly home from the hospital.    Plan to stay at home and rest for the remainder of the day.    It is advisable to have someone with you at home for 24 hours after surgery.    Do not operate a motor vehicle, or  any mechanical or electrical equipment for the next 24 hours.      Be careful when you are walking around, you may become dizzy.  The effects of anesthesia and/or medications are still present and drowsiness may occur    Do not consume alcohol, tranquilizers, sleeping medications, or any other non prescribed medication for the remainder of the day.    Diet:  begin with liquids, progress your diet as tolerated or as directed by your surgeon.  Nausea and vomiting may occur in the next 24 hours.

## 2023-02-17 NOTE — Interval H&P Note (Signed)
I have reviewed the H&P, examined the patient and there are no changes.    Proceed to OR for repair of left lower extremity hernia.    Michael Boston Madhuri Vacca, DO

## 2023-02-17 NOTE — PACU (Deleted)
Assumed care of patient from Dale at this time.

## 2023-02-17 NOTE — Progress Notes (Signed)
PACU transfer criteria met. On transfer patient easily arousable. Respirations even and unlabored. IV patent. Vital signs stable. Patient reports pain level as tolerable. LLE dressing clean, dry and intact. Patient brought to Phase 2. Handoff given to receiving RN Ndidi.

## 2023-02-17 NOTE — Op Note (Signed)
FULL OPERATIVE NOTE    Date Time: 02/17/23 5:30 PM  Patient Name: Kurt Rogers, Kurt Rogers (MRN: [id number])  Attending Physician: [name], DO      Date of Operation:   02/17/2023    Providers Performing:   Surgeons and Role:     * Naiya Corral, Michael Boston, DO - Primary     * Talbert Forest, MD - Resident - Assisting    Surgical First Assistant(s):   None    Operative Procedure:   REPAIR, FASCIAL DEFECT LEFT LEG: [phone] (CPT)  RELEASE, LOWER EXTREMITY CONTRACTURE: [phone] (CPT)  RECONSTRUCTION, FASCIOCUTANEOUS FLAP, LEFT LEG: [id number] (CPT)    Preoperative Diagnosis:   Pre-Op Diagnosis Codes:     * Hernia of fascia [M62.89]    Postoperative Diagnosis:   Post-Op Diagnosis Codes:     * Hernia of fascia [M62.89]    Anesthesia:   general    Findings:   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 The defect had gotten larger over time and was now painful with most activities. As such, he was seeking elective repair. We discussed the various surgical options and he consented to proceed with reconstruction.     Operative Notes:     Preoperatively the patient was marked overlying the defect in dorsiflexion and plantarflexion. After appropriate consent was obtained, the patient was brought to the operating room and placed in the supine position. With continuous pulse oximetry and cardiac monitoring, and with sequential compression devices placed on the right leg only, the patient was given a general anesthetic and prepped and draped in the usual sterile fashion.     Following the surgical pause, local anesthetic was injected as a field block. An incision was made sharply overlying the premarked area with a 15 blade. The wound was deepened with cautery. We reached the underlying bulging muscle and then used cautery to expose the remainder of the anterior tibialis fascia. We were able to appreciate an  ovoid fascial defect which did not approximate primarily as there was too much tension. We attempted to reduce the muscle into the fascial sheath in order to perform mesh repair, but the muscle would not reduce. As such, we proceeded as discussed with fasciotomy. The fascia cranially and caudally was incised cranially and caudally at the apices of the ovoid defect in order to equalize the pressure on the eventrated muscle. This was performed while protecting the underlying tissue from any nerve injury. Once fasciotomy was performed, the muscle belly was able to be reduced into an anatomically appropriate position. We then performed repair of the fascial defect by utilizing a piece of mesh which had been measured and cut to defect size with additional 1cm of overlap on all sides. We used ethibond sutures in mattress fashion circumferentially to capture the mesh and bring it under tension overlying the muscle, successfully recreating a continuous layer with the native fascia. We took great care not to trap any underlying muscle in our closure. There was good overlap of fascial edge over the mesh. Once these were all secured, we used further interspaced interrupted ethibond sutures in the intervening gaps to secure the mesh further. Once we were satisfied with the repair and with hemostasis, we proceeded with our fasciocutaneous flap closure. The fasciocutaneous flap was mobilized superficial to the repaired defect with cautery, this was conducted around the previously incised wound. Once we  had mobilized the fasciocutaneous flap from the underlying repair, the wound approximated well without tension. We used 2-0 vicryl sutures in interrupted fashion to bring our fasciocutaneous flap into approximate position. Once this was secured, further vicryl suture was used to close the dermis, and 4-0 monocryl was used to close skin. Dermabond was applied as final dressing.     The sponge and needle count were correct, and a  surgical debrief was performed. The patient tolerated the procedure well and left for the recovery room in good condition. I was present for and participated throughout the procedure.                 Estimated Blood Loss:   5 mL    Implants:     Implant Name Type Inv. Item Serial No. Model No. Manufacturer Lot No. LRB No. Used Action   MESH CIRCLE VENTRAL HERNIA REPAIR - [id number] Mesh MESH CIRCLE VENTRAL HERNIA REPAIR  [id number] BARD DAVOL [id number] Left 1 Implanted          Drains:   Drains: no      Specimens:   None      Complications:   None    Signed by: Michael Boston Oiva Dibari, DO  [facility]

## 2023-02-17 NOTE — Transfer of Care (Signed)
Anesthesia Transfer of Care Note    Patient: Delwin Hundt    Procedures performed: Procedure(s):  REPAIR, FASCIAL DEFECT LEFT LEG W/DEBRIDEMENT AND FASIOCUTANEOUS FLAP    Anesthesia type: General ETT    Patient location:Phase I PACU    Last vitals:   Vitals:    02/17/23 1713   BP: 124/70   Pulse: 80   Resp: 18   Temp: 36.7 C (98 F)   SpO2: 93%       Post pain: Patient not complaining of pain, continue current therapy      Mental Status:awake    Respiratory Function: tolerating face mask    Cardiovascular: stable    Nausea/Vomiting: patient not complaining of nausea or vomiting    Hydration Status: adequate    Post assessment: no apparent anesthetic complications, no reportable events, and no evidence of recall    Signed by: Morrell Riddle, CRNA  02/17/23 5:13 PM

## 2023-02-17 NOTE — Progress Notes (Signed)
Patient's friend, Rob called and updated. Advised to pick up patient's medications from pharmacy prior to picking patient up.

## 2023-02-18 ENCOUNTER — Encounter: Payer: Self-pay | Admitting: Student in an Organized Health Care Education/Training Program

## 2023-02-19 ENCOUNTER — Emergency Department: Payer: No Typology Code available for payment source

## 2023-02-19 ENCOUNTER — Telehealth: Payer: Self-pay

## 2023-02-19 ENCOUNTER — Emergency Department
Admission: EM | Admit: 2023-02-19 | Discharge: 2023-02-19 | Disposition: A | Discharge status: Home / Self Care | Payer: No Typology Code available for payment source | Attending: Emergency Medicine | Admitting: Emergency Medicine

## 2023-02-19 DIAGNOSIS — R531 Weakness: Secondary | ICD-10-CM | POA: Insufficient documentation

## 2023-02-19 DIAGNOSIS — R0602 Shortness of breath: Secondary | ICD-10-CM | POA: Insufficient documentation

## 2023-02-19 DIAGNOSIS — T50905A Adverse effect of unspecified drugs, medicaments and biological substances, initial encounter: Secondary | ICD-10-CM

## 2023-02-19 DIAGNOSIS — Z9889 Other specified postprocedural states: Secondary | ICD-10-CM | POA: Insufficient documentation

## 2023-02-19 LAB — CBC AND DIFFERENTIAL
Absolute NRBC: 0 10*3/uL (ref 0.00–0.00)
Basophils Absolute Automated: 0.09 10*3/uL — ABNORMAL HIGH (ref 0.00–0.08)
Basophils Automated: 0.8 %
Eosinophils Absolute Automated: 0.19 10*3/uL (ref 0.00–0.44)
Eosinophils Automated: 1.7 %
Hematocrit: 45 % (ref 37.6–49.6)
Hgb: 15.3 g/dL (ref 12.5–17.1)
Immature Granulocytes Absolute: 0.02 10*3/uL (ref 0.00–0.07)
Immature Granulocytes: 0.2 %
Instrument Absolute Neutrophil Count: 5.77 10*3/uL (ref 1.10–6.33)
Lymphocytes Absolute Automated: 3.77 10*3/uL — ABNORMAL HIGH (ref 0.42–3.22)
Lymphocytes Automated: 34.5 %
MCH: 27.4 pg (ref 25.1–33.5)
MCHC: 34 g/dL (ref 31.5–35.8)
MCV: 80.5 fL (ref 78.0–96.0)
MPV: 9.5 fL (ref 8.9–12.5)
Monocytes Absolute Automated: 1.09 10*3/uL — ABNORMAL HIGH (ref 0.21–0.85)
Monocytes: 10 %
Neutrophils Absolute: 5.77 10*3/uL (ref 1.10–6.33)
Neutrophils: 52.8 %
Nucleated RBC: 0 /100 WBC (ref 0.0–0.0)
Platelets: 341 10*3/uL (ref 142–346)
RBC: 5.59 10*6/uL (ref 4.20–5.90)
RDW: 12 % (ref 11–15)
WBC: 10.93 10*3/uL — ABNORMAL HIGH (ref 3.10–9.50)

## 2023-02-19 LAB — ECG 12-LEAD
Atrial Rate: 63 {beats}/min
IHS MUSE NARRATIVE AND IMPRESSION: NORMAL
P Axis: 31 degrees
P-R Interval: 178 ms
Q-T Interval: 378 ms
QRS Duration: 90 ms
QTC Calculation (Bezet): 386 ms
R Axis: 86 degrees
T Axis: 5 degrees
Ventricular Rate: 63 {beats}/min

## 2023-02-19 LAB — COMPREHENSIVE METABOLIC PANEL
ALT: 34 U/L (ref 0–55)
AST (SGOT): 22 U/L (ref 5–41)
Albumin/Globulin Ratio: 1.6 (ref 0.9–2.2)
Albumin: 4.2 g/dL (ref 3.5–5.0)
Alkaline Phosphatase: 77 U/L (ref 37–117)
Anion Gap: 7 (ref 5.0–15.0)
BUN: 14 mg/dL (ref 9.0–28.0)
Bilirubin, Total: 0.3 mg/dL (ref 0.2–1.2)
CO2: 26 mEq/L (ref 17–29)
Calcium: 9.6 mg/dL (ref 8.5–10.5)
Chloride: 109 mEq/L (ref 99–111)
Creatinine: 1.2 mg/dL (ref 0.5–1.5)
Globulin: 2.6 g/dL (ref 2.0–3.6)
Glucose: 96 mg/dL (ref 70–100)
Potassium: 4 mEq/L (ref 3.5–5.3)
Protein, Total: 6.8 g/dL (ref 6.0–8.3)
Sodium: 142 mEq/L (ref 135–145)
eGFR: >60 mL/min/{1.73_m2} (ref >=60)

## 2023-02-19 LAB — HIGH SENSITIVITY TROPONIN-I: hs Troponin-I: <2.7 ng/L

## 2023-02-19 LAB — LACTIC ACID: Lactic Acid: 0.7 mmol/L (ref 0.2–2.0)

## 2023-02-19 LAB — D-DIMER: D-Dimer: 0.23 ug/mL FEU (ref 0.00–0.60)

## 2023-02-19 MED ORDER — SODIUM CHLORIDE 0.9 % IV BOLUS
2000.0000 mL | Freq: Once | INTRAVENOUS | Status: AC
Start: 2023-02-19 — End: 2023-02-19
  Administered 2023-02-19: 2000 mL via INTRAVENOUS

## 2023-02-19 MED ORDER — ALBUTEROL SULFATE (2.5 MG/3ML) 0.083% IN NEBU
5.0000 mg | INHALATION_SOLUTION | Freq: Once | RESPIRATORY_TRACT | Status: AC
Start: 2023-02-19 — End: 2023-02-19
  Administered 2023-02-19: 5 mg via RESPIRATORY_TRACT
  Filled 2023-02-19: qty 6

## 2023-02-19 MED ORDER — IPRATROPIUM BROMIDE 0.02 % IN SOLN
0.5000 mg | Freq: Once | RESPIRATORY_TRACT | Status: AC
Start: 2023-02-19 — End: 2023-02-19
  Administered 2023-02-19: 0.5 mg via RESPIRATORY_TRACT
  Filled 2023-02-19: qty 2.5

## 2023-02-19 MED ORDER — TRAMADOL HCL 50 MG PO TABS
25.0000 mg | ORAL_TABLET | Freq: Four times a day (QID) | ORAL | 0 refills | Status: DC | PRN
Start: 2023-02-19 — End: 2023-02-25

## 2023-02-19 MED ORDER — IOHEXOL 350 MG/ML IV SOLN
100.0000 mL | Freq: Once | INTRAVENOUS | Status: AC | PRN
Start: 2023-02-19 — End: 2023-02-19
  Administered 2023-02-19: 100 mL via INTRAVENOUS

## 2023-02-19 MED ORDER — FENTANYL CITRATE (PF) 50 MCG/ML IJ SOLN (WRAP)
50.0000 ug | INTRAMUSCULAR | Status: DC | PRN
Start: 2023-02-19 — End: 2023-02-19

## 2023-02-19 MED ORDER — ALBUTEROL SULFATE HFA 108 (90 BASE) MCG/ACT IN AERS
2.0000 | INHALATION_SPRAY | RESPIRATORY_TRACT | 0 refills | Status: AC | PRN
Start: 2023-02-19 — End: 2023-03-21

## 2023-02-19 NOTE — Telephone Encounter (Signed)
Pt called after hours on 04/05 and spoke with Dr. Stann Mainland re: "post op questions/ not feeling right." Pt s/p L fascial defect leg repair/lower extremity contracture/reconstruction fasciocutaneous flap 04/03 w/ Dr. Stann Mainland.   Pt was seen and treated in ED for SOB/generalized weakness.  Pt has post op visit scheduled 04/11.

## 2023-02-19 NOTE — ED Provider Notes (Signed)
EMERGENCY DEPARTMENT HISTORY AND PHYSICAL EXAM      Patient Name: Kurt Rogers  Age: 32 y.o. male  Encounter Date:  02/19/2023  Department:[facility]  Patient Room: GR6/GR6  PCP: Octavia Bruckner, MD       History of Presenting Illness     Chief Complaint   Patient presents with    Shortness of Breath    Generalized weakness       History Provided By: {SAHPI_1:23370}    History obtained from a source other than the patient: {Yes/No/NA:58344}. Why: To obtain information in addition to that relayed by the patient.***    Kurt Rogers is a 32 y.o. male with ***     I reviewed patient's last ED visit, clinic visit or admission/discharge summary, as well as associated recent EKGs, lab or imaging results, if applicable.     Review of Systems     Please refer to HPI for pertinent positives and negatives.     Physical Exam   BP (!) 145/91   Pulse 65   Temp 97.9 F (36.6 C) (Oral)   Resp 20   Wt 108.9 kg   SpO2 95%   BMI 34.44 kg/m     Physical Exam      Medical Decision Making   I am the first provider for this patient.    I reviewed the vital signs, available nursing notes, allergies, past medical history, past surgical history, family history and social history. If pertinent, they are mentioned in HPI.     Personal Protective Equipment (PPE)  Gloves, surgical hat and surgical mask.    Provider Notes/Summary:     32 y.o. male with ***     Pulse Oximetry Analysis:  Interpreted by me. ***% on *** - {PulseOx charting:47805}  Cardiac Monitor: Interpreted by me. Rhythm:  {Rhythm:16023332}, Rate:  {Rate:16023334}, Ectopy:  {Ectopy:16023333}    EKG: Interpreted by me, the Emergency Physician.   Time Interpreted: ***  Rate: ***  Rhythm: {EKG RHYTHM :29264}  Interpretation: QTc ***, no ST elevations or TWIs  Comparison: {EKG COMPARISON :29265}    Labs: All labs have been ordered, reviewed and interpreted by me. See Provider Notes/Summary section for discussion. ***  Xrays: Ordered, reviewed and  interpreted by me, confirmed by radiology report. See Provider Notes/Summary section for discussion. ***  CT/US/MRI, as applicable: Ordered and reviewed  by me, confirmed by radiology report. See Provider Notes/Summary section for discussion.***    The patient and/or family is/are aware that today's emergency department evaluation has limitations and is only a screening that can be falsely reassuring.  We discussed the need for follow up and strict return precautions. Patient and/or family demonstrate verbal understanding that they can return to the emergency department at any given time if they are having worsening symptoms, other complaints or difficulty with followup.***  __________________________________________________________________    Clinical Decision Support:   {TIP  Decision Support:55325}    Critical Care Time:       Procedures:       For Hospitalized Patients:    Hospitalization Decision Time:    I have discussed this case with Dr. Marland Kitchen {AdmittingService:53790} at *** on ***, who accepts patient for admission and requests {Obs vs Inpatient:53791} {Dispo Unit:53792} bed.     For Surgical/Procedural Admissions:    Anticoagulated: {YES/NO:21936}. If yes, name of medication: ***  Last PO intake: ***  Current NPO status: ***     Consultant(s):     I have discussed  this case with consultant Dr. Marland Kitchen at [facility] on ***.  Recommendations were as follows: ***      Core Measures:     - 12-lead EKG was performed in the ED. Aspirin: {GRAFASPIRIN:39611}.    SEP-1 Charting   ***    ED Course:       Diagnosis     Clinical Impression: No diagnosis found. {TIP  Disposition:55325}    Disposition:   ED Disposition       None            The above diagnostic process was due to medical necessity based on risk stratification of potential harm of patient's presenting complaint.     CHART OWNERSHIP: This note is prepared by Enis Gash, MD, PHD, FACEP. I am the first provider for this patient.    This note was generated by the  [employer] EMR system/ Dragon speech recognition and may contain inherent errors or omissions not intended by the user. Grammatical errors, random word insertions, deletions and pronoun errors  are occasional consequences of this technology due to software limitations. Not all errors are caught or corrected. If there are questions or concerns about the content of this note or information contained within the body of this dictation they should be addressed directly with the author for clarification.    Electronically signed by Enis Gash, MD, PHD, [place]

## 2023-02-19 NOTE — ED Triage Notes (Signed)
Kurt Rogers is a 32 y.o. male presenting with shortness of breath and generalized weakness since surgery of LLE on 02/17/23. Spoke with surgeon Dr. Stann Mainland and referred here.

## 2023-02-25 ENCOUNTER — Ambulatory Visit (INDEPENDENT_AMBULATORY_CARE_PROVIDER_SITE_OTHER)
Payer: No Typology Code available for payment source | Admitting: Student in an Organized Health Care Education/Training Program

## 2023-02-25 ENCOUNTER — Encounter (INDEPENDENT_AMBULATORY_CARE_PROVIDER_SITE_OTHER): Payer: Self-pay

## 2023-02-25 ENCOUNTER — Encounter (INDEPENDENT_AMBULATORY_CARE_PROVIDER_SITE_OTHER): Payer: Self-pay | Admitting: Student in an Organized Health Care Education/Training Program

## 2023-02-25 VITALS — BP 126/83 | HR 79 | Temp 98.0°F | Resp 16 | Ht 70.0 in | Wt 243.0 lb

## 2023-02-25 DIAGNOSIS — Z9889 Other specified postprocedural states: Secondary | ICD-10-CM

## 2023-02-25 NOTE — Progress Notes (Signed)
Visit Date Time: 02/25/2023  1:40 PM     Referring Provider:    [facility] Provider: Barbara Cower Will, DO    POSTOP NOTE:    Reason for visit:  Here for a postop visit    Kurt Rogers is a 32 y.o. male who  has a past medical history of Abnormal vision, Anxiety, Attention deficit hyperactivity disorder (ADHD), Depression, Difficulty walking, Generalized weakness (2014), Mitral valve prolapse, Neurological abnormality, Non-rheumatic mitral valve disease, Numbness, and TIA (transient ischemic attack).Marland Kitchen  He  presents after recent surgery.  He had  repair of left leg fascial defecct  by Dr. Stann Mainland at [facility].     Pathology: Not sent    He reports uneventful postop recovery and denies any other issues.    The patient has been to the ED since surgery/last visit.  The patient did not require readmission since surgery/last visit.    Additional issues: None.    Past Medical History:     Past Medical History:   Diagnosis Date    Abnormal vision     glasses    Anxiety     Attention deficit hyperactivity disorder (ADHD)     Depression     Difficulty walking     uses cane at times    Generalized weakness 2014    in left leg since 2014    Mitral valve prolapse     Neurological abnormality     Non-rheumatic mitral valve disease     Numbness     TIA (transient ischemic attack)     in 2021 per PT       Past Surgical History:     Past Surgical History:   Procedure Laterality Date    RECONSTRUCTION, FACIAL  2014    left side near eye brow    RECONSTRUCTION, MYOCUTANEOUS FLAP  02/17/2023    Procedure: RECONSTRUCTION, FASCIOCUTANEOUS FLAP, LEFT LEG;  Surgeon: Lorene Dy, DO;  Location: [facility];  Service: General;;    RELEASE, LOWER EXTREMITY CONTRACTURE  02/17/2023    Procedure: RELEASE, LOWER EXTREMITY CONTRACTURE;  Surgeon: Lorene Dy, DO;  Location: [facility];  Service: General;;    REPAIR, FASCIAL DEFECT Left 02/17/2023    Procedure: REPAIR, FASCIAL DEFECT LEFT LEG;  Surgeon: Lorene Dy, DO;  Location: [facility];   Service: General;  Laterality: Left;    TONSILLECTOMY      as child       Family History:     Family History   Problem Relation Age of Onset    Malignant hyperthermia Neg Hx     Anesthesia problems Neg Hx        Social History:     Social History     Socioeconomic History    Marital status: Single   Tobacco Use    Smoking status: Never     Passive exposure: Never    Smokeless tobacco: Never   Vaping Use    Vaping status: Never Used   Substance and Sexual Activity    Alcohol use: Yes     Comment: once monthly per PT    Drug use: No     Comment: marijuana 1-2x a month (edibles)     Social Determinants of Health     Food Insecurity: No Food Insecurity (02/19/2023)    Hunger Vital Sign     Worried About Running Out of Food in the Last Year: Never true     Ran Out of Food in  the Last Year: Never true   Transportation Needs: No Transportation Needs (02/19/2023)    PRAPARE - Therapist, art (Medical): No     Lack of Transportation (Non-Medical): No   Physical Activity: Unknown (02/19/2023)    Exercise Vital Sign     Days of Exercise per Week: Patient declined   Intimate Partner Violence: Not At Risk (02/19/2023)    Humiliation, Afraid, Rape, and Kick questionnaire     Fear of Current or Ex-Partner: No     Emotionally Abused: No     Physically Abused: No     Sexually Abused: No   Housing Stability: Unknown (02/19/2023)    Housing Stability Vital Sign     Unable to Pay for Housing in the Last Year: No     Unstable Housing in the Last Year: No       Allergies:   Shellfish-derived products and Latex    Medications:     Current Outpatient Medications   Medication Sig Dispense Refill    albuterol sulfate HFA (PROVENTIL) 108 (90 Base) MCG/ACT inhaler Inhale 2 puffs into the lungs every 4 (four) hours as needed for Shortness of Breath Dispense with spacer 18 g 0    amphetamine-dextroamphetamine (ADDERALL XR) 20 MG 24 hr capsule Take 10 mg by mouth every morning      LITHIUM PO Take 300 mg by mouth 3 (three) times  daily      ondansetron (ZOFRAN-ODT) 4 MG disintegrating tablet Take 1 tablet (4 mg) by mouth every 8 (eight) hours as needed for Nausea 10 tablet 0    QUEtiapine (SEROquel) 100 MG tablet Take 25 mg by mouth nightly      hydrOXYzine (ATARAX) 25 MG tablet Take 1 tablet (25 mg) by mouth every 8 (eight) hours as needed for Itching 20 tablet 0     No current facility-administered medications for this visit.       Review of Systems:     ROS   As per the HPI and above. The patient otherwise denies any additional changes to their otic, opthalmologic, dermatologic, pulmonary, cardiac, gastrointestinal, genitourinary, musculoskeletal, hematologic, constitutional, or psychiatric systems.      Physical Exam:     Vitals:    02/25/23 1316   BP: 126/83   Pulse: 79   Resp: 16   Temp: 98 F (36.7 C)   SpO2: 95%       Appears comfortable  No labored breathing  The wound(s) demonstrate good healing and appearance. and There are no sutures or staples to remove and no incisional care needed.    Assessment:     Patient Active Problem List   Diagnosis    Left-sided weakness    Mitral valve disease     1. Post-operative state       Plan:     Postop instructions: wound care instructions given ( remove walking boot 3 weeks postop and resume regular activity )    Follow up: as needed    Thank you for sending this patient to [facility] for surgical treatment.  We appreciate the opportunity to participate in their care.  Their surgical issue has been addressed, and they will be returned to your care and/or their PCP for any ongoing issues.  If you have any questions, do not hesitate to contact us at [phone].    Signed by: Barbara Cower Will, DO

## 2023-03-10 ENCOUNTER — Inpatient Hospital Stay: Payer: No Typology Code available for payment source

## 2023-03-10 ENCOUNTER — Emergency Department: Payer: No Typology Code available for payment source

## 2023-03-10 ENCOUNTER — Observation Stay
Admission: EM | Admit: 2023-03-10 | Discharge: 2023-03-17 | Disposition: A | Discharge status: Skilled Nursing Facility | Payer: No Typology Code available for payment source | Attending: Student in an Organized Health Care Education/Training Program | Admitting: Student in an Organized Health Care Education/Training Program

## 2023-03-10 DIAGNOSIS — R2990 Unspecified symptoms and signs involving the nervous system: Secondary | ICD-10-CM

## 2023-03-10 DIAGNOSIS — F909 Attention-deficit hyperactivity disorder, unspecified type: Secondary | ICD-10-CM | POA: Insufficient documentation

## 2023-03-10 DIAGNOSIS — Z5181 Encounter for therapeutic drug level monitoring: Secondary | ICD-10-CM

## 2023-03-10 DIAGNOSIS — I639 Cerebral infarction, unspecified: Secondary | ICD-10-CM

## 2023-03-10 DIAGNOSIS — Z8659 Personal history of other mental and behavioral disorders: Secondary | ICD-10-CM

## 2023-03-10 DIAGNOSIS — R251 Tremor, unspecified: Secondary | ICD-10-CM | POA: Insufficient documentation

## 2023-03-10 DIAGNOSIS — I10 Essential (primary) hypertension: Secondary | ICD-10-CM | POA: Insufficient documentation

## 2023-03-10 DIAGNOSIS — R531 Weakness: Principal | ICD-10-CM | POA: Insufficient documentation

## 2023-03-10 DIAGNOSIS — F32A Depression, unspecified: Secondary | ICD-10-CM

## 2023-03-10 DIAGNOSIS — Z7902 Long term (current) use of antithrombotics/antiplatelets: Secondary | ICD-10-CM

## 2023-03-10 DIAGNOSIS — I059 Rheumatic mitral valve disease, unspecified: Secondary | ICD-10-CM

## 2023-03-10 DIAGNOSIS — M47812 Spondylosis without myelopathy or radiculopathy, cervical region: Secondary | ICD-10-CM | POA: Insufficient documentation

## 2023-03-10 DIAGNOSIS — Z79899 Other long term (current) drug therapy: Secondary | ICD-10-CM | POA: Insufficient documentation

## 2023-03-10 DIAGNOSIS — G9389 Other specified disorders of brain: Secondary | ICD-10-CM | POA: Insufficient documentation

## 2023-03-10 DIAGNOSIS — F419 Anxiety disorder, unspecified: Secondary | ICD-10-CM | POA: Insufficient documentation

## 2023-03-10 DIAGNOSIS — F319 Bipolar disorder, unspecified: Secondary | ICD-10-CM | POA: Insufficient documentation

## 2023-03-10 DIAGNOSIS — E669 Obesity, unspecified: Secondary | ICD-10-CM | POA: Insufficient documentation

## 2023-03-10 DIAGNOSIS — Z6838 Body mass index (BMI) 38.0-38.9, adult: Secondary | ICD-10-CM | POA: Insufficient documentation

## 2023-03-10 DIAGNOSIS — R200 Anesthesia of skin: Secondary | ICD-10-CM | POA: Insufficient documentation

## 2023-03-10 DIAGNOSIS — Z8249 Family history of ischemic heart disease and other diseases of the circulatory system: Secondary | ICD-10-CM | POA: Insufficient documentation

## 2023-03-10 LAB — COMPREHENSIVE METABOLIC PANEL
ALT: 78 U/L — ABNORMAL HIGH (ref 0–55)
ALT: 65 U/L — ABNORMAL HIGH (ref 0–55)
AST (SGOT): 33 U/L (ref 5–41)
AST (SGOT): 28 U/L (ref 5–41)
Albumin/Globulin Ratio: 1.7 (ref 0.9–2.2)
Albumin/Globulin Ratio: 1.6 (ref 0.9–2.2)
Albumin: 4.8 g/dL (ref 3.5–5.0)
Albumin: 4.2 g/dL (ref 3.5–5.0)
Alkaline Phosphatase: 82 U/L (ref 37–117)
Alkaline Phosphatase: 72 U/L (ref 37–117)
Anion Gap: 11 (ref 5.0–15.0)
Anion Gap: 10 (ref 5.0–15.0)
BUN: 15 mg/dL (ref 9.0–28.0)
BUN: 12 mg/dL (ref 9.0–28.0)
Bilirubin, Total: 0.6 mg/dL (ref 0.2–1.2)
Bilirubin, Total: 0.6 mg/dL (ref 0.2–1.2)
CO2: 26 mEq/L (ref 17–29)
CO2: 23 mEq/L (ref 17–29)
Calcium: 10.1 mg/dL (ref 8.5–10.5)
Calcium: 9.6 mg/dL (ref 8.5–10.5)
Chloride: 104 mEq/L (ref 99–111)
Chloride: 106 mEq/L (ref 99–111)
Creatinine: 1 mg/dL (ref 0.5–1.5)
Creatinine: 0.9 mg/dL (ref 0.5–1.5)
Globulin: 2.9 g/dL (ref 2.0–3.6)
Globulin: 2.6 g/dL (ref 2.0–3.6)
Glucose: 88 mg/dL (ref 70–100)
Glucose: 90 mg/dL (ref 70–100)
Potassium: 4.1 mEq/L (ref 3.5–5.3)
Potassium: 3.9 mEq/L (ref 3.5–5.3)
Protein, Total: 7.7 g/dL (ref 6.0–8.3)
Protein, Total: 6.8 g/dL (ref 6.0–8.3)
Sodium: 141 mEq/L (ref 135–145)
Sodium: 139 mEq/L (ref 135–145)
eGFR: >60 mL/min/{1.73_m2} (ref >=60)
eGFR: >60 mL/min/{1.73_m2} (ref >=60)

## 2023-03-10 LAB — CBC AND DIFFERENTIAL
Absolute NRBC: 0 10*3/uL (ref 0.00–0.00)
Absolute NRBC: 0 10*3/uL (ref 0.00–0.00)
Basophils Absolute Automated: 0.09 10*3/uL — ABNORMAL HIGH (ref 0.00–0.08)
Basophils Absolute Automated: 0.06 10*3/uL (ref 0.00–0.08)
Basophils Automated: 0.8 %
Basophils Automated: 0.5 %
Eosinophils Absolute Automated: 0.17 10*3/uL (ref 0.00–0.44)
Eosinophils Absolute Automated: 0.15 10*3/uL (ref 0.00–0.44)
Eosinophils Automated: 1.6 %
Eosinophils Automated: 1.2 %
Hematocrit: 48.9 % (ref 37.6–49.6)
Hematocrit: 44.8 % (ref 37.6–49.6)
Hgb: 17.1 g/dL (ref 12.5–17.1)
Hgb: 15.6 g/dL (ref 12.5–17.1)
Immature Granulocytes Absolute: 0.03 10*3/uL (ref 0.00–0.07)
Immature Granulocytes Absolute: 0.05 10*3/uL (ref 0.00–0.07)
Immature Granulocytes: 0.3 %
Immature Granulocytes: 0.4 %
Instrument Absolute Neutrophil Count: 6.67 10*3/uL — ABNORMAL HIGH (ref 1.10–6.33)
Instrument Absolute Neutrophil Count: 8.23 10*3/uL — ABNORMAL HIGH (ref 1.10–6.33)
Lymphocytes Absolute Automated: 3.33 10*3/uL — ABNORMAL HIGH (ref 0.42–3.22)
Lymphocytes Absolute Automated: 2.91 10*3/uL (ref 0.42–3.22)
Lymphocytes Automated: 30.5 %
Lymphocytes Automated: 23.9 %
MCH: 27.7 pg (ref 25.1–33.5)
MCH: 27.2 pg (ref 25.1–33.5)
MCHC: 35 g/dL (ref 31.5–35.8)
MCHC: 34.8 g/dL (ref 31.5–35.8)
MCV: 79.1 fL (ref 78.0–96.0)
MCV: 78 fL (ref 78.0–96.0)
MPV: 9.6 fL (ref 8.9–12.5)
MPV: 9.3 fL (ref 8.9–12.5)
Monocytes Absolute Automated: 0.62 10*3/uL (ref 0.21–0.85)
Monocytes Absolute Automated: 0.8 10*3/uL (ref 0.21–0.85)
Monocytes: 5.7 %
Monocytes: 6.6 %
Neutrophils Absolute: 6.67 10*3/uL — ABNORMAL HIGH (ref 1.10–6.33)
Neutrophils Absolute: 8.23 10*3/uL — ABNORMAL HIGH (ref 1.10–6.33)
Neutrophils: 61.1 %
Neutrophils: 67.4 %
Nucleated RBC: 0 /100 WBC (ref 0.0–0.0)
Nucleated RBC: 0 /100 WBC (ref 0.0–0.0)
Platelets: 469 10*3/uL — ABNORMAL HIGH (ref 142–346)
Platelets: 411 10*3/uL — ABNORMAL HIGH (ref 142–346)
RBC: 6.18 10*6/uL — ABNORMAL HIGH (ref 4.20–5.90)
RBC: 5.74 10*6/uL (ref 4.20–5.90)
RDW: 12 % (ref 11–15)
RDW: 12 % (ref 11–15)
WBC: 10.91 10*3/uL — ABNORMAL HIGH (ref 3.10–9.50)
WBC: 12.2 10*3/uL — ABNORMAL HIGH (ref 3.10–9.50)

## 2023-03-10 LAB — SEDIMENTATION RATE: Sed Rate: 7 mm/Hr (ref 0–15)

## 2023-03-10 LAB — PT/INR
PT INR: 1 (ref 0.9–1.1)
PT: 12.1 s (ref 10.1–12.9)

## 2023-03-10 LAB — HIGH SENSITIVITY TROPONIN-I WITH DELTA
hs Troponin-I Delta: UNDETERMINED ng/L
hs Troponin-I: <2.7 ng/L

## 2023-03-10 LAB — URINE DRUGS OF ABUSE SCREEN
Barbiturate Screen, UR: NEGATIVE
Benzodiazepine Screen, UR: NEGATIVE
Cannabinoid Screen, UR: NEGATIVE
Cocaine, UR: NEGATIVE
Opiate Screen, UR: NEGATIVE
PCP Screen, UR: NEGATIVE
Urine Amphetamine Screen: NEGATIVE
Urine Fentanyl: NEGATIVE

## 2023-03-10 LAB — BLOOD TYPE CONFIRMATION: Blood Type Confirmation: B POS

## 2023-03-10 LAB — APTT: PTT: 34 s (ref 27–39)

## 2023-03-10 LAB — HIGH SENSITIVITY TROPONIN-I: hs Troponin-I: <2.7 ng/L

## 2023-03-10 LAB — WHOLE BLOOD GLUCOSE POCT: Whole Blood Glucose POCT: 93 mg/dL (ref 70–100)

## 2023-03-10 LAB — TYPE AND SCREEN
AB Screen Gel: NEGATIVE
ABO Rh: B POS

## 2023-03-10 LAB — C-REACTIVE PROTEIN: C-Reactive Protein: 0.1 mg/dL (ref 0.0–1.1)

## 2023-03-10 MED ORDER — ATORVASTATIN CALCIUM 40 MG PO TABS
40.0000 mg | ORAL_TABLET | Freq: Every evening | ORAL | Status: DC
Start: 2023-03-10 — End: 2023-03-11
  Administered 2023-03-10: 40 mg via ORAL
  Filled 2023-03-10: qty 1

## 2023-03-10 MED ORDER — LABETALOL HCL 5 MG/ML IV SOLN (WRAP)
10.0000 mg | INTRAVENOUS | Status: DC | PRN
Start: 2023-03-10 — End: 2023-03-17

## 2023-03-10 MED ORDER — DEXMEDETOMIDINE HCL IN NACL 400 MCG/100ML IV SOLN
0.0000 ug/kg/h | INTRAVENOUS | Status: DC
Start: 2023-03-10 — End: 2023-03-11
  Administered 2023-03-10: 0.2 ug/kg/h via INTRAVENOUS
  Filled 2023-03-10: qty 100

## 2023-03-10 MED ORDER — MAGNESIUM SULFATE IN D5W 1-5 GM/100ML-% IV SOLN
1.0000 g | INTRAVENOUS | Status: DC | PRN
Start: 2023-03-10 — End: 2023-03-17

## 2023-03-10 MED ORDER — HYDRALAZINE HCL 20 MG/ML IJ SOLN
10.0000 mg | INTRAMUSCULAR | Status: DC | PRN
Start: 2023-03-10 — End: 2023-03-17

## 2023-03-10 MED ORDER — SODIUM PHOSPHATES 3 MMOLE/ML IV SOLN (WRAP)
25.0000 mmol | INTRAVENOUS | Status: DC | PRN
Start: 2023-03-10 — End: 2023-03-17

## 2023-03-10 MED ORDER — LABETALOL HCL 5 MG/ML IV SOLN (WRAP)
10.0000 mg | Freq: Once | INTRAVENOUS | Status: DC | PRN
Start: 2023-03-10 — End: 2023-03-10

## 2023-03-10 MED ORDER — AMPHETAMINE-DEXTROAMPHETAMINE 5 MG PO TABS
10.0000 mg | ORAL_TABLET | Freq: Two times a day (BID) | ORAL | Status: DC
Start: 2023-03-11 — End: 2023-03-17
  Administered 2023-03-12 – 2023-03-17 (×8): 10 mg via ORAL
  Filled 2023-03-10 (×11): qty 2

## 2023-03-10 MED ORDER — SENNOSIDES-DOCUSATE SODIUM 8.6-50 MG PO TABS
1.0000 | ORAL_TABLET | Freq: Every evening | ORAL | Status: DC
Start: 2023-03-10 — End: 2023-03-17
  Administered 2023-03-14 – 2023-03-15 (×2): 1 via ORAL
  Filled 2023-03-10 (×6): qty 1

## 2023-03-10 MED ORDER — TENECTEPLASE IV PUSH (STROKE - ED/ECC)
25.0000 mg | PACK | Freq: Once | INTRAVENOUS | Status: AC
Start: 2023-03-10 — End: 2023-03-10
  Administered 2023-03-10: 25 mg via INTRAVENOUS
  Filled 2023-03-10: qty 10

## 2023-03-10 MED ORDER — SODIUM CHLORIDE 0.9 % IV MBP
0.0000 mg/h | INTRAVENOUS | Status: DC | PRN
Start: 2023-03-10 — End: 2023-03-10

## 2023-03-10 MED ORDER — POTASSIUM CHLORIDE 20 MEQ PO PACK
0.0000 meq | PACK | ORAL | Status: DC | PRN
Start: 2023-03-10 — End: 2023-03-17

## 2023-03-10 MED ORDER — SODIUM CHLORIDE 0.9 % IV MBP
0.0000 mg/h | INTRAVENOUS | Status: DC | PRN
Start: 2023-03-10 — End: 2023-03-15

## 2023-03-10 MED ORDER — SODIUM PHOSPHATES 3 MMOLE/ML IV SOLN (WRAP)
15.0000 mmol | INTRAVENOUS | Status: DC | PRN
Start: 2023-03-10 — End: 2023-03-17

## 2023-03-10 MED ORDER — GADOTERATE MEGLUMINE 10 MMOL/20ML IV SOLN (CLARISCAN)
20.0000 mL | Freq: Once | INTRAVENOUS | Status: AC | PRN
Start: 2023-03-10 — End: 2023-03-10
  Administered 2023-03-10: 20 mL via INTRAVENOUS

## 2023-03-10 MED ORDER — CALCIUM GLUCONATE-NACL 1-0.675 GM/50ML-% IV SOLN
1.0000 g | INTRAVENOUS | Status: DC | PRN
Start: 2023-03-10 — End: 2023-03-17

## 2023-03-10 MED ORDER — SODIUM PHOSPHATES 3 MMOLE/ML IV SOLN (WRAP)
35.0000 mmol | INTRAVENOUS | Status: DC | PRN
Start: 2023-03-10 — End: 2023-03-17

## 2023-03-10 MED ORDER — POTASSIUM CHLORIDE CRYS ER 20 MEQ PO TBCR
0.0000 meq | EXTENDED_RELEASE_TABLET | ORAL | Status: DC | PRN
Start: 2023-03-10 — End: 2023-03-17

## 2023-03-10 MED ORDER — MAGNESIUM OXIDE 400 MG TABS (WRAP)
400.0000 mg | ORAL_TABLET | ORAL | Status: DC | PRN
Start: 2023-03-10 — End: 2023-03-17

## 2023-03-10 MED ORDER — LITHIUM CARBONATE ER 300 MG PO TBCR
300.0000 mg | EXTENDED_RELEASE_TABLET | Freq: Three times a day (TID) | ORAL | Status: DC
Start: 2023-03-10 — End: 2023-03-17
  Administered 2023-03-11 – 2023-03-17 (×19): 300 mg via ORAL
  Filled 2023-03-10 (×27): qty 1

## 2023-03-10 MED ORDER — POTASSIUM CHLORIDE 10 MEQ/100ML IV SOLN (WRAP)
10.0000 meq | INTRAVENOUS | Status: DC | PRN
Start: 2023-03-10 — End: 2023-03-17

## 2023-03-10 MED ORDER — IOHEXOL 350 MG/ML IV SOLN
100.0000 mL | Freq: Once | INTRAVENOUS | Status: AC | PRN
Start: 2023-03-10 — End: 2023-03-10
  Administered 2023-03-10: 100 mL via INTRAVENOUS

## 2023-03-10 NOTE — ED Notes (Signed)
Bed: BL20  Expected date:   Expected time:   Means of arrival:   Comments:  CN HOLD

## 2023-03-10 NOTE — Progress Notes (Signed)
FOUR EYES SKIN ASSESSMENT NOTE    Kurt Rogers  [birth year]  [id number]  Braden Scale Score: 18            ICU ONLY: Was HAPI Intervention list reviewed with incoming RN/PCT? Yes    Bony Prominences: Check appropriate box; if wound is present enter wound assessment in LDA     Occiput:                 [x] WNL  []  Wound present  Face:                     [x] WNL  []  Wound present  Ears:                      [x] WNL  []  Wound present  Spine:                    [x] WNL  []  Wound present  Shoulders:             [x] WNL  []  Wound present  Elbows:                  [x] WNL  []  Wound present  Sacrum/coccyx:     [x] WNL  []  Wound present  Ischial Tuberosity:  [x] WNL  []  Wound present  Trochanter/Hip:      [x] WNL  []  Wound present  Knees:                   [] WNL  [x]  Wound present Surgical wound, photograph in chart. Dr Bonney Leitz also assessed.   Ankles:                   [x] WNL  []  Wound present  Heels:                    [x] WNL  []  Wound present  Other pressure areas:  []  Wound location       Device related: []  Device name:             If Wound/Pressure Injury present: None    Julien Girt, RN  March 10, 2023  7:55 PM    Second RN/PCT Name:    Wound 02/17/23 Surgical Incision Leg dermabond (Active)

## 2023-03-10 NOTE — ED Triage Notes (Signed)
[facility] EMERGENCY DEPARTMENT  Provider in Triage Note        Patient Name: Kurt Rogers    Chief Complaint:   Chief Complaint   Patient presents with    Tremors    Extremity Weakness       HPI: Kurt Rogers is a 32 y.o. male, who has had a rapid medical screening evaluation initiated by myself. LUE weakness,tremors to L arm starting approx 1230pm. No visual changes. Hx of ?TIA previous, had change in mentation, MRI previously. No headache. No blood thinners.    Medical/Surgical/Social history: as per HPI    Vitals: BP (!) 173/104   Pulse (!) 110   Temp 98 F (36.7 C) (Temporal)   Resp 20   Ht 5\' 10"  (1.778 m)   Wt 120.2 kg   SpO2 99%   BMI 38.02 kg/m     Pertinent brief exam:   Examination of area of concern: dec sensation to L face, L sided grip weakness compared to R, L pronator drift    Preliminary orders: ct head, labs, stroke call     Symptom based preliminary diagnosis/MDM: L arm weakness, tremors    Patient advised to remain in the ED until further evaluation can be performed. Patient instructed to notify staff of any changes in condition while waiting.  This assessment is an initial evaluation to expedite care.

## 2023-03-10 NOTE — OT Eval Note (Signed)
[facility]   Occupational Therapy Attempt Note    Patient:  Hendrixx Severin MRN#:  [id number]  Unit:  [facility] EMERGENCY DEPARTMENT Room/Bed:  BL20/BL20      Occupational Therapy evaluation attempted on 03/10/2023 at 3:21 PM.    OT Visit Cancellation Reason: Not clinically indicated at this time (comment required) - Rehab decision (Pt with orders to admit to inpatient. OT/PT will hold unti pt is on unit)          Physical and/or occupational therapy will follow up at a later time/date as able.       Sheppard Coil [place], [place]  [id number]  Physical Medicine and Rehabilitation  [facility]    03/10/2023  3:21 PM

## 2023-03-10 NOTE — Consults (Addendum)
NEUROLOGY STROKE EVALUATION    Date Time: 03/10/23 2:04 PM  Patient Name: Kurt Rogers  Attending Physician: Simonne Maffucci, DO        Rapid History:   CODE STROKE was called on this 32 yo male w ADHD, depression, anxiety, recent L anterior tibialis fascial repair, who reports being in his usual state of health until 12:30pm today when he began noticing left sided weakness and numbness. Also feels LUE has tremors - especially in the hand.  Of note - he has had left leg weakness in the past - but says the last such episode was 1 year ago - nothing recent. He denies headaches.  Denies new medications.      LAST KNOWN NORMAL: 12:30pm      Physical Exam:   Blood pressure 144/83, pulse (!) 110, temperature 98 F (36.7 C), temperature source Temporal, resp. rate 20, height 1.778 m (5\' 10" ), weight 120.2 kg (265 lb), SpO2 99%.  Pt is alert  Mild L hand tremor  + drift LUE  Face is symmetric  Although no drift LLE on testing, he does move it less readily during testing compared with RLE    NIHSS: 2      Decision Making     IV-THROMBOLYTICS EVALUATION:    Thrombolytic exclusions reviewed:  Time greater than 4.5 hours, bleeding on scan, stroke in past 3 months, Hx ICH, brain neoplasm, AVM/aneurysm, active internal bleeding, platelets less than 100, SBP >185/110.    Based on the above Pt is appropriate for IV-thrombolytic.    IA-THROMBECTOMY:    Inclusions:  NIHSS >5 OR significant aphasia OR severe leg weakness with evidence of LVO and/or perfusion deficit.      Exclusions:  mRS >2 (3 and greater requires assistance for at least some activities of daily living)    Based on the above discussion - referral for IV-thrombectomy s not appropriate given low NIHSS      Assessment/Plan:   Left sided weakness (mild) and numbness and tremors.  Etiology for symptoms is not clear but given focality cannot exclude small vessel ischemic stroke. This seems more likely than demyelinating event given rapidity of onset. Unclear why  he is having a tremor as well.    -  Pt offered and accepted getting IV-TNK after discussing risks and benefits  -  Head CT appears negative  -  Check tox screen given new tremors  -  Will need ICU placement after TNK  -  neuro checks per post-TNK protocol  -  MRI brain and MRA head/neck and echocardiogram  -  will follow up    Past Medical History:     Past Medical History:   Diagnosis Date    Abnormal vision     glasses    Anxiety     Attention deficit hyperactivity disorder (ADHD)     Depression     Difficulty walking     uses cane at times    Generalized weakness 2014    in left leg since 2014    Mitral valve prolapse     Neurological abnormality     Non-rheumatic mitral valve disease     Numbness     TIA (transient ischemic attack)     in 2021 per PT       Meds:      Scheduled Meds: PRN Meds:         Continuous Infusions:   niCARdipine      iohexol, 100 mL, ONCE PRN  labetalol, 10 mg, Once PRN  niCARdipine, 0-15 mg/hr, Continuous PRN          I personally reviewed all of the medications.  Medication list generated using all available resources.    Elder abuse (physical)  - negative  Advanced care plan - reviewed from chart or in discussion with pt or family    Allergies   Allergen Reactions    Shellfish-Derived Products Anaphylaxis    Latex Rash     And swelling       Social & Family History:     Social History     Socioeconomic History    Marital status: Single   Tobacco Use    Smoking status: Never     Passive exposure: Never    Smokeless tobacco: Never   Vaping Use    Vaping status: Never Used   Substance and Sexual Activity    Alcohol use: Yes     Comment: once monthly per PT    Drug use: No     Comment: marijuana 1-2x a month (edibles)     Social Determinants of Health     Food Insecurity: No Food Insecurity (03/10/2023)    Hunger Vital Sign     Worried About Running Out of Food in the Last Year: Never true     Ran Out of Food in the Last Year: Never true   Transportation Needs: No Transportation Needs  (02/19/2023)    PRAPARE - Therapist, art (Medical): No     Lack of Transportation (Non-Medical): No   Physical Activity: Unknown (02/19/2023)    Exercise Vital Sign     Days of Exercise per Week: Patient declined   Intimate Partner Violence: Not At Risk (03/10/2023)    Humiliation, Afraid, Rape, and Kick questionnaire     Fear of Current or Ex-Partner: No     Emotionally Abused: No     Physically Abused: No     Sexually Abused: No   Housing Stability: Unknown (02/19/2023)    Housing Stability Vital Sign     Unable to Pay for Housing in the Last Year: No     Unstable Housing in the Last Year: No       Family History   Problem Relation Age of Onset    Malignant hyperthermia Neg Hx     Anesthesia problems Neg Hx              Signed by: Ardelle Anton, MD      60 minutes spent with patient and examination of relevant data. This includes review of all CT/CTA/CTP and available MRI images as well as review when necessary with Ed physician, radiologist, interventional radiologist.    Spectralink: [phone]       Answering Service: [phone]

## 2023-03-10 NOTE — SLP Eval Note (Signed)
[facility]  Speech and Language Therapy Bedside Swallow Evaluation     Patient:  Kurt Rogers MRN#:  [id number]  Unit:   [place] CARDIOVASCULAR & NEURO INTENSIVE CARE Room/Bed:  A2710/A2710-01    Time of Treatment:   Time Calculation  SLP Received On: 03/10/23  Start Time: 1620  Stop Time: 1637  Time Calculation (min): 17 min    SLP Consult received and orders acknowledged for Kurt Rogers for SLP Bedside Swallow Evaluation and Treatment.    Medical Diagnosis: Left-sided weakness [R53.1]    History of Present Illness: Kurt Rogers is a 32 y.o. male admitted on 03/10/2023 with a rapid medical screening evaluation initiated by myself. LUE weakness,tremors to L arm starting approx 1230pm. No visual changes. Hx of ?TIA previous, had change in mentation, MRI previously. No headache. No blood thinners.     CT Angiogram Cerebral Perfusion:  1.rCBF<30% images show 0 cc. Tmax>6sec images show 0 cc.  2.The intracranial arteries are patent, no proximal intracranial large  vessel occlusion.  3.There is no stenosis of the proximal right internal carotid artery based  on NASCET criteria.  4.There is no stenosis of the proximal left internal carotid artery based  on NASCET criteria.      Patient Active Problem List   Diagnosis    Left-sided weakness    Mitral valve disease        Past Medical/Surgical History:  Past Medical History:   Diagnosis Date    Abnormal vision     glasses    Anxiety     Attention deficit hyperactivity disorder (ADHD)     Depression     Difficulty walking     uses cane at times    Generalized weakness 2014    in left leg since 2014    Mitral valve prolapse     Neurological abnormality     Non-rheumatic mitral valve disease     Numbness     TIA (transient ischemic attack)     in 2021 per PT      Past Surgical History:   Procedure Laterality Date    RECONSTRUCTION, FACIAL  2014    left side near eye brow    RECONSTRUCTION, MYOCUTANEOUS FLAP  02/17/2023     Procedure: RECONSTRUCTION, FASCIOCUTANEOUS FLAP, LEFT LEG;  Surgeon: Lorene Dy, DO;  Location: [facility];  Service: General;;    RELEASE, LOWER EXTREMITY CONTRACTURE  02/17/2023    Procedure: RELEASE, LOWER EXTREMITY CONTRACTURE;  Surgeon: Lorene Dy, DO;  Location: [facility];  Service: General;;    REPAIR, FASCIAL DEFECT Left 02/17/2023    Procedure: REPAIR, FASCIAL DEFECT LEFT LEG;  Surgeon: Lorene Dy, DO;  Location: [facility];  Service: General;  Laterality: Left;    TONSILLECTOMY      as child         History/Current Status:  Current Status  Behavior/Mental Status: Awake/alert, Able to follow directions, Cooperative, Pleasant mood  Nutrition: NPO    Subjective: Patient is agreeable to participation in the therapy session. Nursing clears patient for therapy. Patient's medical condition is appropriate for Speech therapy intervention at this time.  Pt reports feeling "funny" with difficulty "getting the words out".  Pt A&Ox3 (reported current month as "May") with good command-following, no confusion noted.    Objective:  Observation of Patient/Vital Signs:  Patient is in bed with telemetry in place.    Oral Motor Skills:  Engineer, maintenance (IT) Skills:  within functional limits    Deglutition Skills:  Deglutition Skills  Position: upright 90 degrees  Food(s) Tested: Ice chips, Thin Liquids (IDDSI TN0), Via cup, Via straw, Pureed (IDDSI P4)/Pureed, Regular (IDDSI RG7)  Oral Stage: adequate       Oral Stage Residuals:  (none)  Pharyngeal Stage: adequate  Esophageal Stage: history of esophageal diagnosis, appears WFL (reports min reflux premorbidly, relieved with TUMS)    Informal communication assessment:  Speech/lang/cog/voice WFL with minimally effortful speech towards end of eval but with appropriate word choice and sentence structure, with no instances of word-finding difficulty.      Assessment:  Pt with intact oropharyngeal swallow pattern without anterior spillage, retention of  residuals, timely swallow trigger, appropriate laryngeal elevation via palpation and without overt s/s of aspiration.  Pt is appropriate to return to premorbid p.o. texture at highest diet level.    Goals:  Pt will tolerate IDDSI Regular RG 7  and Thin TN0 without s/s aspiration or penetration x 48 hours           Plan/Recommendations:  Secure chat message placed to MD for orders for speech/lang/cog eval d/t pt's self-report of acute changes in language function.  Speech therapy eval/tx pending results of that eval once completed.           Diet Solids Recommendation: Regular (IDDSI RG7)  Diet Liquids Recommendations: Thin Liquids (IDDSI TN0)  Precautions/Compensations: Upright 90 degrees for all oral intake, Alternate solids and liquids  Recommendation Discussed With: : Patient, Physician, Nurse  SLP Frequency Recommended: other (comment) (f/u visit with speech therapy POC if indicated) with swallow f/u x1  Administration of Medications:   Aspiration Precautions posted at bedside: RN handoff completed with comprehension verbalized re swallow eval recs and results by pt and RN.  Communicated diet recommendations to RN, who authorized SLP to enter diet orders into Epic per hospital protocol.          03/10/2023  Gerri Lins, MS, CCC-SLP  [phone]

## 2023-03-10 NOTE — H&P (Signed)
[facility]  History and Physical      Patient Name: Kurt Rogers  MRN: [id number]  Room: BL20/BL20  Code Status: Full Code    Chief Complaint / Primary Reason for MSICU Evaluation   Stroke symptoms s/p TNK    History of Presenting Illness   Kurt Rogers is a 32 y.o. male with h/o ADHD, anxiety, depression, bipolar disorder, recent surgical repair of left anterior tibialis, LKW 1230 today when he developed acute onset of LUE tremor while at work, along with left facial, LUE and LLE numbness, and left-sided weakness.  Patient reportedly had a similar episode about a year ago.  Brought into ED, tachycardic with HR 110, BP 144/83, all other vitals normal, GCS = 15, initial NIHSS initially reported as 2.  CTH showed NAD.  Neurology was consulted and patient received TNK at 1357 this afternoon.  [facility] was consulted for admission to [facility].  On my evaluation in ED, RN reported initial NIHSS was in fact 7, currently measuring at 6.  Patient denied HA, dizziness, lightheadedness, chest pain, dyspnea, nausea, vomiting, or visual changes.  Case discussed with neurologist in light of NIH higher than initially understood and additional neuroimaging was ordered.  CTA head/neck was negative for LVO and CTP was unremarkable .    Subjective   Past Medical History:     Past Medical History:   Diagnosis Date    Abnormal vision     glasses    Anxiety     Attention deficit hyperactivity disorder (ADHD)     Depression     Difficulty walking     uses cane at times    Generalized weakness 2014    in left leg since 2014    Mitral valve prolapse     Neurological abnormality     Non-rheumatic mitral valve disease     Numbness     TIA (transient ischemic attack)     in 2021 per PT       Past Surgical History:     Past Surgical History:   Procedure Laterality Date    RECONSTRUCTION, FACIAL  2014    left side near eye brow    RECONSTRUCTION, MYOCUTANEOUS FLAP  02/17/2023    Procedure: RECONSTRUCTION,  FASCIOCUTANEOUS FLAP, LEFT LEG;  Surgeon: Lorene Dy, DO;  Location: [facility];  Service: General;;    RELEASE, LOWER EXTREMITY CONTRACTURE  02/17/2023    Procedure: RELEASE, LOWER EXTREMITY CONTRACTURE;  Surgeon: Lorene Dy, DO;  Location: [facility];  Service: General;;    REPAIR, FASCIAL DEFECT Left 02/17/2023    Procedure: REPAIR, FASCIAL DEFECT LEFT LEG;  Surgeon: Lorene Dy, DO;  Location: [facility];  Service: General;  Laterality: Left;    TONSILLECTOMY      as child       Family History:     Family History   Problem Relation Age of Onset    Malignant hyperthermia Neg Hx     Anesthesia problems Neg Hx        Social History:     Social History     Socioeconomic History    Marital status: Single     Spouse name: Not on file    Number of children: Not on file    Years of education: Not on file    Highest education level: Not on file   Occupational History    Not on file   Tobacco Use    Smoking  status: Never     Passive exposure: Never    Smokeless tobacco: Never   Vaping Use    Vaping status: Never Used   Substance and Sexual Activity    Alcohol use: Yes     Comment: once monthly per PT    Drug use: No     Comment: marijuana 1-2x a month (edibles)    Sexual activity: Not on file   Other Topics Concern    Not on file   Social History Narrative    Not on file     Social Determinants of Health     Financial Resource Strain: Not on file   Food Insecurity: No Food Insecurity (03/10/2023)    Hunger Vital Sign     Worried About Running Out of Food in the Last Year: Never true     Ran Out of Food in the Last Year: Never true   Transportation Needs: No Transportation Needs (02/19/2023)    PRAPARE - Therapist, art (Medical): No     Lack of Transportation (Non-Medical): No   Physical Activity: Unknown (02/19/2023)    Exercise Vital Sign     Days of Exercise per Week: Patient declined     Minutes of Exercise per Session: Not on file   Stress: Not on file   Social Connections: Not on  file   Intimate Partner Violence: Not At Risk (03/10/2023)    Humiliation, Afraid, Rape, and Kick questionnaire     Fear of Current or Ex-Partner: No     Emotionally Abused: No     Physically Abused: No     Sexually Abused: No   Housing Stability: Unknown (02/19/2023)    Housing Stability Vital Sign     Unable to Pay for Housing in the Last Year: No     Number of Places Lived in the Last Year: Not on file     Unstable Housing in the Last Year: No       Allergies:     Allergies   Allergen Reactions    Shellfish-Derived Products Anaphylaxis    Latex Rash     And swelling          Objective   Physical Examination   Vitals Temp:  [98 F (36.7 C)-98.1 F (36.7 C)]   Heart Rate:  [87-110]   Resp Rate:  [16-20]   BP: (124-173)/(79-104)   SpO2:  [95 %-99 %]   Height:  [177.8 cm (5\' 10" )]   Weight:  [120.2 kg (265 lb)]   BMI (calculated):  [38]  // Temperature with 24 range  Vent Settings      General: Male patient in no acute distress    Neuro:    -Mental status: A&O to person, place and year, incorrectly states the month as "May", GCS = 14, speech clear, fluent and linear  -Cranial nerves:   - II:  PERRLA, left-sided impairment in lower medial visual field, visual acuity grossly intact   - III/IV/VI:  EOMI, no gaze preference or deviation, left-sided nystagmus noted   - V:  Absent light touch sensation in V1 segment on the left, diminished V2 and V3 segment sensation on the left.  Right-sided facial sensation intact in segments V1 through V3.   - VII:  No facial asymmetry or nasolabial flattening   - VIII:  Hearing intact and equal bilaterally   - XI/X:  Normal palatal elevation, uvula midline   - XI:  Full rotational head strength  and shoulder shrug bilaterally   - XII:  Tongue protrudes midline  - Motor:  Normal tone throughout.  LUE bicep 4/5, LLE hip flexor 4/5, left foot drop with left dorsiflexion 2/5, left plantarflexion 3/5.  LUE pronator drift noted.  Full strength in right left upper and lower extremities with no  right-sided pronator drift.  -Sensory: Reduced light touch sensation in the left upper extremity from the shoulder to the hands on both ventral and dorsal surfaces of the arm.  Diminished left lower extremity sensation to light touch.  Right-sided light touch sensation intact.  -Coordination: Sluggish but intact finger-to-nose on the left, fully intact on the right.  Heel-to-shin significantly impaired on the left, intact on the right.  -Reflexes: Hyporeflexic in the bilateral patellars, no ankle clonus    Lungs:   clear to auscultation, no wheezes, rales or rhonchi, symmetric air entry, no tachypnea, retractions or cyanosis     Cardiac:   normal rate, regular rhythm, normal S1, S2, no murmurs, rubs, clicks or gallops     Abdomen:    soft, nontender, nondistended, no masses or organomegaly  bowel sounds normal     Extremities:   peripheral pulses normal no pedal edema no clubbing or cyanosis no ulcers, gangrene or atrophic changes    Skin:     Cool and dry    Assessment   Kurt Rogers is a 32 y.o. male with history of ADHD, anxiety and depression, reported prior episode of left-sided weakness about a year ago, now presenting with acute onset of left sided weakness and hemisensory loss with initial NIHSS of 7, last known well 1230 on 03/10/2023, status post TNK at 1357 for suspected ischemic stroke, now admitted to [facility] ICU for further care and neuromonitoring.      Patient has BMI=Body mass index is 38.02 kg/m.  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     Cardiovascular   Cardiac High Impact Dx: None (can delete this section)    Pulmonary  Pulm High Impact Dx: none    Renal  Renal High Impact Dx:     Infections Disease  Infectious Disease High Impact Dx: None    Hematology  Heme High Impact Dx: None (you can delete this heading)    Endo/Rheum  Endo High Impact: None    ICU Checklist  Sedation:   CAM-ICU:     CAM ICU:   Last  Documented RASS:     Currently ordered infusions:    niCARdipine      Reviewed: Yes   Mobility:   Current Mobility Level:    Current PT Order: Yes  Current OT Order: Yes Reviewed: Yes   Respiratory (n/a if blank):   Ventilator Time:    Last Recorded Vent Mode:    Reviewed: Yes   Gastrointenstinal  Last Bowel Movement:   No data recorded Reviewed: Yes   CAUTI Prevention (n/a if blank):  Foley Day:        Reviewed: Yes   Blood Steam Infection Prevention (n/a if blank):    Reviewed: Yes   DVT Chemoprophylaxis (none if blank):    Reviewed: Yes            Plan     NEUROLOGICAL:   #Stroke symptoms s/p thrombolysis, initial NIHSS = 7  #History of anxiety/depression  #History of bipolar disorder  #History of  attention deficit hyperactivity disorder  #Possibly history of multiple sclerosis vs. conversion disorder  #History of left frontal T2 FLAIR hyperintensities on prior MRI  -Admit to [facility] ICU for post thrombolysis stroke protocol  -CTH, CTA head/neck and CTP negative  -Hourly neuro checks, STAT head CT for acute neurologic change  -SBP goal 120-180   -Repeat brain imaging at 24 hours post TNK  -MRI brain w/o contrast and MRA head/neck ordered  -Stroke workup: FLP, HbA1c, TSH, urine drug screen, TTE with bubble study  -Start high-intensity statin  -No anticoagulants or antiplatelet agents for first 24 hours  -Neurology consulted, discussed with Dr. Moise Boring, input appreciated  -Avoid benzodiazepines and sedatives  -Tylenol as needed for pain/fever  -Med reconciliation completed with assistance by pharmacist.  Will continue home lithium and Adderall.  Holding BuSpar and nightly Seroquel for now.  -Passed SLP, noted some word finding difficulties.  SLP cognitive/language evaluation ordered per their recommendation.  -Hypercoagulable workup once out of TNK window  -Check ESR and CRP to screen for vasculitis  -PT/OT, OOB to chair as indicated/tolerated  -Delirium precautions including maintenance of day/night wake cycles,  frequent reorientation as needed  -Etiology of stroke is unclear in this young man with few risk factors.  Some family history of heart attack and MS, no strokes.  -Differential also includes demyelinating disease.  Per chart review, patient has had prior similar episodes of acute left-sided weakness, most recently July 2023 and prior episodes dating back to February 2022.  He has reportedly undergone extensive neurologic workup by neurologist Dr. Lanette Hampshire at [facility], including MRI brain February 2022 showing left frontal signal abnormalities, as well as LP with CSF studies reportedly negative for MS.  -Outside neurologist raised questions of MS versus conversion disorder    CARDIOVASCULAR:   #Reported history of non-rheumatic mitral valve disease  -Permissive hypertensive as detailed above  -Blood Pressure Goal: SBP 120-180  -Hydralazine and labetalol IV prn.  Nicardipine gtt if necessary.  -Troponin negative, no chest pain or clinical s/s of ACS.  -ECG with NSR, normal axis, S wave in lead I, Q wave and inverted T-wave in Lead 3 which were present in prior tracings dating back to August 2021.  Nonspecific ST changes with no evidence to suggest ischemia.  -TTE with bubble ordered for stroke workup     PULMONARY:   No issues, oxygenating well on room air  Supplemental O2 as needed for goal SpO2 >92%    RENAL:   No acute issues, electrolytes benign  Daily BMP, replace lytes per protocol  Monitor UOP    GASTROINTESTINAL:  #At risk for constipation  Nutrition: N.p.o. pending bedside nursing swallow assessment  Bowel Regimen with nightly Peri-Colace  GI ppx: Not indicated    INFECTIOUS DISEASE:   No acute infectious concerns  Check COVID swab as COVID-19 is known to be associated with thrombotic events.  Of note patient was positive for COVID-19 at the end of 2023 per chart review    HEMATOLOGY/ONCOLOGY:   No acute issues  - VTE ppx: Hold pharmacologic DVT ppx pending repeat neuroimaging at 24  hours    ENDOCRONOLOGY/RHEUMATOLOGY:   No acute issues  Glucose goal 100-180    FAMILY UPDATE:   Patient's father Sajan Cheatwood was updated, all questions answered.    This patient is critically ill with life-threatening condition(s) and a high probability of sudden clinically significant deterioration due to the condition(s) noted in the assessment and plan, which requires the highest level of physician/advance  practice provider preparedness to intervene urgently. Full attention to the direct care of this patient was provided for the period of time noted below. Any critical care time performed today is exclusive of teaching and billable procedures and not overlapping with any other physicians or advance practice providers.    I have personally assessed the patient and based my assessment and medical decision-making on a review of the patient's history and 24-hour interval events along with medical records, physical examination, vital signs, analysis of recent laboratory results, evaluation of radiology images, monitoring data for potential decompensation, and additional findings found in detail within ICU team notes. The findings and plan of care was discussed with the care team.    Total critical care time: 120 minutes during this encounter.    Janora Norlander, MD   03/10/2023 3:16 PM

## 2023-03-10 NOTE — Plan of Care (Signed)
Problem: Day of Admission - Stroke  Goal: Core/Quality measure requirements - Admission  Outcome: Progressing  Flowsheets (Taken 03/10/2023 2005)  Core/Quality measure requirements - Admission:   Document NIH Stroke Scale on admission   Document nursing swallow/dysphagia screen on admission. If patient fails, keep patient NPO (follow your hospital protocol on swallowing screening).   VTE Prevention: Ensure anticoagulant(s) administered and/or anti-embolism stockings/devices documented as ordered   Ensure antithrombotic administered or contraindication documented by LIP   Ensure PT/OT and/or SLP ordered   Ensure lipid panel ordered   Begin stroke education on admission (must include Modifiable Risk Factors, Warning Signs and Symptoms of Stroke, Activation of Emergency Medical System and Follow-up Appointments) Ensure handout has been given and documented.     Problem: Every Day - Stroke  Goal: Core/Quality measure requirements - Daily  Outcome: Progressing  Flowsheets (Taken 03/10/2023 2005)  Core/Quality measure requirements - Daily:   VTE Prevention: Ensure anticoagulant(s) administered and/or anti-embolism stockings/devices documented by end of day 2   Ensure antithrombotic administered or contraindication documented by LIP by end of day 2   Once lipid panel has resulted, check LDL. Contact provider for statin order if LDL > 70 (or ensure contraindication documented by LIP).   Continue stroke education (must include Modifiable Risk Factors, Warning Signs and Symptoms of Stroke, Activation of Emergency Medical System and Follow-up Appointments). Ensure handout has been given and documented.  Goal: Neurological status is stable or improving  Outcome: Not Progressing  Flowsheets (Taken 03/10/2023 2005)  Neurological status is stable or improving:   Monitor/assess/document neurological assessment (Stroke: every 4 hours)   Monitor/assess NIH Stroke Scale   Observe for seizure activity and initiate seizure precautions if  indicated   Re-assess NIH Stroke Scale for any change in status   Perform CAM Assessment  Goal: Stable vital signs and fluid balance  Outcome: Progressing  Flowsheets (Taken 03/10/2023 2005)  Stable vital signs and fluid balance:   Position patient for maximum circulation/cardiac output   Monitor and assess vitals every 4 hours or as ordered and hemodynamic parameters   Monitor intake and output. Notify LIP if urine output is < 30 mL/hour.   Apply telemetry monitor as ordered  Goal: Patient will maintain adequate oxygenation  Outcome: Progressing  Flowsheets (Taken 03/10/2023 2005)  Patient will maintain adequate oxygenation: Maintain SpO2 of greater than 92%  Goal: Patient's risk of aspiration will be minimized  Outcome: Progressing  Flowsheets (Taken 03/10/2023 2005)  Patient's risk of aspiration will be minimized:   Complete new dysphagia screen for any change in status: Keep patient NPO if patient fails   Monitor/assess for signs of aspiration (tachypnea, cough, wheezing, clearing throat, hoarseness after eating, decrease in SaO2   Order modified texture diet as recommend by Speech Pathologist   Assess and monitor ability to swallow  Goal: Nutritional intake is adequate  Outcome: Progressing  Flowsheets (Taken 03/10/2023 2005)  Nutritional intake is adequate: Consult/collaborate with Speech Therapy (swallow evaluations)  Goal: Elimination patterns are normal or improving  Outcome: Progressing  Goal: Mobility/Activity is maintained at optimal level for patient  Outcome: Progressing  Flowsheets (Taken 03/10/2023 2005)  Mobility/activity is maintained at optimal level for patient: Encourage independent activity per ability  Goal: Skin integrity is maintained or improved  Outcome: Progressing  Flowsheets (Taken 03/10/2023 2005)  Skin integrity is maintained or improved:   Assess Braden Scale every shift   Turn or reposition patient every 2 hours or as needed unless able to reposition self  Increase activity as  tolerated/progressive mobility   Relieve pressure to bony prominences   Avoid shearing  Goal: Neurovascular status is stable or improving  Outcome: Not Progressing  Flowsheets (Taken 03/10/2023 2005)  Neurovascular status is stable or improving:   Monitor/assess neurovascular status (pulses, capillary refill, pain, paresthesia, presence of edema)   Monitor/assess for signs of Venous Thrombus Emboli (edema of calf/thigh redness, pain)  Goal: Effective coping demonstrated  Outcome: Progressing  Flowsheets (Taken 03/10/2023 2005)  Effective coping demonstrated: Offer reassurance to decrease anxiety  Goal: Will be able to express needs and understand communication  Outcome: Progressing  Flowsheets (Taken 03/10/2023 2005)  Able to express needs and understand communication: Patient/patient care companion demonstrates understanding on disease process, treatment plan, medications and discharge plan

## 2023-03-10 NOTE — Progress Notes (Addendum)
1545 - Received pt in ED, NIH completed with ED RN. Pt taken right to CT scan.   1700 - Pt tolerating routine NIH, no significant changes, lab work collected and sent. Awaiting Covid swab from lab. Called MRI, was given time of 1930-2000.   1805 - NIH increased by 1 to 8, LLE now has decreased sensation and no antigravity effort. Dr Bonney Leitz to bedside, stat [facility] ordered. Pt in CT, was told that MRI would be ready, explained that pt needed to be medicated for anxiety.   1845 - Pt returned back to room, now verbalizing correct month.   1900 - Handoff completed with night RN at bedside, retrieved MRI pump from MRI.

## 2023-03-11 ENCOUNTER — Inpatient Hospital Stay: Payer: No Typology Code available for payment source

## 2023-03-11 ENCOUNTER — Inpatient Hospital Stay (HOSPITAL_COMMUNITY): Payer: No Typology Code available for payment source

## 2023-03-11 DIAGNOSIS — R531 Weakness: Secondary | ICD-10-CM

## 2023-03-11 LAB — ECHO ADULT TTE COMPLETE
AV Area (Cont Eq VTI): 3.392
AV Area (Cont Eq VTI): 3.43
AV Mean Gradient: 4
AV Mean Gradient: 4
AV Mean Gradient: 4
AV Mean Gradient: 4
AV Mean Gradient: 4
AV Peak Velocity: 1.26
AV Peak Velocity: 1.36
AV Peak Velocity: 1.37
AV Peak Velocity: 1.37
AV Peak Velocity: 1.41
IVS Diastolic Thickness (2D): 1.14
LA Dimension (2D): 3.2
LA Dimension (2D): 3.7
LA Dimension (2D): 3.7
LA Volume Index (BP A-L): 16
LVID diastole (2D): 4.51
LVID systole (2D): 2.67
MV Area (PHT): 3.91
MV E/A: 1.428
MV E/A: 1.5
MV E/e' (Average): 11.591
Prox Ascending Aorta Diameter: 2.6
RV Basal Diastolic Dimension: 4.29
RV Function: NORMAL
RV Systolic Pressure: 22.536
RV Systolic Pressure: 23
Site RA Size (AS): NORMAL
Site RV Size (AS): NORMAL
Summary: NORMAL
TAPSE: 2.43
Tricuspid Valve Findings: NORMAL

## 2023-03-11 LAB — LIPID PANEL
Cholesterol / HDL Ratio: 5.4 Index
Cholesterol: 151 mg/dL (ref 0–199)
HDL: 28 mg/dL — ABNORMAL LOW (ref 40–9999)
LDL Calculated: 96 mg/dL (ref 0–99)
Triglycerides: 137 mg/dL (ref 34–149)
VLDL Calculated: 27 mg/dL (ref 10–40)

## 2023-03-11 LAB — COVID-19 (SARS-COV-2) & INFLUENZA  A/B, NAA (ROCHE LIAT)
Influenza A: NOT DETECTED
Influenza B: NOT DETECTED
SARS-CoV-2 Overall Result: NOT DETECTED

## 2023-03-11 LAB — RENAL FUNCTION PANEL
Albumin: 3.9 g/dL (ref 3.5–5.0)
Anion Gap: 11 (ref 5.0–15.0)
BUN: 14 mg/dL (ref 9.0–28.0)
CO2: 24 mEq/L (ref 17–29)
Calcium: 9.6 mg/dL (ref 8.5–10.5)
Chloride: 106 mEq/L (ref 99–111)
Creatinine: 1.1 mg/dL (ref 0.5–1.5)
Glucose: 102 mg/dL — ABNORMAL HIGH (ref 70–100)
Phosphorus: 5.5 mg/dL — ABNORMAL HIGH (ref 2.3–4.7)
Potassium: 4.2 mEq/L (ref 3.5–5.3)
Sodium: 141 mEq/L (ref 135–145)
eGFR: >60 mL/min/{1.73_m2} (ref >=60)

## 2023-03-11 LAB — URINALYSIS WITH REFLEX TO MICROSCOPIC EXAM - REFLEX TO CULTURE
Bilirubin, UA: NEGATIVE
Blood, UA: NEGATIVE
Glucose, UA: NEGATIVE
Ketones UA: NEGATIVE
Leukocyte Esterase, UA: NEGATIVE
Nitrite, UA: NEGATIVE
Specific Gravity UA: >1.05 — AB (ref 1.001–1.035)
Urine pH: 6 (ref 5.0–8.0)
Urobilinogen, UA: NORMAL mg/dL (ref 0.2–2.0)

## 2023-03-11 LAB — LITHIUM LEVEL: Lithium Level: 0.36 mEq/L — ABNORMAL LOW (ref 1.000–1.200)

## 2023-03-11 LAB — CBC
Absolute NRBC: 0 10*3/uL (ref 0.00–0.00)
Hematocrit: 43.7 % (ref 37.6–49.6)
Hgb: 15 g/dL (ref 12.5–17.1)
MCH: 27.2 pg (ref 25.1–33.5)
MCHC: 34.3 g/dL (ref 31.5–35.8)
MCV: 79.2 fL (ref 78.0–96.0)
MPV: 9.1 fL (ref 8.9–12.5)
Nucleated RBC: 0 /100 WBC (ref 0.0–0.0)
Platelets: 352 10*3/uL — ABNORMAL HIGH (ref 142–346)
RBC: 5.52 10*6/uL (ref 4.20–5.90)
RDW: 12 % (ref 11–15)
WBC: 8.91 10*3/uL (ref 3.10–9.50)

## 2023-03-11 LAB — URINE DRUGS OF ABUSE SCREEN
Barbiturate Screen, UR: NEGATIVE
Benzodiazepine Screen, UR: NEGATIVE
Cannabinoid Screen, UR: NEGATIVE
Cocaine, UR: NEGATIVE
Opiate Screen, UR: NEGATIVE
PCP Screen, UR: NEGATIVE
Urine Amphetamine Screen: POSITIVE — AB
Urine Fentanyl: NEGATIVE

## 2023-03-11 LAB — MAGNESIUM: Magnesium: 2.3 mg/dL (ref 1.6–2.6)

## 2023-03-11 LAB — HIGH SENSITIVITY TROPONIN-I: hs Troponin-I: <2.7 ng/L

## 2023-03-11 LAB — HEMOGLOBIN A1C
Average Estimated Glucose: 108.3 mg/dL
Hemoglobin A1C: 5.4 % (ref 4.6–5.6)

## 2023-03-11 LAB — PT/INR
PT INR: 1.1 (ref 0.9–1.1)
PT: 12.6 s (ref 10.1–12.9)

## 2023-03-11 LAB — THYROID STIMULATING HORMONE (TSH) WITH REFLEX TO FREE T4: TSH, Abn Reflex to Free T4, Serum: 1.87 u[IU]/mL (ref 0.35–4.94)

## 2023-03-11 LAB — APTT: PTT: 34 s (ref 27–39)

## 2023-03-11 MED ORDER — HEPARIN SODIUM (PORCINE) 5000 UNIT/ML IJ SOLN
5000.0000 [IU] | Freq: Three times a day (TID) | INTRAMUSCULAR | Status: DC
Start: 2023-03-11 — End: 2023-03-17
  Administered 2023-03-11 – 2023-03-17 (×18): 5000 [IU] via SUBCUTANEOUS
  Filled 2023-03-11 (×18): qty 1

## 2023-03-11 MED ORDER — DIPHENHYDRAMINE HCL 25 MG PO CAPS
25.0000 mg | ORAL_CAPSULE | Freq: Four times a day (QID) | ORAL | Status: DC | PRN
Start: 2023-03-11 — End: 2023-03-17

## 2023-03-11 MED ORDER — BUSPIRONE HCL 5 MG PO TABS
5.0000 mg | ORAL_TABLET | Freq: Two times a day (BID) | ORAL | Status: DC
Start: 2023-03-11 — End: 2023-03-17
  Administered 2023-03-11 – 2023-03-17 (×13): 5 mg via ORAL
  Filled 2023-03-11 (×13): qty 1

## 2023-03-11 MED ORDER — QUETIAPINE FUMARATE 25 MG PO TABS
25.0000 mg | ORAL_TABLET | Freq: Every evening | ORAL | Status: DC | PRN
Start: 2023-03-11 — End: 2023-03-17
  Administered 2023-03-12 – 2023-03-16 (×5): 25 mg via ORAL
  Filled 2023-03-11 (×5): qty 1

## 2023-03-11 NOTE — Plan of Care (Addendum)
Major shift events:   - Repeat CT head done.  - Worked with PT/OT OOBTC with moderate assist and walker.   - downgraded from ICU around 1600.    Review of Systems:    NEURO: AxOx4. GCS 15. On post-TNK protocol until 1600. Neuro checks q4h. PERRLA. NIH 5. L sided numbness/decreased sensation including face, arm and leg. No sensation on L thigh. Unable to overcome gravity with LLE, unable to dorsiflex/plantar flex, able to wiggle toes. Worked with PT/OT today OOBTC, with moderate assist with walker. Complained of mild headache 2/10, relieved with drinking more water.    CV: NSR, HR 60-90s. SBP goal 120-180. SBP 120-130s.     RESP: On room air, O2sat=96%. Clear lung sounds, easy work of breathing.     GI: Regular diet. Good appetite. LBM prior to admission.     GU: Continent. Uses urinal. UOP=520mL. Amber/clear urine.    Misc: EEG order discontinued. Psych consult placed.     Problem: Moderate/High Fall Risk Score >5  Goal: Patient will remain free of falls  Outcome: Progressing  Flowsheets (Taken 03/11/2023 0800)  High (Greater than 13):   HIGH-Visual cue at entrance to patient's room   HIGH-Utilize chair pad alarm for patient while in the chair   HIGH-Bed alarm on at all times while patient in bed   HIGH-Apply yellow "Fall Risk" arm band   HIGH-Pharmacy to initiate evaluation and intervention per protocol   HIGH-Initiate use of floor mats as appropriate   HIGH-Consider use of low bed     Problem: Every Day - Stroke  Goal: Neurological status is stable or improving  Outcome: Progressing  Flowsheets (Taken 03/11/2023 1638)  Neurological status is stable or improving:   Monitor/assess/document neurological assessment (Stroke: every 4 hours)   Monitor/assess NIH Stroke Scale   Re-assess NIH Stroke Scale for any change in status   Observe for seizure activity and initiate seizure precautions if indicated   Perform CAM Assessment  Goal: Mobility/Activity is maintained at optimal level for patient  Outcome:  Progressing  Flowsheets (Taken 03/11/2023 1638)  Mobility/activity is maintained at optimal level for patient:   Encourage independent activity per ability   Consult/collaborate with Physical Therapy and/or Occupational Therapy  Goal: Effective coping demonstrated  Outcome: Progressing  Flowsheets (Taken 03/11/2023 1638)  Effective coping demonstrated:   Assess/report to LIP uncontrolled anxiety, depression, or ineffective coping   Offer reassurance to decrease anxiety     Problem: Safety  Goal: Patient will be free from injury during hospitalization  Outcome: Progressing  Flowsheets (Taken 03/11/2023 1642)  Patient will be free from injury during hospitalization:   Assess patient's risk for falls and implement fall prevention plan of care per policy   Provide and maintain safe environment   Use appropriate transfer methods   Hourly rounding   Include patient/ family/ care giver in decisions related to safety   Provide alternative method of communication if needed (communication boards, writing)   Ensure appropriate safety devices are available at the bedside     Problem: Psychosocial and Spiritual Needs  Goal: Demonstrates ability to cope with hospitalization/illness  Outcome: Progressing  Flowsheets (Taken 03/11/2023 1642)  Demonstrates ability to cope with hospitalizations/illness:   Encourage verbalization of feelings/concerns/expectations   Provide quiet environment   Assist patient to identify own strengths and abilities   Encourage patient to set small goals for self   Include patient/ patient care companion in decisions

## 2023-03-11 NOTE — Plan of Care (Signed)
-  AOX4, GCS 15/15.  (+) left sided weakness, decreased sensation on left side. PERRLA.   -NSR HR 60-90/min.   BP 120-149mmHg. Afebrile. Distal pulses on the upper extremities are palpable and lower extremities can be heard thru doppler.   - Room air. Sat 94-96%.   -On Regular diet. (-) BM  -voids freely using urinal.   MRI done.        Problem: Moderate/High Fall Risk Score >5  Goal: Patient will remain free of falls  Outcome: Progressing  Flowsheets (Taken 03/10/2023 2000)  High (Greater than 13):   HIGH-Consider use of low bed   HIGH-Initiate use of floor mats as appropriate   HIGH-Apply yellow "Fall Risk" arm band   HIGH-Bed alarm on at all times while patient in bed   HIGH-Visual cue at entrance to patient's room     Problem: Compromised Sensory Perception  Goal: Sensory Perception Interventions  Outcome: Progressing  Flowsheets (Taken 03/11/2023 0036)  Sensory Perception Interventions: Offload heels, Pad bony prominences, Reposition q 2hrs/turn Clock, Q2 hour skin assessment under devices if present     Problem: Compromised Activity/Mobility  Goal: Activity/Mobility Interventions  Outcome: Progressing  Flowsheets (Taken 03/11/2023 0036)  Activity/Mobility Interventions: Pad bony prominences, TAP Seated positioning system when OOB, Promote PMP, Reposition q 2 hrs / turn clock, Offload heels     Problem: Every Day - Stroke  Goal: Neurological status is stable or improving  Outcome: Progressing  Flowsheets (Taken 03/11/2023 0036)  Neurological status is stable or improving:   Monitor/assess/document neurological assessment (Stroke: every 4 hours)   Re-assess NIH Stroke Scale for any change in status   Observe for seizure activity and initiate seizure precautions if indicated   Perform CAM Assessment  Goal: Stable vital signs and fluid balance  Outcome: Progressing  Flowsheets (Taken 03/11/2023 0036)  Stable vital signs and fluid balance:   Position patient for maximum circulation/cardiac output   Monitor and assess  vitals every 4 hours or as ordered and hemodynamic parameters   Monitor intake and output. Notify LIP if urine output is < 30 mL/hour.   Apply telemetry monitor as ordered   Encourage oral fluid intake  Goal: Patient will maintain adequate oxygenation  Outcome: Progressing  Flowsheets (Taken 03/11/2023 0036)  Patient will maintain adequate oxygenation:   Suction secretions as needed   Maintain SpO2 of greater than 92%  Goal: Mobility/Activity is maintained at optimal level for patient  Outcome: Progressing  Flowsheets (Taken 03/11/2023 0036)  Mobility/activity is maintained at optimal level for patient: Encourage independent activity per ability  Goal: Neurovascular status is stable or improving  Outcome: Progressing  Flowsheets (Taken 03/11/2023 0036)  Neurovascular status is stable or improving:   Monitor/assess neurovascular status (pulses, capillary refill, pain, paresthesia, presence of edema)   Monitor/assess for signs of Venous Thrombus Emboli (edema of calf/thigh redness, pain)   Monitor/assess site of invasive procedure for signs of bleeding

## 2023-03-11 NOTE — OT Eval Note (Signed)
Occupational Therapy Eval and Treatment Kurt Rogers        Post Acute Care Therapy Recommendations   Discharge Recommendations:  Acute Rehab    DME needs IF patient is discharging home: Shower chair, Front wheel walker    Therapy discharge recommendations may change with patient status.  Please refer to most recent note for up-to-date recommendations.    Unit: [facility]  Bed: A2710/A2710-01      ___________________________________________________    Time of Evaluation and Treatment:  Time Calculation  OT Received On: 03/11/23  Start Time: 0856  Stop Time: 0923  Time Calculation (min): 27 min       Chart Review and Collaboration with Care Team: 5 minutes, not included in above time.    Evaluation: 10 minutes  Treatment:  17 minutes    OT Visit Number: 1    Consult received for Kurt Rogers for OT Evaluation and Treatment.  Patient's medical condition is appropriate for Occupational therapy intervention at this time.    Activity Orders:  OT eval and treat    Precautions and Contraindications:  Precautions  Weight Bearing Status: no restrictions  Other Precautions: Aspiration precautions,  Bleeding precautions,  Fall precautions,  Seizure precautions,  Skin and pressure ulcer precautions    Personal Protective Equipment (PPE)  gloves and procedure mask    Medical Diagnosis:  Left-sided weakness [R53.1]    History of Present Illness:  Kurt Rogers is a 32 y.o. 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 Presented yesterday with left sided symptoms including weakness, numbness, and tremor. NK given possible seizure though would be very unusual in such a young pt without risk factors.  Upon further questioning, pt has had 2-3 such prior events though never with a tremor.  Symptoms are now markedly improved. Must consider psychogenic etiology in light of negative prior work ups, sudden  onset symptoms, and pt's failure to discuss fully his other events with me when TNK was being considered. (He did though refer to left leg weakness occurring about 1 year ago).  Consider also hemiplegic migraine without headache.  Consider highly atypical seizure disorder.     -  MRI brain is essentially negative.  Barely seen WM changes L frontal lobe which would not cause left sided symptoms and are not acute.    Patient Active Problem List   Diagnosis    Left-sided weakness    Mitral valve disease        Past Medical/Surgical History:  Past Medical History:   Diagnosis Date    Abnormal vision     glasses    Anxiety     Attention deficit hyperactivity disorder (ADHD)     Depression     Difficulty walking     uses cane at times    Generalized weakness 2014    in left leg since 2014    Mitral valve prolapse     Neurological abnormality     Non-rheumatic mitral valve disease     Numbness     TIA (transient ischemic attack)     in 2021 per PT     Past Surgical History:   Procedure Laterality Date    RECONSTRUCTION, FACIAL  2014    left side near eye brow    RECONSTRUCTION, MYOCUTANEOUS FLAP  02/17/2023    Procedure: RECONSTRUCTION, FASCIOCUTANEOUS FLAP, LEFT LEG;  Surgeon: Lorene Dy, DO;  Location: [facility]  MAIN OR;  Service: General;;    RELEASE, LOWER EXTREMITY CONTRACTURE  02/17/2023    Procedure: RELEASE, LOWER EXTREMITY CONTRACTURE;  Surgeon: Lorene Dy, DO;  Location: [facility];  Service: General;;    REPAIR, FASCIAL DEFECT Left 02/17/2023    Procedure: REPAIR, FASCIAL DEFECT LEFT LEG;  Surgeon: Lorene Dy, DO;  Location: [facility];  Service: General;  Laterality: Left;    TONSILLECTOMY      as child        X-Rays/Tests/Labs  Lab Results   Component Value Date/Time    HGB 15.0 03/11/2023 03:15 AM    HCT 43.7 03/11/2023 03:15 AM    K 4.2 03/11/2023 03:10 AM    NA 141 03/11/2023 03:10 AM    INR 1.1 03/11/2023 03:10 AM    TROPI <2.7 03/11/2023 03:10 AM    TROPI <2.7 03/10/2023 05:32 PM    TROPI Unable  toCalc. 03/10/2023 05:32 PM    TROPI <2.7 03/10/2023 02:29 PM    TROPI <2.7 02/19/2023 03:09 AM       All imaging reviewed. 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 Nursing clears patient for therapy.   Patient Goal: "get my left leg better and get back to walking         Objective:      Observation of Patient/Vital Signs:BP 139/75   Pulse 91   Temp 98.1 F (36.7 C) (Oral)   Resp 20   Ht 1.778 m (5\' 10" )   Wt 108.1 kg (238 lb 5.1 oz)   SpO2 97%   BMI 34.20 kg/m       Cognitive Status and Neuro Exam:  Cognition/Neuro Status  Arousal/Alertness: Appropriate responses to stimuli  Attention Span: Appears intact  Orientation Level: Oriented X4  Memory: Appears intact  Following Commands: independent  Safety Awareness: independent  Insights: Fully aware of deficits  Problem Solving: Able to problem solve independently    Musculoskeletal Examination  Gross ROM  Right Upper Extremity ROM: within functional limits  Left Upper Extremity  ROM: within functional limits  Right Lower Extremity ROM: within functional limits  Left Lower Extremity ROM: needs focused assessment  Left Lower Extremity ROM % Reduced: reduced by 75%  Gross Strength  Right Upper Extremity Strength: within functional limits  Left Upper Extremity Strength: 4-/5  Right Lower Extremity Strength: within functional limits  Left Lower Extremity Strength: 2-/5       Sensory/Oculomotor Examination  Sensory  Auditory: intact  Tactile - Light Touch: impaired left (significantly decreased on LLE, slightly decreased LUE)  Visual Acuity: wears glasses    Vision - Complex Assessment  Ocular Range of Motion: Within Functional Limits    Activities of Daily Living  Self-care and Home Management  Eating: Independent  LB Dressing: Minimal Assist, Don/doff R sock, Don/doff L sock, Thread RLE into underwear, Thread LLE into underwear, Pull up over hips (cues for hemi dressing in bed to donn shorts. Pt able to donn R sock, needed OT assistance for [employer])    Functional Mobility:  Mobility and Transfers  Scooting to [facility]: Supervision  Scooting to EOB: Supervision  Supine to Sit: Minimal Assist (to bring LLE over EOB)  Sit to Supine: Minimal Assist (to bring LLE over EOB)  Sit to Stand: Stand by Assist, with instruction for hand placement to increase safety, bed elevated (using RW)  Functional Mobility/Ambulation: Minimal Assist (OT assistance to move LLE)     Balance  Balance  Static Sitting Balance: Supervision  Dynamic Standing Balance: Stand by Assist    Participation and Activity Tolerance  Participation and Endurance  Participation Effort: excellent  Endurance: Tolerates 10 - 20 min exercise with multiple rests    Educated the Patient to role of occupational therapy, plan of care, goals of therapy and safety with mobility and ADLs, home safety with verbalized understanding .    Patient left in bed with all medical equipment in place and call bell and all personal items/needs within reach (of note,  pt received without alarm in place).  RN notified of session outcome. Notified CM team and attending of d/c recommendation via secure chat.      Assessment:  Kurt Rogers is a 32 y.o. male admitted 03/10/2023. OT Assessment: decreased ROM;decreased strength;decreased independence with ADLs;decreased independence with IADLs      Treatment:  - SPV to donn pants in bed, OT edu pt re: hemi dressing techniques to increase independence, pt able to demo  - Min A to donn socks in bed, OT assisting with donning LLE sock  - Min A to transition supine <> EOB, assistance to bring LLE over EOB.  - Pt sit to stand c RW c CGA, VC for hand placement.  - Min A to side step and step forward with RW, OT providing mod A to move LLE    Rehabilitation Potential: Prognosis: Good    Plan:  OT Frequency Recommended: 3-4x/wk   Treatment Interventions: ADL retraining, Functional transfer training     PMP Activity: Step 5 - Chair  Distance Walked (ft) (Step 6,7): 5 Feet    Risks/benefits/POC discussed    Goals:  Time For Goal Achievement: by time of discharge  ADL Goals  Patient will groom self: Supervision, at sinkside  Patient will dress lower body: Supervision  Mobility and Transfer Goals  Pt will transfer bed to toilet: Stand by Assist, with rolling walker                           Bethann Humble, OTR/L [id number]  03/11/2023 9:36 AM    [facility]  Patient: Kurt Rogers MRN#: [id number]   Unit: [facility] Bed: A2710/A2710-01

## 2023-03-11 NOTE — Plan of Care (Signed)
..  NEURO:  A/O X4  Following commands, grips strong R hand, moderate L hand,   Q4hr Neuro checks;   NIH q shift -  4  PERRLA:  CV:  Afebrile. Skin Warm and dry ,  pulses palpable;   HR: 60s-80s; SR on monitor.   SBP: 110s-130s ; normotensive  Denies chest pain.     Respiratory:   RA, O2 Sat >95%.   GI:  Diets: reg  Abdomen soft , bowel sounds present, LBM 4/26  GU:  External catheter intact/Foley in place, foley care rendered  UO= 2 large unmeasured  SKIN:   Mepilex/Adhesive Foam Dressing applied to sacrum/BL heels  Kept clean, dry and comfortable.     Encouraged to call for assistance as needed. Safety maintained. Bed placed in the lowest position with call bell in reach, bed alarm on, and hourly rounding completed.  No further needs at this time.        Problem: Moderate/High Fall Risk Score >5  Goal: Patient will remain free of falls  Outcome: Progressing  Flowsheets (Taken 03/11/2023 2003)  Moderate Risk (6-13):   MOD-Consider activation of bed alarm if appropriate   MOD-Apply bed exit alarm if patient is confused   MOD-Floor mat at bedside (where available) if appropriate   MOD-Consider a move closer to Nurses Station   MOD-Remain with patient during toileting   MOD-Place bedside commode and assistive devices out of sight when not in use   MOD-Re-orient confused patients   MOD-Utilize diversion activities   MOD-Perform dangle, stand, walk (DSW) prior to mobilization   MOD-Request PT/OT consult order for patients with gait/mobility impairment   MOD-Use gait belt when appropriate   MOD-include family in multidisciplinary POC discussions   MOD- Consider video monitoring     Problem: Every Day - Stroke  Goal: Neurological status is stable or improving  Outcome: Progressing  Flowsheets (Taken 03/11/2023 2003)  Neurological status is stable or improving:   Monitor/assess/document neurological assessment (Stroke: every 4 hours)   Monitor/assess NIH Stroke Scale   Re-assess NIH Stroke Scale for any change in status    Observe for seizure activity and initiate seizure precautions if indicated   Perform CAM Assessment  Goal: Mobility/Activity is maintained at optimal level for patient  Flowsheets (Taken 03/11/2023 2003)  Mobility/activity is maintained at optimal level for patient:   Encourage independent activity per ability   Consult/collaborate with Physical Therapy and/or Occupational Therapy  Goal: Neurovascular status is stable or improving  Outcome: Progressing  Flowsheets (Taken 03/11/2023 2003)  Neurovascular status is stable or improving:   Monitor/assess neurovascular status (pulses, capillary refill, pain, paresthesia, presence of edema)   Monitor/assess for signs of Venous Thrombus Emboli (edema of calf/thigh redness, pain)   Monitor/assess site of invasive procedure for signs of bleeding

## 2023-03-11 NOTE — Progress Notes (Signed)
 Inpatient Rehab Note:  Referral received. Yes, willing to accept at choice facility, patient pending medical clearance, insurance verification/insurance auth and verification of post [employer] plan.   Will verify post rehab discharge plan with the patient/caregiver.  Benefits sent to finance for confirmation of coverage.  Patient will require insurance auth for admission to [facility]. Advise when patient is stable for [place] to start auth.  CM/SW notified of decision.    Tennis Ship, PT, MPT, CLT  [facility] Inpatient Rehab Admissions Liaison  [phone]

## 2023-03-11 NOTE — UM Notes (Signed)
PATIENT NAME: Kurt Rogers,Kurt Rogers   DOB: 07-Aug-1991     Adult Admit to Inpatient (Order #[id number]) on 03/10/23     DOWNGRADED TO OBSERVATION       Place for Observation Services (Order #[id number]) on 03/11/23       Admit Diagnosis: Left-sided weakness [R53.1]  Facility: [facility]    Reason for Admission   -32 year old male admitted to inpatient at [facility] on 4/24. Patient downgraded from inpatient to observation on 4/25. Patient past medical history DHD, anxiety, depression, bipolar disorder, recent surgical repair of left anterior tibialis. Patient presents with LUE tremor while at work, along with left facial, LUE and LLE numbness, and left-sided weakness. TNK given at 1357.     Vital Signs  Temp 98.5  HR 110  RR 35  BP 173/104  SPO2 93% on RA    Medications  Scheduled Meds:  Current Facility-Administered Medications   Medication Dose Route Frequency    amphetamine-dextroamphetamine  10 mg Oral BID    busPIRone  5 mg Oral BID    lithium  300 mg Oral TID    senna-docusate  1 tablet Oral QHS     Continuous Infusions:   niCARdipine         Abnormal Labs   Latest Reference Range & Units 03/10/23 14:29   Platelet Count 142 - 346 x10 3/uL 469 (H)   Instrument Absolute Neutrophil Count 1.10 - 6.33 x10 3/uL 6.67 (H)   Neutrophils Absolute 1.10 - 6.33 x10 3/uL 6.67 (H)   ALT 0 - 55 U/L 78 (H)       Imaging   US Venous Low Extrem Duplx Dopp Comp Bilat    Result Date: 03/11/2023   No sonographic evidence for right or left lower extremity deep venous thrombosis. Marty Heck, MD 03/11/2023 9:20 AM    MR Angiogram Head WO Contrast    Result Date: 03/10/2023  1.There is no stenosis of the proximal right internal carotid artery based on NASCET criteria.  2.There is no stenosis of the proximal left internal carotid artery based on NASCET criteria. 3.The vertebrobasilar arterial system is patent. 4.Normal intracranial MR angiogram. Terrilee Croak, MD 03/10/2023 10:10 PM    MR Angiogram  Neck W WO Contrast    Result Date: 03/10/2023  1.There is no stenosis of the proximal right internal carotid artery based on NASCET criteria.  2.There is no stenosis of the proximal left internal carotid artery based on NASCET criteria. 3.The vertebrobasilar arterial system is patent. 4.Normal intracranial MR angiogram. Terrilee Croak, MD 03/10/2023 10:10 PM    MRI Brain W WO Contrast    Result Date: 03/10/2023   1.Small foci of T2 prolongation in the anterior left subinsular region also with minor gliosis related to a prior ischemic, infectious inflammatory process. 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 Terrilee Croak, MD 03/10/2023 10:05 PM    CT Head WO Contrast    Result Date: 03/10/2023   1.Normal examination. Terrilee Croak, MD 03/10/2023 6:58 PM    CT Angiogram Head Neck    Result Date: 03/10/2023  1.rCBF<30% images show 0 cc. Tmax>6sec images show 0 cc. 2.The intracranial arteries are patent, no proximal intracranial large vessel occlusion. 3.There is no stenosis of the proximal right internal carotid artery based on NASCET criteria. 4.There is no stenosis of the proximal left internal carotid artery based on NASCET criteria.  Alex Einar Pheasant, MD 03/10/2023 4:04 PM  CT Angiogram Cerebral Perfusion W 3D Reconstruction    Result Date: 03/10/2023  1.rCBF<30% images show 0 cc. Tmax>6sec images show 0 cc. 2.The intracranial arteries are patent, no proximal intracranial large vessel occlusion. 3.There is no stenosis of the proximal right internal carotid artery based on NASCET criteria. 4.There is no stenosis of the proximal left internal carotid artery based on NASCET criteria.  Alex Einar Pheasant, MD 03/10/2023 4:04 PM    CT Head without Contrast    Result Date: 03/10/2023   No acute intracranial abnormality. Georgann Housekeeper, MD 03/10/2023 2:00 PM      Plan of Care  Per Critical Care  NEUROLOGICAL:   #Stroke symptoms s/p thrombolysis, initial NIHSS =  7  #History of anxiety/depression  #History of bipolar disorder  #History of attention deficit hyperactivity disorder  #Possibly history of multiple sclerosis vs. conversion disorder  #History of left frontal T2 FLAIR hyperintensities on prior MRI  -Admit to [facility] ICU for post thrombolysis stroke protocol  -CTH, CTA head/neck and CTP negative  -Hourly neuro checks, STAT head CT for acute neurologic change  -SBP goal 120-180   -Repeat brain imaging at 24 hours post TNK  -MRI brain w/o contrast and MRA head/neck ordered  -Stroke workup: FLP, HbA1c, TSH, urine drug screen, TTE with bubble study  -Start high-intensity statin  -No anticoagulants or antiplatelet agents for first 24 hours  -Neurology consulted, discussed with Dr. Moise Boring, input appreciated  -Avoid benzodiazepines and sedatives  -Tylenol as needed for pain/fever  -Med reconciliation completed with assistance by pharmacist.  Will continue home lithium and Adderall.  Holding BuSpar and nightly Seroquel for now.  -Passed SLP, noted some word finding difficulties.  SLP cognitive/language evaluation ordered per their recommendation.  -Hypercoagulable workup once out of TNK window  -Check ESR and CRP to screen for vasculitis  -PT/OT, OOB to chair as indicated/tolerated  -Delirium precautions including maintenance of day/night wake cycles, frequent reorientation as needed  -Etiology of stroke is unclear in this young man with few risk factors.  Some family history of heart attack and MS, no strokes.  -Differential also includes demyelinating disease.  Per chart review, patient has had prior similar episodes of acute left-sided weakness, most recently July 2023 and prior episodes dating back to February 2022.  He has reportedly undergone extensive neurologic workup by neurologist [facility]. Lanette [facility] at [facility], including MRI brain February 2022 showing left frontal signal abnormalities, as well as LP with CSF studies reportedly negative for MS.  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  Nicardipine gtt if necessary.  -Troponin negative, no chest pain or clinical s/s of ACS.  -ECG with NSR, normal axis, S wave in lead I, Q wave and inverted T-wave in Lead 3 which were present in prior tracings dating back to August 2021.  Nonspecific ST changes with no evidence to suggest ischemia.  -TTE with bubble ordered for stroke workup      PULMONARY:   No issues, oxygenating well on room air  Supplemental O2 as needed for goal SpO2 >92%     RENAL:   No acute issues, electrolytes benign  Daily BMP, replace lytes per protocol  Monitor UOP     GASTROINTESTINAL:  #At risk for constipation  Nutrition: N.p.o. pending bedside nursing swallow assessment  Bowel  Regimen with nightly Peri-Colace  GI ppx: Not indicated     INFECTIOUS DISEASE:   No acute infectious concerns  Check COVID swab as COVID-19 is known to be associated with thrombotic events.  Of note patient was positive for COVID-19 at the end of 2023 per chart review     HEMATOLOGY/ONCOLOGY:   No acute issues  - VTE ppx: Hold pharmacologic DVT ppx pending repeat neuroimaging at 24 hours     ENDOCRONOLOGY/RHEUMATOLOGY:   No acute issues  Glucose goal 100-180     FAMILY UPDATE:   Patient's father Ryson Bacha was updated, all questions answered.  Per Neurology    Pt offered and accepted getting IV-TNK after discussing risks and benefits  -  Head CT appears negative  -  Check tox screen given new tremors  -  Will need ICU placement after TNK  -  neuro checks per post-TNK protocol  -  MRI brain and MRA head/neck and echocardiogram  -  will follow up       Primary Coverage:  Payor: [employer] / Plan: [facility] MEDICA [address]08 CHOICE / Product Type: COMMERCIAL /     UTILIZATION REVIEW CONTACT: Wendall Stade RN, BSN  Utilization Review    [facility]  [phone]   [phone]  Email: [email or web address]  Tax ID:  [id number]         NOTES TO REVIEWER:    This clinical review is based on/compiled from documentation provided by the treatment team within the patient's medical record.

## 2023-03-11 NOTE — Progress Notes (Signed)
[facility]  Progress Note       Patient Name: Kurt Rogers  MRN: [id number]  Room: A2710/A2710-01  Code Status: Full Code    Chief Complaint / Primary Reason for MSICU Evaluation   Stroke symptoms s/p TNK    History of Presenting Illness   Kurt Rogers is a 32 y.o. male with h/o ADHD, anxiety, depression, bipolar disorder, recent surgical repair of left anterior tibialis, LKW 1230 today when he developed acute onset of LUE tremor while at work, along with left facial, LUE and LLE numbness, and left-sided weakness.  Patient reportedly had a similar episode about a year ago.  Brought into ED, tachycardic with HR 110, BP 144/83, all other vitals normal, GCS = 15, initial NIHSS initially reported as 2.  CTH showed NAD.  Neurology was consulted and patient received TNK at 1357 this afternoon.  [facility] was consulted for admission to [facility] ICU.  On my evaluation in ED, RN reported initial NIHSS was in fact 7, currently measuring at 6.  Patient denied HA, dizziness, lightheadedness, chest pain, dyspnea, nausea, vomiting, or visual changes.  Case discussed with neurologist in light of NIH higher than initially understood and additional neuroimaging was ordered.  CTA head/neck was negative for LVO and CTP was unremarkable .      4/25: Patient seen and examined at bedside.  Patient denies any chest pain shortness of breath or nausea vomiting.  Patient admits that his left-sided weakness improved however still unable to move his left lower extremity and admits to decreased sensation.  Patient underwent MRI of the brain which showed small focus of T2 prolongation in the anterior left insular region with minor gliosis related to prior ischemic/infectious/inflammatory process, chronic microhemorrhage in the anterior left temporal cortical white matter, MRA angio unremarkable.    Subjective   Past Medical History:     Past Medical History:   Diagnosis Date    Abnormal vision     glasses    Anxiety      Attention deficit hyperactivity disorder (ADHD)     Depression     Difficulty walking     uses cane at times    Generalized weakness 2014    in left leg since 2014    Mitral valve prolapse     Neurological abnormality     Non-rheumatic mitral valve disease     Numbness     TIA (transient ischemic attack)     in 2021 per PT       Past Surgical History:     Past Surgical History:   Procedure Laterality Date    RECONSTRUCTION, FACIAL  2014    left side near eye brow    RECONSTRUCTION, MYOCUTANEOUS FLAP  02/17/2023    Procedure: RECONSTRUCTION, FASCIOCUTANEOUS FLAP, LEFT LEG;  Surgeon: Lorene Dy, DO;  Location: [facility];  Service: General;;    RELEASE, LOWER EXTREMITY CONTRACTURE  02/17/2023    Procedure: RELEASE, LOWER EXTREMITY CONTRACTURE;  Surgeon: Lorene Dy, DO;  Location: [facility];  Service: General;;    REPAIR, FASCIAL DEFECT Left 02/17/2023    Procedure: REPAIR, FASCIAL DEFECT LEFT LEG;  Surgeon: Lorene Dy, DO;  Location: [facility];  Service: General;  Laterality: Left;    TONSILLECTOMY      as child       Family History:     Family History   Problem Relation Age of Onset    Malignant hyperthermia Neg Hx  Anesthesia problems Neg Hx        Social History:     Social History     Socioeconomic History    Marital status: Single     Spouse name: Not on file    Number of children: Not on file    Years of education: Not on file    Highest education level: Not on file   Occupational History    Not on file   Tobacco Use    Smoking status: Never     Passive exposure: Never    Smokeless tobacco: Never   Vaping Use    Vaping status: Never Used   Substance and Sexual Activity    Alcohol use: Yes     Comment: once monthly per PT    Drug use: No     Comment: marijuana 1-2x a month (edibles)    Sexual activity: Not on file   Other Topics Concern    Not on file   Social History Narrative    Not on file     Social Determinants of Health     Financial Resource Strain: Not on file   Food Insecurity: No Food  Insecurity (03/11/2023)    Hunger Vital Sign     Worried About Running Out of Food in the Last Year: Never true     Ran Out of Food in the Last Year: Never true   Transportation Needs: No Transportation Needs (03/11/2023)    PRAPARE - Therapist, art (Medical): No     Lack of Transportation (Non-Medical): No   Physical Activity: Unknown (02/19/2023)    Exercise Vital Sign     Days of Exercise per Week: Patient declined     Minutes of Exercise per Session: Not on file   Stress: Not on file   Social Connections: Not on file   Intimate Partner Violence: Not At Risk (03/11/2023)    Humiliation, Afraid, Rape, and Kick questionnaire     Fear of Current or Ex-Partner: No     Emotionally Abused: No     Physically Abused: No     Sexually Abused: No   Housing Stability: Unknown (03/11/2023)    Housing Stability Vital Sign     Unable to Pay for Housing in the Last Year: No     Number of Places Lived in the Last Year: Not on file     Unstable Housing in the Last Year: No       Allergies:     Allergies   Allergen Reactions    Shellfish-Derived Products Anaphylaxis    Latex Rash     And swelling          Objective   Physical Examination   Vitals Temp:  [98.1 F (36.7 C)-98.5 F (36.9 C)]   Heart Rate:  [60-107]   Resp Rate:  [0-35]   BP: (110-155)/(60-91)   SpO2:  [93 %-98 %]   Height:  [177.8 cm (5\' 10" )]   Weight:  [108.1 kg (238 lb 5.1 oz)-120.2 kg (264 lb 15.9 oz)]   BMI (calculated):  [34.2-38]  // Temperature with 24 range  Vent Settings      General: Male patient in no acute distress    Neuro:    Alert times oriented x 3, sensation intact upper and lower extremity, decreased on the left lower extremity, strength right upper and lower extremity 5 out of 5, left upper extremity 5 out of 5, left lower extremity 1  out of 5    Lungs:   clear to auscultation, no wheezes, rales or rhonchi, symmetric air entry, no tachypnea, retractions or cyanosis     Cardiac:   normal rate, regular rhythm, normal S1, S2, no  murmurs, rubs, clicks or gallops     Abdomen:    soft, nontender, nondistended, no masses or organomegaly  bowel sounds normal     Extremities:   peripheral pulses normal no pedal edema no clubbing or cyanosis no ulcers, gangrene or atrophic changes    Skin:     Cool and dry    Assessment   Kurt Rogers is a 32 y.o. male with history of ADHD, anxiety and depression, reported prior episode of left-sided weakness about a year ago, now presenting with acute onset of left sided weakness and hemisensory loss with initial NIHSS of 7, last known well 1230 on 03/10/2023, status post TNK at 1357 for suspected ischemic stroke, now admitted to [facility] ICU for further care and neuromonitoring.      Patient has BMI=Body mass index is 34.2 kg/m.  Diagnosis: Obesity based on BMI criteria            Neuro   Possible acute CVA  Possible conversion disorder    Cardiovascular   Cardiac High Impact Dx: None (can delete this section)    Pulmonary  Pulm High Impact Dx: none    Renal  Renal High Impact Dx:     Infections Disease  Infectious Disease High Impact Dx: None    Hematology  Heme High Impact Dx: None (you can delete this heading)    Endo/Rheum  Endo High Impact: None    ICU Checklist  Sedation:   CAM-ICU: Positive or Negative for Delirium: Negative   CAM ICU:   Last Documented RASS: RASS Score: Alert and Calm   Currently ordered infusions:    niCARdipine      Reviewed: Yes   Mobility:   Current Mobility Level: PMP Activity: Step 5 - Chair (03/11/2023 12:00 PM)    Current PT Order: Yes  Current OT Order: Yes Reviewed: Yes   Respiratory (n/a if blank):   Ventilator Time:    Last Recorded Vent Mode:    Reviewed: Yes   Gastrointenstinal  Last Bowel Movement:   Last BM Date:  (PTA) Reviewed: Yes   CAUTI Prevention (n/a if blank):  Foley Day:        Reviewed: Yes   Blood Steam Infection Prevention (n/a if blank):    Reviewed: Yes   DVT Chemoprophylaxis (none if blank):    Reviewed: Yes            Plan     NEUROLOGICAL:   #Stroke  symptoms s/p thrombolysis, MRI negative, suspect possible conversion disorder?  #History of anxiety/depression  #History of bipolar disorder  #History of attention deficit hyperactivity disorder  #Possibly history of multiple sclerosis vs. conversion disorder  #History of left frontal T2 FLAIR hyperintensities on prior MRI  -CTH, CTA head/neck and CTP negative  -MRI unremarkable for acute stroke, MRA head and neck unremarkable  -Neurochecks every 4 hourly  -Repeat CT head negative for bleed  -Target blood pressure less than 140  -Stroke workup: Lipid panel unremarkable, HbA1c unimpressive, TSH unimpressive, urine drug screen positive for amphetamines only, TTE with bubble study pending read  -Discontinue statin  -No anticoagulants or antiplatelet agents given that does not appear to be acute stroke  -Neurology consulted, input appreciated  -Avoid benzodiazepines and sedatives  -Tylenol  as needed for pain/fever  -Resume home psych meds  -Psych consulted to further evaluate  -Passed SLP  -PT/OT, OOB to chair as indicated/tolerated  -Delirium precautions including maintenance of day/night wake cycles, frequent reorientation as needed  -Differential also includes demyelinating disease.  Per chart review, patient has had prior similar episodes of acute left-sided weakness, most recently July 2023 and prior episodes dating back to February 2022.  He has reportedly undergone extensive neurologic workup by neurologist Dr. Lanette Hampshire at [facility], including MRI brain February 2022 showing left frontal signal abnormalities, as well as LP with CSF studies reportedly negative for MS.  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  -Troponin negative, no chest pain or clinical s/s of ACS.  -ECG with NSR, normal axis, S wave in lead I, Q wave and  inverted T-wave in Lead 3 which were present in prior tracings dating back to August 2021.  Nonspecific ST changes with no evidence to suggest ischemia.  -TTE with bubble pending    PULMONARY:   No issues, oxygenating well on room air  Supplemental O2 as needed for goal SpO2 >92%    RENAL:   No acute issues, electrolytes benign  Daily BMP, replace lytes per protocol  Monitor UOP    GASTROINTESTINAL:  #At risk for constipation  Nutrition: Continue diet as tolerated  Bowel Regimen with nightly Peri-Colace  GI ppx: Not indicated    INFECTIOUS DISEASE:   No acute infectious concerns  Covid and flu negative    HEMATOLOGY/ONCOLOGY:   No acute issues  - VTE ppx: DVT prophylaxsis    ENDOCRONOLOGY/RHEUMATOLOGY:   No acute issues  Glucose goal 100-180    Transfer to medsurg, d/w hospitalist     I have personally assessed the patient and based my assessment and medical decision-making on a review of the patient's history and 24-hour interval events along with medical records, physical examination, vital signs, analysis of recent laboratory results, evaluation of radiology images, and monitoring data. The findings and plan of care was discussed with the care team.      Total care time: 60 minutes    Cline Cools, MD  03/11/2023 2:27 PM

## 2023-03-11 NOTE — PT Eval Note (Signed)
Physical Therapy Eval and Treatment  Kurt Rogers      Post Acute Care Therapy Recommendations   Discharge Recommendations:  Acute Rehab    DME needs IF patient is discharging home: Front wheel walker    Therapy discharge recommendations may change with patient status.  Please refer to most recent note for up-to-date recommendations.    Unit: [facility]  Bed: A2710/A2710-01    ___________________________________________________    Time of Evaluation and Treatment:  Time Calculation   PT Received On: 03/11/23  Start Time: 1228  Stop Time: 1255  Time Calculation (min): 27 min       Evaluation Time: 17 minutes  Treatment Time: 10 minutes    Chart Review and Collaboration with Care Team: 10 minutes, not included in above time    PT Visit Number: 1    Consult received for Kurt Rogers for PT Evaluation and Treatment.  Patient's medical condition is appropriate for Physical therapy intervention at this time.    Activity Orders:  PT eval and treat    Precautions and Contraindications:  Precautions  Weight Bearing Status: no restrictions  Fall Risks: High, Impaired balance/gait, Impaired mobility, Muscle weakness  Other Precautions: Aspiration precautions,  Bleeding precautions,  Fall precautions,  Seizure precautions,  Skin and pressure ulcer precautions    Personal Protective Equipment (PPE)  gloves and procedure mask    Medical Diagnosis:  Left-sided weakness [R53.1]    History of Present Illness:  Kurt Rogers is a 32 y.o. 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  Presented yesterday with left sided symptoms including weakness, numbness, and tremor.     S/p IV-TNK given possible seizure though would be very unusual in such a young pt without risk factors.  Upon further questioning, pt has had 2-3 such prior events though never with a tremor.  Symptoms are now markedly improved.  Must consider psychogenic etiology in light of negative prior work ups, sudden onset symptoms, and pt's failure to discuss fully his other events with me when TNK was being considered. (He did though refer to left leg weakness occurring about 1 year ago).  Consider also hemiplegic migraine without headache.  Consider highly atypical seizure disorder.     -  MRI brain is essentially negative.  Barely seen WM changes L frontal lobe which would not cause left sided symptoms and are not acute." (Neuro)    Patient Active Problem List   Diagnosis    Left-sided weakness    Mitral valve disease       Past Medical/Surgical History:  Past Medical History:   Diagnosis Date    Abnormal vision     glasses    Anxiety     Attention deficit hyperactivity disorder (ADHD)     Depression     Difficulty walking     uses cane at times    Generalized weakness 2014    in left leg since 2014    Mitral valve prolapse     Neurological abnormality     Non-rheumatic mitral valve disease     Numbness     TIA (transient ischemic attack)     in 2021 per PT     Past Surgical History:   Procedure Laterality Date    RECONSTRUCTION, FACIAL  2014    left side near eye brow    RECONSTRUCTION, MYOCUTANEOUS FLAP  02/17/2023    Procedure: RECONSTRUCTION,  FASCIOCUTANEOUS FLAP, LEFT LEG;  Surgeon: Lorene Dy, DO;  Location: [facility];  Service: General;;    RELEASE, LOWER EXTREMITY CONTRACTURE  02/17/2023    Procedure: RELEASE, LOWER EXTREMITY CONTRACTURE;  Surgeon: Lorene Dy, DO;  Location: [facility];  Service: General;;    REPAIR, FASCIAL DEFECT Left 02/17/2023    Procedure: REPAIR, FASCIAL DEFECT LEFT LEG;  Surgeon: Lorene Dy, DO;  Location: [facility];  Service: General;  Laterality: Left;    TONSILLECTOMY      as child       X-Rays/Tests/Labs:  Lab Results   Component Value Date/Time    HGB 15.0 03/11/2023 03:15 AM    HCT 43.7 03/11/2023 03:15 AM    K 4.2 03/11/2023 03:10 AM    NA 141 03/11/2023 03:10 AM    INR 1.1 03/11/2023 03:10 AM     TROPI <2.7 03/11/2023 03:10 AM    TROPI <2.7 03/10/2023 05:32 PM    TROPI Unable toCalc. 03/10/2023 05:32 PM    TROPI <2.7 03/10/2023 02:29 PM    TROPI <2.7 02/19/2023 03:09 AM       All imaging reviewed, please see chart for details.    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 Nursing clears patient for therapy.  Patient Goal: to walk again  Pain Assessment  Pain Assessment: No/denies pain      Objective:  Observation of Patient/Vital Signs:  Blood pressure 132/71, pulse 96, temperature 98.5 F (36.9 C), temperature source Axillary, resp. rate 22, height 1.778 m (5\' 10" ), weight 108.1 kg (238 lb 5.1 oz), SpO2 94%.      Inspection/Posture: obesity, L HP    Cognitive Status and Neuro Exam:  Cognition/Neuro Status  Arousal/Alertness: Appropriate responses to stimuli  Attention Span: Appears intact  Orientation Level: Oriented X4    Musculoskeletal Examination  Gross ROM  Right Lower Extremity ROM: within functional limits  Left Lower Extremity ROM:  (No AROM except toes (can wigle);)  Gross  Strength  Right Lower Extremity Strength: 5/5  Left Lower Extremity Strength: 1/5 (mm twitch noted)  Tone  Tone: needs focused assessment  RLE Tone: Within Functional Limits  LLE Tone: Flaccidity    Functional Mobility:  Functional Mobility  Supine to Sit: Minimal Assist;Increased Time;Increased Effort;to Right (w/ LLE)  Scooting to EOB: Stand by Assist;Increased Time;Increased Effort;to Right  Sit to Stand: Stand by Assist;Increased Time;Increased Effort;bed elevated;with instruction for hand placement to increase safety  Transfers  Bed to Chair: Unable to assess (Comment) (unstedy on feet, high risk of fall)  Locomotion  Ambulation:  Minimal Assist;with front-wheeled walker (60ft)  Pattern: L foot decreased clearance;L foot drop;decreased cadence  Stair Management: unable to perform (comment) (unsteady on feet, high risk of fall)     Balance  Balance  Balance: needs focused assessment  Sitting - Static: Good  Sitting - Dynamic: Fair  Standing - Static: Fair  Standing - Dynamic: Poor    Participation and Activity Tolerance  Participation and Endurance  Participation Effort: good  Endurance: Tolerates 10 - 20 min exercise with multiple rests  [place] Dyspnea Scale: 0 Dyspnea      Assessment:  Kurt Rogers is a 32 y.o. male admitted 03/10/2023.  PT Assessment  Assessment: Decreased LE ROM;Decreased LE strength;Decreased functional mobility;Decreased balance;Gait impairment  Prognosis: Good;With continued PT status post acute discharge  Progress: Improving as expected      Treatment:  Functional Mobility:  Sit to Supine: Minimal Assist, Increased Time, Increased Effort, to Left, and other (comment) w/ task segmentation, used RLE to assist w/ LLE and Stand to Sit: Stand by Assist, Increased Time, Increased Effort, and other (comment) eccentric trunk control    Balance:  Sitting Balance: sitting reaching activities, with instruction, without support, supervision, and stand by assist and Standing Balance:  patient education, standing weight shifting all planes, with instruction, with support, contact guard assist, and minimal assist    Locomotion:  Ambulation: Minimal Assist, front-wheeled walker, and Performed additional 2 feet and Pattern: L foot decreased clearance, L foot drop, decreased cadence, and decreased step length    Therapeutic Exercise:  RLE AROM; LLE PROM: Straight Leg Raise 3, Heelslides 3, Hip Abduction 3, Knee AROM 3, and Ankle Pumps 3    Education/Instruction/Safety:  Pt required increased time/effort and instruction for improved safety and technique t/o session.   All OOB activities and ambulation performed with use of gait belt for increased pt and therapist safety.    Discussed therapy plan of care while in the hospital and discharge recommendation/DME with pt verbalized agreement.   Encouraged continued functional mobility as tolerated with nursing staff assistance; pt verbalized understanding and agreement.  Educated the Patient to role of physical therapy, plan of care, goals of therapy and safety with mobility and ADLs, discharge instructions with verbalized understanding  and demonstrated understanding.    Patient left in bed with alarm and all other medical equipment in place and call bell and all personal items/needs within reach.  RN notified of session outcome. CM team and attending have been previously notified of d/c recommendation; no updates to d/c recommendation since that time.      Plan:  Treatment/Interventions: Exercise, Gait training, Stair training, Neuromuscular re-education, Functional transfer training, LE strengthening/ROM, Endurance training, Patient/family training, Equipment eval/education, Bed mobility, Compensatory technique education  PT Frequency: 3-4x/wk  Risks/Benefits/POC Discussed with Pt/Family: With patient    PMP Activity: Step 5 - Chair  Distance Walked (ft) (Step 6,7): 5 Feet      Goals:  Goals  Goal Formulation: With patient  Time for Goal Acheivement: By  time of discharge  Goals: Select goal  Pt Will Go Supine To Sit: with stand by assist, to maximize functional mobility and independence, Partly met  Pt Will Transfer Bed/Chair: with rolling walker, with stand by assist, to maximize functional mobility and independence  Pt Will Ambulate: 51-100 feet, with rolling walker, with stand by assist, to maximize functional mobility and independence  Pt Will Go Up / Down Stairs: 1 flight, with minimal assist, With rail, to maximize functional mobility and  independence  Pt Will Perform Home Exer Program: independent, to maximize functional mobility and independence      Rondel Baton, MS, [place]  Physical Therapist  [facility] [id number]  Monday 13:30 - 21:00  Tuesday, Wednesday 14:00-21:00  Thursday, Friday 10:30 - 21:00

## 2023-03-11 NOTE — Progress Notes (Signed)
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  Presented yesterday with left sided symptoms including weakness, numbness, and tremor.    S/p IV-TNK given possible seizure though would be very unusual in such a young pt without risk factors.  Upon further questioning, pt has had 2-3 such prior events though never with a tremor.  Symptoms are now markedly improved. Must consider psychogenic etiology in light of negative prior work ups, sudden onset symptoms, and pt's failure to discuss fully his other events with me when TNK was being considered. (He did though refer to left leg weakness occurring about 1 year ago).  Consider also hemiplegic migraine without headache.  Consider highly atypical seizure disorder.    -  MRI brain is essentially negative.  Barely seen WM changes L frontal lobe which would not cause left sided symptoms and are not acute.  - Pt reportedly with extensive work up at [facility] in the past without a firm conclusion regarding his events.  - OK to [place].  No need for antiplatelet meds or AC in light of recurrent events with negative work up.  -  f/u with his prior neurologists ([facility] associated)    Subjective:   Pt feeling much improved. Almost back to normal, he feels.  No associated headache with this event.    Review of Systems:   No headache, eye, ear nose, throat problems; no coughing or wheezing or shortness of breath, No chest pain or orthopnea, no abdominal pain, nausea or vomiting, No pain in the body or extremities, no psychiatric, neurological, endocrine, hematological or cardiac complaints except as noted above.     Physical Exam:   Blood pressure 125/70, pulse 63, temperature 98.1 F (36.7 C), temperature source Oral, resp. rate 21, height 1.778 m (5\' 10" ), weight 108.1 kg (238 lb 5.1 oz),  SpO2 98%.    HEENT: Normocephalic. No icter or congestion  Neck: supple, no lymphadenopathy, no thyromegaly, no JVD  Extremities: no clubbing, cyanosis, or edema  Skin: no rashes or lesions noted    Neuro:  Level of consciousness:  Alert and appropriate  Facial Movements: symmetric  Sensation to light touch: Intact bilaterally, but perhaps slight different left arm (LT) compared with R arm.      Meds:      Scheduled Meds: PRN Meds:    amphetamine-dextroamphetamine, 10 mg, Oral, BID  atorvastatin, 40 mg, Oral, QHS  lithium, 300 mg, Oral, TID  senna-docusate, 1 tablet, Oral, QHS        Continuous Infusions:   dexmedeTOMIDine Stopped (03/10/23 2120)    niCARdipine      calcium GLUConate, 1 g, PRN  hydrALAZINE, 10 mg, Q3H PRN  labetalol, 10 mg, Q15 Min PRN  magnesium oxide, 400-800 mg, PRN   Or  magnesium sulfate, 1 g, PRN  niCARdipine, 0-15 mg/hr, Continuous PRN  potassium chloride, 0-60 mEq, PRN   Or  potassium chloride, 0-60 mEq, PRN   Or  potassium chloride, 10 mEq, PRN  sodium phosphates 15 mmol in dextrose 5 % 250 mL IVPB, 15 mmol, PRN  sodium phosphates 25 mmol in dextrose 5 % 250 mL IVPB, 25 mmol, PRN  sodium phosphates 35 mmol in dextrose 5 % 250 mL IVPB, 35 mmol, PRN  I personally reviewed all of the medications    Labs:     Recent Labs   Lab 03/11/23  0310 03/10/23  1732 03/10/23  1429   Glucose 102* 90 88   BUN 14.0 12.0 15.0   Creatinine 1.1 0.9 1.0   Calcium 9.6 9.6 10.1   Sodium 141 139 141   Potassium 4.2 3.9 4.1   Chloride 106 106 104   CO2 24 23 26    Albumin 3.9 4.2 4.8   Phosphorus 5.5*  --   --    Magnesium 2.3  --   --    AST (SGOT)  --  28 33   ALT  --  65* 78*   Bilirubin, Total  --  0.6 0.6   Alkaline Phosphatase  --  72 82     Recent Labs   Lab 03/11/23  0315 03/10/23  1732 03/10/23  1429   WBC 8.91 12.20* 10.91*   Hgb 15.0 15.6 17.1   Hematocrit 43.7 44.8 48.9   MCV 79.2 78.0 79.1   MCH 27.2 27.2 27.7   MCHC 34.3 34.8 35.0   Platelets 352* 411* 469*         Recent Labs      03/11/23  0310 03/10/23  1429   PTT 34 34   PT 12.6 12.1   PT INR 1.1 1.0          Radiology Results (24 Hour)       Procedure Component Value Units Date/Time    US Venous Low Extrem Duplx Dopp Comp Bilat [[id number]] Resulted: 03/11/23 0831    Order Status: Sent Updated: 03/11/23 0837    MR Angiogram Head WO Contrast [[id number]] Collected: 03/10/23 2205    Order Status: Completed Updated: 03/10/23 2212    Narrative:      HISTORY: Left-sided weakness.    COMPARISON: Correlation with a brain MRI performed at the same time.    TECHNIQUE:  MR angiogram of the head and neck performed without contrast.  MR angiogram of the neck performed without and with intravenous contrast.  Examinations performed on a Tesla scanner. 3D maximum intensity projection  (MIP) images were created and reviewed. Internal carotid stenosis was  determined based on NASCET criteria.    CONTRAST: gadoterate meglumine (CLARISCAN) 10 MMOL/20ML injection 20 mL    FINDINGS:  The great vessels originate from the aortic arch.    The right common carotid artery is patent. There is no stenosis of the  proximal right internal carotid artery. The more distal cervical right  internal carotid artery is patent.  The intracranial right internal carotid  artery is patent.    The left common carotid artery is patent.  There is no stenosis of the  proximal left internal carotid artery.  The more distal cervical left  internal carotid artery is patent.  The intracranial left internal carotid  artery is patent.    The vertebral arteries are codominant. The cervical vertebral arteries are  patent. The intracranial vertebral arteries are patent. The basilar artery  is patent and normal in caliber.      The proximal visualized portions of the anterior cerebral, middle cerebral,  and posterior cerebral arteries are patent.      Impression:        1.There is no stenosis of the proximal right internal carotid artery based  on NASCET criteria.    2.There is no stenosis of  the proximal left internal carotid artery based  on NASCET criteria.  3.The vertebrobasilar arterial system is patent.  4.Normal intracranial MR angiogram.    Terrilee Croak, MD  03/10/2023 10:10 PM    MR Angiogram Neck W WO Contrast [[id number]] Collected: 03/10/23 2205    Order Status: Completed Updated: 03/10/23 2212    Narrative:      HISTORY: Left-sided weakness.    COMPARISON: Correlation with a brain MRI performed at the same time.    TECHNIQUE:  MR angiogram of the head and neck performed without contrast.  MR angiogram of the neck performed without and with intravenous contrast.  Examinations performed on a Tesla scanner. 3D maximum intensity projection  (MIP) images were created and reviewed. Internal carotid stenosis was  determined based on NASCET criteria.    CONTRAST: gadoterate meglumine (CLARISCAN) 10 MMOL/20ML injection 20 mL    FINDINGS:  The great vessels originate from the aortic arch.    The right common carotid artery is patent. There is no stenosis of the  proximal right internal carotid artery. The more distal cervical right  internal carotid artery is patent.  The intracranial right internal carotid  artery is patent.    The left common carotid artery is patent.  There is no stenosis of the  proximal left internal carotid artery.  The more distal cervical left  internal carotid artery is patent.  The intracranial left internal carotid  artery is patent.    The vertebral arteries are codominant. The cervical vertebral arteries are  patent. The intracranial vertebral arteries are patent. The basilar artery  is patent and normal in caliber.      The proximal visualized portions of the anterior cerebral, middle cerebral,  and posterior cerebral arteries are patent.      Impression:        1.There is no stenosis of the proximal right internal carotid artery based  on NASCET criteria.    2.There is no stenosis of the proximal left internal carotid artery based  on NASCET criteria.  3.The  vertebrobasilar arterial system is patent.  4.Normal intracranial MR angiogram.    Terrilee Croak, MD  03/10/2023 10:10 PM    MRI Brain W WO Contrast [[id number]] Collected: 03/10/23 2201    Order Status: Completed Updated: 03/10/23 2207    Narrative:      HISTORY: Left-sided weakness, numbness, tremors.    COMPARISON: None available.    TECHNIQUE: MRI of the brain performed on a 1.5 Tesla scanner without and  with intravenous contrast.    CONTRAST: gadoterate meglumine (CLARISCAN) 10 MMOL/20ML injection 20 mL    FINDINGS:   Small foci of T2 prolongation are seen in the anterior left subinsular  region. These are nonspecific. Such finding can be seen in patients with  chronic migraines. Gliosis related to a prior ischemic, infectious or  inflammatory injury could appear similar. There is no mass effect, acute  infarction, MR evidence of an acute intracranial hemorrhage, pathological  parenchymal or meningeal enhancement. A punctate susceptibility is present  in the anterior left temporal subcortical white matter consistent with a  chronic microhemorrhage.    The ventricular system and cisterns are normally configured. The major  vascular flow voids are normally maintained. There is minor mucosal  thickening in the ethmoid air cells.      Impression:         1.Small foci of T2 prolongation in the anterior left subinsular region also  with minor gliosis related to a prior ischemic, infectious inflammatory  process.  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    Terrilee Croak, MD  03/10/2023 10:05 PM    CT Head WO Contrast [[id number]] Collected: 03/10/23 1858    Order Status: Completed Updated: 03/10/23 1901    Narrative:      HISTORY: Stroke.    COMPARISON: Brain CT performed earlier the same day.     TECHNIQUE: CT of the head performed without intravenous contrast. The  following dose reduction techniques were utilized: automated exposure  control and/or  adjustment of the mA and/or KV according to patient size,  and the use of an iterative reconstruction technique.    CONTRAST: None.    FINDINGS:  The ventricular system and cisterns are normally configured. There is no  mass or an acute intracranial hemorrhage. The visualized paranasal sinuses  appear well-aerated. The calvarium is intact.      Impression:         1.Normal examination.    Terrilee Croak, MD  03/10/2023 6:58 PM    CT Angiogram Head Neck [[id number]] Collected: 03/10/23 1558    Order Status: Completed Updated: 03/10/23 1606    Narrative:      HISTORY: Weakness left    COMPARISON: 12/02/2021 CT angiogram neck.  TECHNIQUE:  CT angiogram of the head and neck and CT perfusion of the head  performed with intravenous contrast. Multiplanar reformatted and 3D maximum  intensity projection (MIP) images were created and reviewed. Images  analyzed utilizing Viz ContaCT AI algorithm for LVO detection and computer  aided triage. VIZ CT Perfusion analysis was performed and multiple relative  perfusion maps were created. Proximal internal carotid artery narrowing was  determined utilizing NASCET methodology. The following dose reduction  techniques were utilized: automated exposure control and/or adjustment of  the mA and/or KV according to patient size, and the use of an iterative  reconstruction technique.  CONTRAST: iohexol (OMNIPAQUE) 350 MG/ML injection 100 mL  FINDINGS:      CT perfusion:  rCBF<30% images show 0 cc.  Tmax>6sec images show 0 cc.    CT angiogram head and neck:  The right common carotid artery is patent. There is no stenosis of the  proximal right internal carotid artery. The distal and intracranial right  internal carotid artery is patent.  The right anterior and middle cerebral arteries are patent.    The left common carotid artery is patent.  There is no stenosis of the  proximal left internal carotid artery. The distal and intracranial left  internal carotid artery is patent.  The left  anterior and middle cerebral arteries are patent.    The vertebral arteries are codominant. The cervical vertebral arteries are  patent.   The intracranial vertebral, basilar, and posterior cerebral arteries are  patent.      Impression:        1.rCBF<30% images show 0 cc. Tmax>6sec images show 0 cc.  2.The intracranial arteries are patent, no proximal intracranial large  vessel occlusion.  3.There is no stenosis of the proximal right internal carotid artery based  on NASCET criteria.  4.There is no stenosis of the proximal left internal carotid artery based  on NASCET criteria.       Alex Einar Pheasant, MD  03/10/2023 4:04 PM    CT Angiogram Cerebral Perfusion W 3D Reconstruction [[id number]] Collected: 03/10/23 1558    Order Status: Completed Updated: 03/10/23 1606    Narrative:      HISTORY: Weakness left    COMPARISON: 12/02/2021 CT angiogram neck.  TECHNIQUE:  CT angiogram of the head and neck and CT perfusion of the head  performed with intravenous contrast. Multiplanar reformatted and 3D maximum  intensity projection (MIP) images were created and reviewed. Images  analyzed utilizing Viz ContaCT AI algorithm for LVO detection and computer  aided triage. VIZ CT Perfusion analysis was performed and multiple relative  perfusion maps were created. Proximal internal carotid artery narrowing was  determined utilizing NASCET methodology. The following dose reduction  techniques were utilized: automated exposure control and/or adjustment of  the mA and/or KV according to patient size, and the use of an iterative  reconstruction technique.  CONTRAST: iohexol (OMNIPAQUE) 350 MG/ML injection 100 mL  FINDINGS:      CT perfusion:  rCBF<30% images show 0 cc.  Tmax>6sec images show 0 cc.    CT angiogram head and neck:  The right common carotid artery is patent. There is no stenosis of the  proximal right internal carotid artery. The distal and intracranial right  internal carotid artery is patent.  The right anterior and  middle cerebral arteries are patent.    The left common carotid artery is patent.  There is no stenosis of the  proximal left internal carotid artery. The distal and intracranial left  internal carotid artery is patent.  The left anterior and middle cerebral arteries are patent.    The vertebral arteries are codominant. The cervical vertebral arteries are  patent.   The intracranial vertebral, basilar, and posterior cerebral arteries are  patent.      Impression:        1.rCBF<30% images show 0 cc. Tmax>6sec images show 0 cc.  2.The intracranial arteries are patent, no proximal intracranial large  vessel occlusion.  3.There is no stenosis of the proximal right internal carotid artery based  on NASCET criteria.  4.There is no stenosis of the proximal left internal carotid artery based  on NASCET criteria.       Alex Einar Pheasant, MD  03/10/2023 4:04 PM    CT Head without Contrast [[id number]] Collected: 03/10/23 1348    Order Status: Completed Updated: 03/10/23 1402    Narrative:      HISTORY: Weakness.     COMPARISON: CT of the brain dated 12/02/2021.    TECHNIQUE: CT of the head performed without intravenous contrast. The  following dose reduction techniques were utilized: automated exposure  control and/or adjustment of the mA and/or KV according to patient size,  and the use of an iterative reconstruction technique.    CONTRAST: None.    FINDINGS:  There is no evidence of acute territorial infarction or intracranial  hemorrhage. The ventricles and sulci are normal in size and configuration.  There is no mass effect or midline shift. No extra-axial collections are  seen. The globes and orbits appear unremarkable. The visualized paranasal  sinuses and mastoid air cells are clear.      Impression:       No acute intracranial abnormality.    Georgann Housekeeper, MD  03/10/2023 2:00 PM             All recent brain and spine imaging (MRI, CT) results reviewed.    Code status listed in chart confirmed    The notes from the last  24 hours were reviewed.     Case discussed with: patient and Dr. Bonney Leitz    35 minutes;  involving time spent examining patient, in counseling or coordination of care, reviewing test results, and in documentation.  Signed by: Merceda Elks, MD  [place]: [phone]       Answering Service: [phone]

## 2023-03-11 NOTE — Progress Notes (Signed)
Initial Case Management Assessment and Discharge Planning [facility]   Patient Name: Kurt Rogers, Kurt Rogers   Date of Birth November 11, 1991   Attending Physician: Terrence Dupont, DO   Primary Care Physician: Octavia Bruckner, MD   Length of Stay 0   Reason for Consult / Chief Complaint Initial assessment        Situation   Admission DX:   1. Left-sided weakness      A/O Status: X 3    LACE Score: 5    Patient admitted from: ER  Admission Status: observation     Background   Advanced directive:   <no information>    Code Status:   Full Code     Residence: House, lives with 3 roommates    PCP: Octavia Bruckner, MD  Patient Contact:   [phone] (home) [phone] (work)    [phone] (mobile)   Emergency contact:   Extended Emergency Contact Information  Primary Emergency Contact: Murray Hodgkins [place]  Home Phone: [phone]  Mobile Phone: [phone]  Relation: Father   ADL/IADL's: Independent  Previous Level of function: 7 Independent     DME: Single point cane  Grab bars    Pharmacy:     [id number] [place], [place] - [address] [facility] [address]  [address] [place] [address]  Phone: [phone] Fax: [phone]    Prescription Coverage: Yes    Home Health: The patient is not currently receiving home health services.    Previous SNF/AR: none    Transport for discharge? Depending on needs  Agreeable to Acute Rehab: TBD  post-discharge:  Yes     Assessment   Assessment completed with the patient at bedside. Face Sheet information verified. No history of PT/OT services in the past. Patient is aware of initial PT and OT recommendation for Acute rehab at discharge. He is open to this if symptoms do not improve/resolve. List given to patient.  BARRIERS TO DISCHARGE: medical stability, will need insurance auth for AR     Recommendation   D/C Plan A: Acute Rehab  D/C Plan B: Home with home health/outpatient therapy

## 2023-03-11 NOTE — PT Eval Note (Signed)
[facility]  Physical Therapy Attempt Note    Patient:  Kurt Rogers MRN#:  [id number]  Unit:  [facility] Room/Bed:  A2710/A2710-01      Physical Therapy evaluation attempted on 03/11/2023 at 11:07 AM.     PT Visit Cancellation Reason: Testing/Procedure ([place])         Physical therapy will follow up at a later time/date as able.     RN aware.   Rondel Baton, MS, MPT  Physical Therapist  [facility] [phone]  Monday 13:30 - 21:00  Tuesday, Wednesday 14:00-21:00  Thursday, Friday 10:30 - 21:00

## 2023-03-11 NOTE — H&P (Signed)
[facility]      Patient: Kurt Rogers  Date: 03/10/2023   DOB: 1991-06-14  Date of Admission: 03/10/2023   MRN: [id number]  Attending: Terrence Dupont, DO         Chief Complaint   Patient presents with    Tremors    Extremity Weakness        History Gathered From: Patient    HISTORY AND PHYSICAL     Kurt Rogers is a 32 y.o. male with a PMHx of unspecified mood disorder, anxiety, ADHD, depression who presented with left-sided weakness and tremor he reported that he has had left-sided weakness previously as well but the new onset of tremor made him worried which prompted his visit to the ER.  The similar episode happened about a year ago.  On arrival to the ED his initial NIHSS was reported to be 2.  CT showed NAD neurology was consulted and patient received TNK patient currently denies any chest pain shortness of breath blurry vision nausea vomiting.  He still reports some weakness on the left side but no tremor as well as a mild headache from dehydration.  MRI was obtained which was unremarkable and repeat CT head was stable.    Past Medical History:   Diagnosis Date    Abnormal vision     glasses    Anxiety     Attention deficit hyperactivity disorder (ADHD)     Depression     Difficulty walking     uses cane at times    Generalized weakness 2014    in left leg since 2014    Mitral valve prolapse     Neurological abnormality     Non-rheumatic mitral valve disease     Numbness     TIA (transient ischemic attack)     in 2021 per PT       Past Surgical History:   Procedure Laterality Date    RECONSTRUCTION, FACIAL  2014    left side near eye brow    RECONSTRUCTION, MYOCUTANEOUS FLAP  02/17/2023    Procedure: RECONSTRUCTION, FASCIOCUTANEOUS FLAP, LEFT LEG;  Surgeon: Lorene Dy, DO;  Location: [facility];  Service: General;;    RELEASE, LOWER EXTREMITY CONTRACTURE  02/17/2023    Procedure: RELEASE, LOWER EXTREMITY CONTRACTURE;  Surgeon: Lorene Dy, DO;  Location: [facility];  Service:  General;;    REPAIR, FASCIAL DEFECT Left 02/17/2023    Procedure: REPAIR, FASCIAL DEFECT LEFT LEG;  Surgeon: Lorene Dy, DO;  Location: [facility];  Service: General;  Laterality: Left;    TONSILLECTOMY      as child       Prior to Admission medications    Medication Sig Start Date End Date Taking? Authorizing Provider   albuterol sulfate HFA (PROVENTIL) 108 (90 Base) MCG/ACT inhaler Inhale 2 puffs into the lungs every 4 (four) hours as needed for Shortness of Breath Dispense with spacer 02/19/23 03/21/23  Juliane Poot, MD PhD   amphetamine-dextroamphetamine (ADDERALL) 10 MG tablet Take 1 tablet (10 mg) by mouth 2 (two) times daily    [provider]   busPIRone (BUSPAR) 5 MG tablet Take 1 tablet (5 mg) by mouth 2 (two) times daily    [provider]   hydrOXYzine (ATARAX) 25 MG tablet Take 1 tablet (25 mg) by mouth every 8 (eight) hours as needed for Itching 05/14/22 07/13/22  Tanney-Palmeter, Genene Churn, PA   lithium (LITHOBID) 300 MG CR tablet  Take 1 tablet (300 mg) by mouth 3 (three) times daily    [provider]       Allergies   Allergen Reactions    Shellfish-Derived Products Anaphylaxis    Latex Rash     And swelling       Family History   Problem Relation Age of Onset    Malignant hyperthermia Neg Hx     Anesthesia problems Neg Hx        Social History     Tobacco Use    Smoking status: Never     Passive exposure: Never    Smokeless tobacco: Never   Vaping Use    Vaping status: Never Used   Substance Use Topics    Alcohol use: Yes     Comment: once monthly per PT    Drug use: No     Comment: marijuana 1-2x a month (edibles)       REVIEW OF SYSTEMS   Positive for: Left-sided weakness, tremor, headache  Negative for: Blurry vision nausea vomiting diarrhea chest pain shortness of breath  All ROS completed and otherwise negative.    PHYSICAL EXAM     Vital Signs (most recent): BP 118/71   Pulse 91   Temp 97.5 F (36.4 C) (Oral)   Resp 22   Ht 1.778 m (5\' 10" )   Wt 108.1 kg (238  lb 5.1 oz)   SpO2 95%   BMI 34.20 kg/m   Constitutional: No apparent distress and Appears stated age. Patient speaks freely in full sentences.   HEENT: NC/AT, PERRL, no scleral icterus or conjunctival pallor, no nasal discharge, MMM, oropharynx without erythema or exudate  Neck: trachea midline, supple, no cervical or supraclavicular lymphadenopathy or masses  Cardiovascular: RRR, normal S1 S2, no murmurs, gallops, palpable thrills, no JVD, Non-displaced PMI.  Respiratory: Normal rate. No retractions or increased work of breathing. Clear to auscultation and percussion bilaterally.  Gastrointestinal: +BS, non-distended, soft, non-tender, no rebound or guarding, no hepatosplenomegaly  Genitourinary: no suprapubic or costovertebral angle tenderness  Musculoskeletal: ROM and motor strength grossly normal. No clubbing, edema, or cyanosis. DP and radial pulses 2+ and symmetric.  Neurologic: EOMI, CN 2-12 grossly intact.  Poor effort on exam, left-sided weakness lower extremity as well as upper extremity  Psychiatric: AAOx3, affect and mood appropriate. The patient is alert, interactive, appropriate.    LABS & IMAGING     Recent Results (from the past 24 hour(s))   CBC and differential    Collection Time: 03/10/23  5:32 PM   Result Value Ref Range    WBC 12.20 (H) 3.10 - 9.50 x10 3/uL    Hgb 15.6 12.5 - 17.1 g/dL    Hematocrit 56.3 87.5 - 49.6 %    Platelets 411 (H) 142 - 346 x10 3/uL    RBC 5.74 4.20 - 5.90 x10 6/uL    MCV 78.0 78.0 - 96.0 fL    MCH 27.2 25.1 - 33.5 pg    MCHC 34.8 31.5 - 35.8 g/dL    RDW 12 11 - 15 %    MPV 9.3 8.9 - 12.5 fL    Instrument Absolute Neutrophil Count 8.23 (H) 1.10 - 6.33 x10 3/uL    Neutrophils 67.4 None %    Lymphocytes Automated 23.9 None %    Monocytes 6.6 None %    Eosinophils Automated 1.2 None %    Basophils Automated 0.5 None %    Immature Granulocytes 0.4 None %    Nucleated  RBC 0.0 0.0 - 0.0 /100 WBC    Neutrophils Absolute 8.23 (H) 1.10 - 6.33 x10 3/uL    Lymphocytes Absolute  Automated 2.91 0.42 - 3.22 x10 3/uL    Monocytes Absolute Automated 0.80 0.21 - 0.85 x10 3/uL    Eosinophils Absolute Automated 0.15 0.00 - 0.44 x10 3/uL    Basophils Absolute Automated 0.06 0.00 - 0.08 x10 3/uL    Immature Granulocytes Absolute 0.05 0.00 - 0.07 x10 3/uL    Absolute NRBC 0.00 0.00 - 0.00 x10 3/uL   Type and Screen    Collection Time: 03/10/23  5:32 PM   Result Value Ref Range    ABO Rh B POS     AB Screen Gel NEG    Comprehensive metabolic panel    Collection Time: 03/10/23  5:32 PM   Result Value Ref Range    Glucose 90 70 - 100 mg/dL    BUN 04.5 9.0 - 40.9 mg/dL    Creatinine 0.9 0.5 - 1.5 mg/dL    Sodium 811 914 - 782 mEq/L    Potassium 3.9 3.5 - 5.3 mEq/L    Chloride 106 99 - 111 mEq/L    CO2 23 17 - 29 mEq/L    Calcium 9.6 8.5 - 10.5 mg/dL    Protein, Total 6.8 6.0 - 8.3 g/dL    Albumin 4.2 3.5 - 5.0 g/dL    AST (SGOT) 28 5 - 41 U/L    ALT 65 (H) 0 - 55 U/L    Alkaline Phosphatase 72 37 - 117 U/L    Bilirubin, Total 0.6 0.2 - 1.2 mg/dL    Globulin 2.6 2.0 - 3.6 g/dL    Albumin/Globulin Ratio 1.6 0.9 - 2.2    Anion Gap 10.0 5.0 - 15.0    eGFR >60.0 >=60 mL/min/1.73 m2   High Sensitivity Troponin-I at 2 hrs with calculated Delta    Collection Time: 03/10/23  5:32 PM   Result Value Ref Range    hs Troponin-I <2.7 SEE BELOW ng/L    hs Troponin-I Delta Unable toCalc. ng/L   Blood Type Confirmation    Collection Time: 03/10/23  5:32 PM   Result Value Ref Range    Blood Type Confirmation B POS    High Sensitivity Troponin-I at 0 hrs    Collection Time: 03/11/23  3:10 AM   Result Value Ref Range    hs Troponin-I <2.7 SEE BELOW ng/L   Lipid panel    Collection Time: 03/11/23  3:10 AM   Result Value Ref Range    Cholesterol 151 0 - 199 mg/dL    Triglycerides 956 34 - 149 mg/dL    HDL 28 (L) 40 - 2,130 mg/dL    LDL Calculated 96 0 - 99 mg/dL    VLDL Calculated 27 10 - 40 mg/dL    Cholesterol / HDL Ratio 5.4 See Below Index   Renal function panel    Collection Time: 03/11/23  3:10 AM   Result Value Ref  Range    Glucose 102 (H) 70 - 100 mg/dL    Sodium 865 784 - 696 mEq/L    Potassium 4.2 3.5 - 5.3 mEq/L    Chloride 106 99 - 111 mEq/L    CO2 24 17 - 29 mEq/L    BUN 14.0 9.0 - 28.0 mg/dL    Calcium 9.6 8.5 - 29.5 mg/dL    Creatinine 1.1 0.5 - 1.5 mg/dL    Albumin 3.9 3.5 - 5.0 g/dL  Phosphorus 5.5 (H) 2.3 - 4.7 mg/dL    Anion Gap 78.2 5.0 - 15.0    eGFR >60.0 >=60 mL/min/1.73 m2   APTT    Collection Time: 03/11/23  3:10 AM   Result Value Ref Range    PTT 34 27 - 39 sec   Prothrombin time/INR    Collection Time: 03/11/23  3:10 AM   Result Value Ref Range    PT 12.6 10.1 - 12.9 sec    PT INR 1.1 0.9 - 1.1   TSH, Abn Reflex to Free T4, Serum    Collection Time: 03/11/23  3:10 AM   Result Value Ref Range    TSH, Abn Reflex to Free T4, Serum 1.87 0.35 - 4.94 uIU/mL   Magnesium    Collection Time: 03/11/23  3:10 AM   Result Value Ref Range    Magnesium 2.3 1.6 - 2.6 mg/dL   CBC without differential    Collection Time: 03/11/23  3:15 AM   Result Value Ref Range    WBC 8.91 3.10 - 9.50 x10 3/uL    Hgb 15.0 12.5 - 17.1 g/dL    Hematocrit 95.6 21.3 - 49.6 %    Platelets 352 (H) 142 - 346 x10 3/uL    RBC 5.52 4.20 - 5.90 x10 6/uL    MCV 79.2 78.0 - 96.0 fL    MCH 27.2 25.1 - 33.5 pg    MCHC 34.3 31.5 - 35.8 g/dL    RDW 12 11 - 15 %    MPV 9.1 8.9 - 12.5 fL    Nucleated RBC 0.0 0.0 - 0.0 /100 WBC    Absolute NRBC 0.00 0.00 - 0.00 x10 3/uL   Hemoglobin A1C    Collection Time: 03/11/23  3:15 AM   Result Value Ref Range    Hemoglobin A1C 5.4 4.6 - 5.6 %    Average Estimated Glucose 108.3 mg/dL   Rapid drug screen, urine    Collection Time: 03/11/23  6:30 AM   Result Value Ref Range    Urine Amphetamine Screen Positive (A)     Barbiturate Screen, UR Negative     Benzodiazepine Screen, UR Negative     Cannabinoid Screen, UR Negative     Cocaine, UR Negative     Urine Fentanyl Negative     Opiate Screen, UR Negative     PCP Screen, UR Negative    Urinalysis Reflex to Microscopic Exam- Reflex to Culture    Collection Time: 03/11/23   6:30 AM    Specimen: Urine, Clean Catch   Result Value Ref Range    Urine Type Urine, Clean Ca     Color, UA Yellow     Clarity, UA Clear Clear - Hazy    Specific Gravity UA >1.050 (A) 1.001 - 1.035    Urine pH 6.0 5.0 - 8.0    Leukocyte Esterase, UA Negative Negative    Nitrite, UA Negative Negative    Protein, UR 30= 1+ (A) Negative    Glucose, UA Negative Negative    Ketones UA Negative Negative    Urobilinogen, UA Normal 0.2 - 2.0 mg/dL    Bilirubin, UA Negative Negative    Blood, UA Negative Negative    RBC, UA 0-2 0 - 5 /hpf    WBC, UA 0-5 0 - 5 /hpf    Squamous Epithelial Cells, Urine 0-5 0 - 25 /hpf    Urine Mucus Present None   COVID-19 (SARS-CoV-2) and Influenza A/B, NAA ([facility])-  Admission    Collection Time: 03/11/23  7:29 AM    Specimen: Nasopharyngeal; Culturette   Result Value Ref Range    Purpose of COVID testing Diagnostic -PUI     SARS-CoV-2 Specimen Source Nasal Swab     SARS CoV 2 Overall Result Not Detected     Influenza A Not Detected     Influenza B Not Detected    Lithium level    Collection Time: 03/11/23 11:50 AM   Result Value Ref Range    Lithium Level 0.360 (L) 1.000 - 1.200 mEq/L       MICROBIOLOGY:  Blood Culture: Not indicated  Urine Culture: Not indicated    CARDIAC:  EKG Interpretation (upon my review):      Markers:  Recent Labs   Lab 03/11/23  0310 03/10/23  1732   hs Troponin-I <2.7 <2.7   hs Troponin-I Delta  --  Unable toCalc.       EMERGENCY DEPARTMENT COURSE:  Orders Placed This Encounter   Procedures    COVID-19 (SARS-CoV-2) and Influenza A/B, NAA ([facility])- Admission    CT Head without Contrast    MRI Brain W WO Contrast    MR Angiogram Head WO Contrast    MR Angiogram Neck W WO Contrast    CT Head WO Contrast    CT Angiogram Head Neck    CT Angiogram Cerebral Perfusion W 3D Reconstruction    US Venous Low Extrem Duplx Dopp Comp Bilat    CT Head WO Contrast    CBC and differential    Comprehensive metabolic panel    Prothrombin time/INR    APTT    High Sensitivity  Troponin-I    Rapid drug screen, urine    Lipid panel    Renal function panel    CBC and differential    CBC without differential    APTT    Prothrombin time/INR    Hemoglobin A1C    Comprehensive metabolic panel    TSH, Abn Reflex to Free T4, Serum    Rapid drug screen, urine    High Sensitivity Troponin-I at 0 hrs    High Sensitivity Troponin-I at 2 hrs with calculated Delta    Urinalysis Reflex to Microscopic Exam- Reflex to Culture    Basic Metabolic Panel    Magnesium    Sedimentation rate (ESR)    C Reactive Protein    Magnesium    Lithium level    Adult diet Diet type: Therapeutic/ Modified; Texture/ liquid modification: Solid; Solid texture/consistency: Regular (IDDSI level 7); Liquid texture/consistency: Thin (IDDSI level 0)    Cardiac monitoring (ED ONLY)    Continuous Pulse Oximetry    Dysphagia Screen    NPO (Including Aspirin) until dysphagia screen done and passed    Nursing Stroke Protocol and NIHSS    Pupillometer Check    do NOT wait for creatinine/GFR prior to IV contrast administration for acute stroke neuroimaging    Glucose POC    Pupillometer Check    Vital signs    NIH Stroke Score    Vital signs first 24 hours (see comments)    NIHSS (per schedule in comments)    Intake and Output    Nursing swallow assessment    Daily weights    Activity as tolerated    Enter diet order if patient passes nursing swallow screen or per SLP recommendation    Place sequential compression device    Maintain sequential compression device    Stroke  Goals and Notifications    Goals: Sodium Parameters    Goals: Temperature Range    Goals: Intake and Output    Goals: Glasgow coma scale    Goals: Neuro Checks    No anticoagulants for 24 hours, then start antithrombotic as ordered    Education: Stroke    Initiate Stroke Parameters    Goals: Blood Pressure post IV thrombolysis (without IR intervention)    Hold Metformin containing medications    NPO before radiology test per Radiology protocol    Eye protection shoud be  worn for all instances of  patient contact    Lab draws/medication administration should be bundled to minimize exposure risks    Notify RN    Full Code    Inpatient consult to Neurology    Inpatient consult to Psychiatry    Contact isolation    Airborne isolation for COVID-19 with negative pressure    OT eval and treat    PT evaluate and treat    SLP eval and treat    SLP eval and treat    Glucose Whole Blood - POCT    ECG 12 lead    ECG 12 lead    Echocardiogram Adult Comp W Clr/Dop/Bubble - (For patients less than 36 yo)    Type and Screen    Blood Type Confirmation    Insert peripheral IV    Saline lock IV    Saline lock IV    Insert peripheral IV and lock (patent 18ga for Radiology test)    Adult Admit to Inpatient    Place for Observation Services    Transfer patient    Fall precautions    Bleeding precautions    Aspiration precautions    Seizure precautions    Skin and pressure ulcer precautions       ASSESSMENT & PLAN     Tabius Rood is a 32 y.o. male admitted with Left-sided weakness.    Patient Active Hospital Problem List:       Left-sided weakness (12/02/2021)           Assessment:         Plan:     Left-sided weakness/tremor status post thrombolysis, MRI negative, stroke rule out, possible suspected conversion disorder  Neurology on board  MRI unremarkable as well as MRA of head and neck unremarkable  Continue neurochecks every 4 hours  Lipid panel, hemoglobin A1c TSH unremarkable  Psych consulted for further evaluation suspecting conversion disorder  PT OT continued  History of anxiety and depression  Continue home psych medications  Mood disorder unspecified possibly bipolar  Continue home medications lithium  History of ADHD  Continue home medication                 Patient has BMI=Body mass index is 34.2 kg/m.  Diagnosis: Obesity based on BMI criteria        Nutrition: heart healthy    DVT/VTE Prophylaxis:   Current Facility-Administered Medications (Includes Only Anticoagulants, Misc.  Hematological)   Medication Dose Route Last Admin    heparin (porcine) injection 5,000 Units  5,000 Units Subcutaneous 5,000 Units at 03/11/23 1517       Code Status: Full Code    Patient Class: INPATIENT. Inpatient status is judged to be reasonable and necessary in order to provide the required intensity of service to ensure the patient's safety. The patient's presenting symptoms, physical exam findings, and initial radiographic and laboratory data in the context of their chronic  comorbidities is felt to place them at high risk for further clinical deterioration. Furthermore, it is not anticipated that the patient will be medically stable for discharge from the hospital within 2 midnights of admission. The following factors support the admission status of inpatient: the patient's presenting symptoms of weakness left-sided, worrisome physical exam findings of weakness upper and lower extremity, worrisome laboratory data of unremarkable, and significant comorbidities including mood disorder, anxiety, depression possible conversion disorder.    I certify that at the point of admission it is my clinical judgment that the patient will require inpatient hospital care spanning beyond 2 midnights from the point of admission due to high intensity of service, high risk for further deterioration and high frequency of surveillance required.     Anticipated medical stability for discharge: 24 - 48 Hours      Signed,  Terrence Dupont, DO    03/11/2023 4:21 PM  Time Elapsed: 45 mins

## 2023-03-11 NOTE — SLP Eval Note (Signed)
[facility]    Speech Therapy Treatment Note Attempt   Patient: Kurt Rogers MRN#: [id number]   Unit: [facility] Room/Bed: A2710/A2710-01         SLP Order Cancellation Reason: Not clinically indicated at this time (comment required) - Rehab decision     New order placed due to concerns for speech/language.  Per chart review, pt has been reporting he is back to baseline today.  Went to see patient, who stated his speech has significantly improved from yesterday to today.  He stated that yesterday, he had slurred speech and word finding difficulty.  Today, he has "gotten stuck" approximately 2-3 times.  Pt's explanation sound like semantic paraphasias.  None were noted in conversation.  Education provided to pt re: role of SLP in acute care setting vs outpatient and encouraged pt to seek OP speech therapy if speech does not fully return to baseline.  He verbalized understanding and agreement.     Will continue to monitor for appropriateness.       03/11/2023   Tor Netters, M.S., SLP  [employer] [phone]  Department #: [phone]

## 2023-03-12 DIAGNOSIS — F319 Bipolar disorder, unspecified: Secondary | ICD-10-CM

## 2023-03-12 LAB — CBC
Absolute NRBC: 0 10*3/uL (ref 0.00–0.00)
Hematocrit: 41.6 % (ref 37.6–49.6)
Hgb: 14.5 g/dL (ref 12.5–17.1)
MCH: 27.7 pg (ref 25.1–33.5)
MCHC: 34.9 g/dL (ref 31.5–35.8)
MCV: 79.4 fL (ref 78.0–96.0)
MPV: 9.4 fL (ref 8.9–12.5)
Nucleated RBC: 0 /100 WBC (ref 0.0–0.0)
Platelets: 337 10*3/uL (ref 142–346)
RBC: 5.24 10*6/uL (ref 4.20–5.90)
RDW: 12 % (ref 11–15)
WBC: 9.07 10*3/uL (ref 3.10–9.50)

## 2023-03-12 LAB — BASIC METABOLIC PANEL
Anion Gap: 9 (ref 5.0–15.0)
BUN: 16 mg/dL (ref 9.0–28.0)
CO2: 22 mEq/L (ref 17–29)
Calcium: 9.1 mg/dL (ref 8.5–10.5)
Chloride: 110 mEq/L (ref 99–111)
Creatinine: 0.8 mg/dL (ref 0.5–1.5)
Glucose: 108 mg/dL — ABNORMAL HIGH (ref 70–100)
Potassium: 4.1 mEq/L (ref 3.5–5.3)
Sodium: 141 mEq/L (ref 135–145)
eGFR: >60 mL/min/{1.73_m2} (ref >=60)

## 2023-03-12 LAB — RENAL FUNCTION PANEL
Albumin: 1.6 g/dL — ABNORMAL LOW (ref 3.5–5.0)
Anion Gap: 4 — ABNORMAL LOW (ref 5.0–15.0)
BUN: 9 mg/dL (ref 9.0–28.0)
CO2: 10 mEq/L — ABNORMAL LOW (ref 17–29)
Calcium: 4 mg/dL — CL (ref 8.5–10.5)
Chloride: 133 mEq/L — ABNORMAL HIGH (ref 99–111)
Creatinine: 0.4 mg/dL — ABNORMAL LOW (ref 0.5–1.5)
Glucose: 54 mg/dL — ABNORMAL LOW (ref 70–100)
Phosphorus: 2 mg/dL — ABNORMAL LOW (ref 2.3–4.7)
Potassium: 1.9 mEq/L — CL (ref 3.5–5.3)
Sodium: 147 mEq/L — ABNORMAL HIGH (ref 135–145)
eGFR: >60 mL/min/{1.73_m2} (ref >=60)

## 2023-03-12 LAB — MAGNESIUM: Magnesium: 1.8 mg/dL (ref 1.6–2.6)

## 2023-03-12 NOTE — Plan of Care (Signed)
NURSING SHIFT NOTE     Patient: Kurt Rogers  Day: 0      SHIFT EVENTS   Patient awake alert and oriented. Room air , denies pain and SOB   Attached to the tele normal sinus rhythm     Plan of care on going ,Will continue to monitor vital signs and lab results     Safety and fall precautions remain in place. Purposeful rounding completed.          ASSESSMENT     Changes in assessment from patient's baseline this shift:    Neuro: No  CV: No  Pulm: No  Peripheral Vascular: No  HEENT: No  GI: No  BM during shift: No, Last BM: Last BM Date: 03/12/23 (per patient)  GU: No   Integ: No  MS: No    Pain: None    Mobility: PMP Activity: Step 6 - Walks in Room of Distance Walked (ft) (Step 6,7): 5 Feet           Lines     Patient Lines/Drains/Airways Status       Active Lines, Drains and Airways       Name Placement date Placement time Site Days    Peripheral IV 03/10/23 20 G Left Antecubital 03/10/23  1337  Antecubital  2    Peripheral IV 03/10/23 18 G Diffusion Anterior;Distal;Right Antecubital 03/10/23  1355  Antecubital  2                         VITAL SIGNS     Vitals:    03/12/23 1912   BP: 124/81   Pulse: 75   Resp: 16   Temp: 98.6 F (37 C)   SpO2: 97%       Temp  Min: 97.8 F (36.6 C)  Max: 98.6 F (37 C)  Pulse  Min: 56  Max: 104  Resp  Min: 14  Max: 41  BP  Min: 113/61  Max: 134/85  SpO2  Min: 93 %  Max: 97 %    No intake or output data in the 24 hours ending 03/12/23 2014           CARE PLAN        Problem: Moderate/High Fall Risk Score >5  Goal: Patient will remain free of falls  Outcome: Progressing  Flowsheets  Taken 03/12/2023 0800 by Vassie Moselle, RN  High (Greater than 13):   LOW-Fall Interventions Appropriate for Low Fall Risk   LOW-Anticoagulation education for injury risk   MOD-Use of chair-pad alarm when appropriate   MOD-Remain with patient during toileting   MOD-Use of assistive devices -Bedside Commode if appropriate   MOD-Perform dangle, stand, walk (DSW) prior to mobilization    HIGH-Visual cue at entrance to patient's room   HIGH-Bed alarm on at all times while patient in bed   HIGH-Initiate use of floor mats as appropriate   HIGH-Consider use of low bed  Taken 03/11/2023 2003 by Paul Half, RN  Moderate Risk (6-13):   MOD-Consider activation of bed alarm if appropriate   MOD-Apply bed exit alarm if patient is confused   MOD-Floor mat at bedside (where available) if appropriate   MOD-Consider a move closer to Nurses Station   MOD-Remain with patient during toileting   MOD-Place bedside commode and assistive devices out of sight when not in use   MOD-Re-orient confused patients   MOD-Utilize diversion activities   MOD-Perform dangle, stand, walk (  DSW) prior to mobilization   MOD-Request PT/OT consult order for patients with gait/mobility impairment   MOD-Use gait belt when appropriate   MOD-include family in multidisciplinary POC discussions   MOD- Consider video monitoring     Problem: Compromised Sensory Perception  Goal: Sensory Perception Interventions  Outcome: Progressing  Flowsheets (Taken 03/12/2023 2000)  Sensory Perception Interventions: Offload heels, Pad bony prominences, Reposition q 2hrs/turn Clock, Q2 hour skin assessment under devices if present     Problem: Compromised Activity/Mobility  Goal: Activity/Mobility Interventions  Outcome: Progressing  Flowsheets (Taken 03/12/2023 2000)  Activity/Mobility Interventions: Pad bony prominences, TAP Seated positioning system when OOB, Promote PMP, Reposition q 2 hrs / turn clock, Offload heels     Problem: Compromised Nutrition  Goal: Nutrition Interventions  Outcome: Progressing  Flowsheets (Taken 03/11/2023 0400 by Royetta Asal, RN)  Nutrition Interventions: Discuss nutrition at Rounds, I&Os, Document % meal eaten, Daily weights     Problem: Every Day - Stroke  Goal: Core/Quality measure requirements - Daily  Outcome: Progressing  Flowsheets (Taken 03/10/2023 2005 by Julien Girt, RN)  Core/Quality measure requirements - Daily:    VTE Prevention: Ensure anticoagulant(s) administered and/or anti-embolism stockings/devices documented by end of day 2   Ensure antithrombotic administered or contraindication documented by LIP by end of day 2   Once lipid panel has resulted, check LDL. Contact provider for statin order if LDL > 70 (or ensure contraindication documented by LIP).   Continue stroke education (must include Modifiable Risk Factors, Warning Signs and Symptoms of Stroke, Activation of Emergency Medical System and Follow-up Appointments). Ensure handout has been given and documented.  Goal: Neurological status is stable or improving  Outcome: Progressing  Flowsheets (Taken 03/11/2023 2003 by Paul Half, RN)  Neurological status is stable or improving:   Monitor/assess/document neurological assessment (Stroke: every 4 hours)   Monitor/assess NIH Stroke Scale   Re-assess NIH Stroke Scale for any change in status   Observe for seizure activity and initiate seizure precautions if indicated   Perform CAM Assessment  Goal: Stable vital signs and fluid balance  Outcome: Progressing  Flowsheets (Taken 03/11/2023 1638 by Lyda Jester, RN)  Stable vital signs and fluid balance:   Position patient for maximum circulation/cardiac output   Monitor and assess vitals every 4 hours or as ordered and hemodynamic parameters   Monitor intake and output. Notify LIP if urine output is < 30 mL/hour.   Encourage oral fluid intake   Apply telemetry monitor as ordered  Goal: Patient's risk of aspiration will be minimized  Outcome: Progressing  Flowsheets (Taken 03/10/2023 2005 by Julien Girt, RN)  Patient's risk of aspiration will be minimized:   Complete new dysphagia screen for any change in status: Keep patient NPO if patient fails   Monitor/assess for signs of aspiration (tachypnea, cough, wheezing, clearing throat, hoarseness after eating, decrease in SaO2   Order modified texture diet as recommend by Speech Pathologist   Assess and monitor  ability to swallow

## 2023-03-12 NOTE — Progress Notes (Cosign Needed)
FOUR EYES SKIN ASSESSMENT NOTE    Kurt Rogers  [birth year]  [id number]  Braden Scale Score: 20            ICU ONLY: Was HAPI Intervention list reviewed with incoming RN/PCT? Yes    Bony Prominences: Check appropriate box; if wound is present enter wound assessment in LDA     Occiput:                 [x] WNL  []  Wound present  Face:                     [x] WNL  []  Wound present  Ears:                      [x] WNL  []  Wound present  Spine:                    [x] WNL  []  Wound present  Shoulders:             [x] WNL  []  Wound present  Elbows:                  [x] WNL  []  Wound present  Sacrum/coccyx:     [x] WNL  []  Wound present  Ischial Tuberosity:  [x] WNL  []  Wound present  Trochanter/Hip:      [x] WNL  []  Wound present  Knees:                   [x] WNL  []  Wound present  Ankles:                   [x] WNL  []  Wound present  Heels:                    [x] WNL  []  Wound present  Other pressure areas:  [x]  Wound location - surgical incision LL leg        Device related: []  Device name:           Other skin related issues, ie tears, rash, etc, document in Integumentary flowsheet    If Wound/Pressure Injury present:  Wound/PI assessment documented in LDA (will be shown below): Yes  Admitting physician notified: Yes  Wound consult ordered: Yes    Paul Half, RN  March 12, 2023  12:28 AM    Second RN/PCT Name:    Wound 02/17/23 Surgical Incision Leg dermabond (Active)

## 2023-03-12 NOTE — Progress Notes (Signed)
NURSING SHIFT NOTE     Patient: Kurt Rogers  Day: 0      SHIFT EVENTS     Shift Narrative/Significant Events (PRN med administration, fall, RRT, etc.):     Pt admitted to the unit around 1800, VSS, no complains of pain. NIH completed at bedside with Gerlene Burdock, RN. 4 eyes skin assessment completed. Cont' stroke protocol. CM working on [employer]. Safety and fall precautions remain in place. Purposeful rounding completed.          ASSESSMENT     Changes in assessment from patient's baseline this shift:    Neuro: No  CV: No  Pulm: No  Peripheral Vascular: No  HEENT: No  GI: No  BM during shift: Yes 1 , Last BM: Last BM Date: 03/12/23 (per patient)  GU: No   Integ: No  MS: No    Pain: None  Pain Interventions: Rest  Medications Utilized: none    Mobility: PMP Activity: Step 6 - Walks in Room of Distance Walked (ft) (Step 6,7): 5 Feet           Lines     Patient Lines/Drains/Airways Status       Active Lines, Drains and Airways       Name Placement date Placement time Site Days    Peripheral IV 03/10/23 20 G Left Antecubital 03/10/23  1337  Antecubital  2    Peripheral IV 03/10/23 18 G Diffusion Anterior;Distal;Right Antecubital 03/10/23  1355  Antecubital  2                         VITAL SIGNS     Vitals:    03/12/23 1802   BP: 125/78   Pulse: 79   Resp:    Temp: 98.1 F (36.7 C)   SpO2: 97%       Temp  Min: 97.8 F (36.6 C)  Max: 98.5 F (36.9 C)  Pulse  Min: 56  Max: 104  Resp  Min: 14  Max: 41  BP  Min: 113/61  Max: 134/85  SpO2  Min: 93 %  Max: 97 %      Intake/Output Summary (Last 24 hours) at 03/12/2023 1804  Last data filed at 03/11/2023 2000  Gross per 24 hour   Intake 480 ml   Output 100 ml   Net 380 ml          4 eyes in 4 hours pressure injury assessment note:      Completed with: Selena Batten, tech  Unit & Time admitted: Unit 25, 1800 04/24             Bony Prominences: Check appropriate box; if wound is present enter wound assessment in LDA     Occiput:                 [x] WNL  []  Wound present  Face:                      [x] WNL  []  Wound present  Ears:                      [x] WNL  []  Wound present  Spine:                    [x] WNL  []  Wound present  Shoulders:             [x] WNL  []  Wound present  Elbows:                  [  x]WNL  []  Wound present  Sacrum/coccyx:     [x] WNL  []  Wound present  Ischial Tuberosity:  [x] WNL  []  Wound present  Trochanter/Hip:      [x] WNL  []  Wound present  Knees:                   [x] WNL  []  Wound present  Ankles:                   [x] WNL  []  Wound present  Heels:                    [x] WNL  []  Wound present  Other pressure areas: Scar on L-leg from previous surgery       Device related: []  Device name: none        LDA completed if wound present: yes/no  Consult WOCN if necessary    Other skin related issues, ie tears, rash, etc, document in Integumentary flowsheet

## 2023-03-12 NOTE — Progress Notes (Addendum)
LOS # 0      Summary of Discharge Plan: [facility].      Identified Possible Discharge Barriers: Pending authorization.      CM Interventions and Outcome: CM met with the patient at bedside. Patient agrees to Acute Rehab placement. He chose [facility]. Liaison notified. Insurance authorization requested to be initiated today pending acceptance.     ADDENDUM: Patient accepted to [facility]. Auth to be initiated once psychiatry note is in per the auth team.       Discussed above Discharge Plan with (patient, family, Care Team, others): care team, patient      Case Management will continue to following on patient's discharge needs.      Marlan Palau, MSW, ACM-SW  Social Worker Case Manager II  [facility]

## 2023-03-12 NOTE — Consults (Signed)
Psychiatric Eval    Reason for consult/CC: r/o conversion    HPI:  32 yo M with adhd, anxiety, bipolar, recent leg surgery p/w LUE and LLE and l sided facial weakness.  Her received tpa.    Per H+P: Per chart review, patient has had prior similar episodes of acute left-sided weakness, most recently July 2023 and prior episodes dating back to February 2022.  He has reportedly undergone extensive neurologic workup by neurologist Dr. Lanette Hampshire at [facility], including MRI brain February 2022 showing left frontal signal abnormalities, as well as LP with CSF studies reportedly negative for MS.  -Outside neurologist raised questions of MS versus conversion disorder        His adderall, lithium and buspar has been resumed.  Glori Bickers is coming back in upper ext and he feels confident it will in his LE also.     He says emotionally he is doing pretty well.  Lithium has been a huge help for him.  Has had minor mood epsiodes in the last year but nothing major since starting lithium and there have been no major stressors in his life recently.    He is in good spirits and feels hopeful that he will gain his stregnth back.  Sleep is fine.  Eating well.  No si or psychotic si.sx    Strong family history of MS noted    ROS: complete ROS done and negative unless otherwise noted above    Past Psych History:  Current psychiatrist/therapist? yes    H/o:  Suicidal ideation? yes  Suicide attempts?   no  Inpatient stays? 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, has a Printmaker job, [place]  Social History     Social History Narrative    Not on file     Substance Use: very rare etoh use    Allergies   Allergen Reactions    Shellfish-Derived Products Anaphylaxis    Latex Rash     And swelling         Current Facility-Administered Medications   Medication  Dose Route Frequency    amphetamine-dextroamphetamine  10 mg Oral BID    busPIRone  5 mg Oral BID    heparin (porcine)  5,000 Units Subcutaneous Q8H SCH    lithium  300 mg Oral TID    senna-docusate  1 tablet Oral QHS     Current Facility-Administered Medications   Medication Dose Route Frequency Last Rate    niCARdipine  0-15 mg/hr Intravenous Continuous PRN       Current Facility-Administered Medications   Medication Dose Route    calcium GLUConate  1 g Intravenous    diphenhydrAMINE  25 mg Oral    hydrALAZINE  10 mg Intravenous    labetalol  10 mg Intravenous    magnesium oxide  400-800 mg Oral    Or    magnesium sulfate  1 g Intravenous    niCARdipine  0-15 mg/hr Intravenous    potassium chloride  0-60 mEq Oral    Or    potassium chloride  0-60 mEq Oral    Or    potassium chloride  10 mEq Intravenous    QUEtiapine  25 mg Oral    sodium  phosphates 15 mmol in dextrose 5 % 250 mL IVPB  15 mmol Intravenous    sodium phosphates 25 mmol in dextrose 5 % 250 mL IVPB  25 mmol Intravenous    sodium phosphates 35 mmol in dextrose 5 % 250 mL IVPB  35 mmol Intravenous           Mental Status Exam:    General appearance: Appears chronological age, fair hygiene and grooming is fair, no body odor  Attitude/Behavior: Calm, Cooperative and Eye Contact is  Good  Motor: No abnormalities noted  Gait: No obvious abnormalities  Muscle strength and tone: Grossly intact  Speech:   Spontaneous: Yes  Rate, volume and tone are normal  No increased latency  Mood: "fine"  Affect:  Mood congruent w/ range of affect  Thought Process:   Coherent: Yes  Logical:  Yes  Associations: Goal-directed  Thought Content:  No SI/HI  Perceptions:  No AH/VH  Does not seem to be responding to internal stimuli  Insight: good  Judgment: good  Cognition:   Level of Consciousness: Intact  Orientation: Intact to self, place and time  Recent Memory: Intact  Remote Memory: Intact  Attention and Concentration: Intact  Language: Repetition: Intact  Recognition:  Intact  Fund of Knowledge: Good    Suicide risk assessment:  Acute risk: low  Chronic risk:low  Protective fx: no ideation,  no depressed mood, no etoh, no guns  Risk fx: age, race, gender, chronic mental health issues    [place]:  Wished you were dead or would not wake up in the last month?  Thoughts of killing yourself in the last month?  Thoughts about how you might do this?  Any intention of acting on these thoughts? Yes--> high risk  Have you started to work out details?  Do you intend to cary out the plan?  Yes--> high risk  Have you done anything, started to do anything, or prepared to do anything to end your life?  Ever?  In the past 3 months?  Yes-->high risk        Results       Procedure Component Value Units Date/Time    Basic Metabolic Panel [[id number]]  (Abnormal) Collected: 03/12/23 0446    Specimen: Blood Updated: 03/12/23 0524     Glucose 108 mg/dL      BUN 40.9 mg/dL      Creatinine 0.8 mg/dL      Calcium 9.1 mg/dL      Sodium 811 mEq/L      Potassium 4.1 mEq/L      Chloride 110 mEq/L      CO2 22 mEq/L      Anion Gap 9.0     eGFR >60.0 mL/min/1.73 m2     Magnesium [[id number]] Collected: 03/12/23 0359    Specimen: Blood Updated: 03/12/23 0427     Magnesium 1.8 mg/dL     Renal function panel [[id number]]  (Abnormal) Collected: 03/12/23 0359    Specimen: Blood Updated: 03/12/23 0427     Glucose 54 mg/dL      Sodium 469 mEq/L      Potassium 1.9 mEq/L      Chloride 133 mEq/L      CO2 10 mEq/L      BUN 9.0 mg/dL      Calcium 4.0 mg/dL      Creatinine 0.4 mg/dL      Albumin 1.6 g/dL      Phosphorus 2.0 mg/dL      Anion Gap  4.0     eGFR >60.0 mL/min/1.73 m2     CBC without differential [[id number]] Collected: 03/12/23 0359    Specimen: Blood Updated: 03/12/23 0410     WBC 9.07 x10 3/uL      Hgb 14.5 g/dL      Hematocrit 21.3 %      Platelets 337 x10 3/uL      RBC 5.24 x10 6/uL      MCV 79.4 fL      MCH 27.7 pg      MCHC 34.9 g/dL      RDW 12 %      MPV 9.4 fL      Nucleated RBC 0.0 /100 WBC      Absolute  NRBC 0.00 x10 3/uL     Lithium level [[id number]]  (Abnormal) Collected: 03/11/23 1150    Specimen: Blood Updated: 03/11/23 1213     Lithium Level 0.360 mEq/L     TSH, Abn Reflex to Free T4, Serum [[id number]] Collected: 03/11/23 0310     Updated: 03/11/23 1002     TSH, Abn Reflex to Free T4, Serum 1.87 uIU/mL     Lipid panel [[id number]]  (Abnormal) Collected: 03/11/23 0310    Specimen: Blood Updated: 03/11/23 0954     Cholesterol 151 mg/dL      Triglycerides 536 mg/dL      HDL 28 mg/dL      LDL Calculated 96 mg/dL      VLDL Calculated 27 mg/dL      Cholesterol / HDL Ratio 5.4 Index     Hemoglobin A1C [[id number]] Collected: 03/11/23 0315    Specimen: Blood Updated: 03/11/23 0940     Hemoglobin A1C 5.4 %      Average Estimated Glucose 108.3 mg/dL     [id number] (SARS-CoV-2) and Influenza A/B, NAA ([facility]iat Rapid)- Admission [[id number]] Collected: 03/11/23 0729    Specimen: Culturette from Nasopharyngeal Updated: 03/11/23 0814     Purpose of COVID testing Diagnostic -PUI     SARS-CoV-2 Specimen Source Nasal Swab     SARS CoV 2 Overall Result Not Detected     Influenza A Not Detected     Influenza B Not Detected    Narrative:      o Collect and clearly label specimen type:  o PREFERRED-Upper respiratory specimen: One Nasal Swab in  Transport Media.  o Hand deliver to laboratory ASAP  Diagnostic -PUI    Urinalysis Reflex to Microscopic Exam- Reflex to Culture [[id number]]  (Abnormal) Collected: 03/11/23 0630    Specimen: Urine, Clean Catch Updated: 03/11/23 0706     Urine Type Urine, Clean Ca     Color, UA Yellow     Clarity, UA Clear     Specific Gravity UA >1.050     Urine pH 6.0     Leukocyte Esterase, UA Negative     Nitrite, UA Negative     Protein, UR 30= 1+     Glucose, UA Negative     Ketones UA Negative     Urobilinogen, UA Normal mg/dL      Bilirubin, UA Negative     Blood, UA Negative     RBC, UA 0-2 /hpf      WBC, UA 0-5 /hpf      Squamous Epithelial Cells, Urine 0-5 /hpf      Urine Mucus Present    Narrative:       If not done in the ED.    Rapid drug screen, urine [[id number]]  (Abnormal)  Collected: 03/11/23 0630    Specimen: Urine Updated: 03/11/23 0706     Urine Amphetamine Screen Positive     Barbiturate Screen, UR Negative     Benzodiazepine Screen, UR Negative     Cannabinoid Screen, UR Negative     Cocaine, UR Negative     Urine Fentanyl Negative     Opiate Screen, UR Negative     PCP Screen, UR Negative    Narrative:      If not done in the ED.    High Sensitivity Troponin-I at 0 hrs [[id number]] Collected: 03/11/23 0310    Specimen: Blood Updated: 03/11/23 0348     hs Troponin-I <2.7 ng/L     Renal function panel [[id number]]  (Abnormal) Collected: 03/11/23 0310    Specimen: Blood Updated: 03/11/23 0344     Glucose 102 mg/dL      Sodium 782 mEq/L      Potassium 4.2 mEq/L      Chloride 106 mEq/L      CO2 24 mEq/L      BUN 14.0 mg/dL      Calcium 9.6 mg/dL      Creatinine 1.1 mg/dL      Albumin 3.9 g/dL      Phosphorus 5.5 mg/dL      Anion Gap 95.6     eGFR >60.0 mL/min/1.73 m2     Magnesium [[id number]] Collected: 03/11/23 0310     Updated: 03/11/23 0344     Magnesium 2.3 mg/dL     APTT [[id number]] Collected: 03/11/23 0310     Updated: 03/11/23 0332     PTT 34 sec     Prothrombin time/INR [[id number]] Collected: 03/11/23 0310    Specimen: Blood Updated: 03/11/23 0332     PT 12.6 sec      PT INR 1.1    CBC without differential [[id number]]  (Abnormal) Collected: 03/11/23 0315    Specimen: Blood Updated: 03/11/23 0324     WBC 8.91 x10 3/uL      Hgb 15.0 g/dL      Hematocrit 03.4 %      Platelets 352 x10 3/uL      RBC 5.52 x10 6/uL      MCV 79.2 fL      MCH 27.2 pg      MCHC 34.3 g/dL      RDW 12 %      MPV 9.1 fL      Nucleated RBC 0.0 /100 WBC      Absolute NRBC 0.00 x10 3/uL     Magnesium [[id number]] Collected: 03/11/23 0310    Specimen: Blood Updated: 03/11/23 0315    C Reactive Protein [[id number]] Collected: 03/10/23 1429    Specimen: Blood Updated: 03/10/23 2003     C-Reactive Protein 0.1 mg/dL     Type and Screen  [[id number]] Collected: 03/10/23 1732    Specimen: Blood Updated: 03/10/23 1828     ABO Rh B POS     AB Screen Gel NEG    Narrative:      Pre-Surgical/Pre-Procedure->No  For transfusion?->No    Blood Type Confirmation [[id number]] Collected: 03/10/23 1732     Updated: 03/10/23 1828     Blood Type Confirmation B POS    Narrative:      If not done in the ED.    High Sensitivity Troponin-I at 2 hrs with calculated Delta [[id number]] Collected: 03/10/23 1732    Specimen: Blood Updated: 03/10/23 1817     hs Troponin-I <2.7 ng/L  hs Troponin-I Delta Unable toCalc. ng/L     Comprehensive metabolic panel [[id number]]  (Abnormal) Collected: 03/10/23 1732    Specimen: Blood Updated: 03/10/23 1810     Glucose 90 mg/dL      BUN 21.3 mg/dL      Creatinine 0.9 mg/dL      Sodium 086 mEq/L      Potassium 3.9 mEq/L      Chloride 106 mEq/L      CO2 23 mEq/L      Calcium 9.6 mg/dL      Protein, Total 6.8 g/dL      Albumin 4.2 g/dL      AST (SGOT) 28 U/L      ALT 65 U/L      Alkaline Phosphatase 72 U/L      Bilirubin, Total 0.6 mg/dL      Globulin 2.6 g/dL      Albumin/Globulin Ratio 1.6     Anion Gap 10.0     eGFR >60.0 mL/min/1.73 m2     Narrative:      If not done in the ED.    CBC and differential [[id number]]  (Abnormal) Collected: 03/10/23 1732    Specimen: Blood Updated: 03/10/23 1752     WBC 12.20 x10 3/uL      Hgb 15.6 g/dL      Hematocrit 52.8 %      Platelets 411 x10 3/uL      RBC 5.74 x10 6/uL      MCV 78.0 fL      MCH 27.2 pg      MCHC 34.8 g/dL      RDW 12 %      MPV 9.3 fL      Instrument Absolute Neutrophil Count 8.23 x10 3/uL      Neutrophils 67.4 %      Lymphocytes Automated 23.9 %      Monocytes 6.6 %      Eosinophils Automated 1.2 %      Basophils Automated 0.5 %      Immature Granulocytes 0.4 %      Nucleated RBC 0.0 /100 WBC      Neutrophils Absolute 8.23 x10 3/uL      Lymphocytes Absolute Automated 2.91 x10 3/uL      Monocytes Absolute Automated 0.80 x10 3/uL      Eosinophils Absolute Automated 0.15 x10 3/uL       Basophils Absolute Automated 0.06 x10 3/uL      Immature Granulocytes Absolute 0.05 x10 3/uL      Absolute NRBC 0.00 x10 3/uL     Narrative:      If not done in the ED.    Basic Metabolic Panel [[id number]] Collected: 03/10/23 1732    Specimen: Blood Updated: 03/10/23 1735    Sedimentation rate (ESR) [[id number]] Collected: 03/10/23 1429    Specimen: Blood Updated: 03/10/23 1711     Sed Rate 7 mm/Hr     Rapid drug screen, urine [[id number]] Collected: 03/10/23 1429    Specimen: Urine Updated: 03/10/23 1541     Urine Amphetamine Screen Negative     Barbiturate Screen, UR Negative     Benzodiazepine Screen, UR Negative     Cannabinoid Screen, UR Negative     Cocaine, UR Negative     Urine Fentanyl Negative     Opiate Screen, UR Negative     PCP Screen, UR Negative    High Sensitivity Troponin-I [[id number]] Collected: 03/10/23 1429    Specimen: Blood Updated: 03/10/23  1508     hs Troponin-I <2.7 ng/L     Comprehensive metabolic panel [[id number]]  (Abnormal) Collected: 03/10/23 1429    Specimen: Blood Updated: 03/10/23 1502     Glucose 88 mg/dL      BUN 78.2 mg/dL      Creatinine 1.0 mg/dL      Sodium 956 mEq/L      Potassium 4.1 mEq/L      Chloride 104 mEq/L      CO2 26 mEq/L      Calcium 10.1 mg/dL      Protein, Total 7.7 g/dL      Albumin 4.8 g/dL      AST (SGOT) 33 U/L      ALT 78 U/L      Alkaline Phosphatase 82 U/L      Bilirubin, Total 0.6 mg/dL      Globulin 2.9 g/dL      Albumin/Globulin Ratio 1.7     Anion Gap 11.0     eGFR >60.0 mL/min/1.73 m2     APTT [[id number]] Collected: 03/10/23 1429     Updated: 03/10/23 1455     PTT 34 sec     Prothrombin time/INR [[id number]] Collected: 03/10/23 1429    Specimen: Blood Updated: 03/10/23 1455     PT 12.1 sec      PT INR 1.0    CBC and differential [[id number]]  (Abnormal) Collected: 03/10/23 1429    Specimen: Blood Updated: 03/10/23 1446     WBC 10.91 x10 3/uL      Hgb 17.1 g/dL      Hematocrit 27.2 %      Platelets 469 x10 3/uL      RBC 6.18 x10 6/uL      MCV 79.1 fL       MCH 27.7 pg      MCHC 35.0 g/dL      RDW 12 %      MPV 9.6 fL      Instrument Absolute Neutrophil Count 6.67 x10 3/uL      Neutrophils 61.1 %      Lymphocytes Automated 30.5 %      Monocytes 5.7 %      Eosinophils Automated 1.6 %      Basophils Automated 0.8 %      Immature Granulocytes 0.3 %      Nucleated RBC 0.0 /100 WBC      Neutrophils Absolute 6.67 x10 3/uL      Lymphocytes Absolute Automated 3.33 x10 3/uL      Monocytes Absolute Automated 0.62 x10 3/uL      Eosinophils Absolute Automated 0.17 x10 3/uL      Basophils Absolute Automated 0.09 x10 3/uL      Immature Granulocytes Absolute 0.03 x10 3/uL      Absolute NRBC 0.00 x10 3/uL     Glucose Whole Blood - POCT [[id number]] Collected: 03/10/23 1336     Updated: 03/10/23 1338     Whole Blood Glucose POCT 93 mg/dL           EKG Results       ** No results found for the last 48 hours. **          @IMAGES @      Assessment:  32 yo WM with bipolar do here with unexplained L sided neuro issues.    Lithium level low, 0.36 but may have missed at least one dose prior to this being drawn.      Uds pos for  amphetamines but on stimulants.    MRI does not explain symptoms and showed: IMPRESSION:      1.Small foci of T2 prolongation in the anterior left subinsular region also  with minor gliosis related to a prior ischemic, infectious inflammatory  process.  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      No recent stressors to suggest that this a conversion disorder and his insight into the possibility is good and his thoughts surrounding it sensible.  He does have a strong FH of MS of unclear significance.    Psych Dx.  Bipolar disorder  Unlikely that this is a conversion do    Plan: continue current meds  Cleared from psych perspective          Dixon Boos, MD  Psychiatry/Internal Medicine    Time spent: 75 min including >50% of time spent coordinating care and/or counseling the patient

## 2023-03-12 NOTE — Progress Notes (Signed)
[facility] Note:    Insurance authorization for admission to acute rehab at [facility] initiated with [employer].   Clinical information submitted for insurance review.  Will notify liaison once determination is received.     Kurt Rogers R Yuepheng Schaller, BSN, RN, CRRN, ACM  Rehab Admissions Liaison/Insurance Authorizations  [facility]  [facility]

## 2023-03-12 NOTE — Progress Notes (Signed)
Brief psych note  Full consult to follow   Dx bipolar.   Psychiatrically stable. Continue home meds. Cleared for [place]. Already has follow up. No acute concerns.

## 2023-03-12 NOTE — Progress Notes (Signed)
[facility]  PROGRESS NOTE      Patient: Kurt Rogers  Date: 03/12/2023   LOS: 0 Days  Admission Date: 03/10/2023   MRN: [id number]  Attending: Buelah Manis, MD  Please page me using Epic SecureChat       ASSESSMENT/PLAN     Kurt Rogers is a 32 y.o. male admitted with Left-sided weakness      Left-sided weakness/tremor status post thrombolysis  MRI negative, stroke rule out, possible suspected conversion disorder  MRI unremarkable as well as MRA of head and neck unremarkable  Continue neurochecks every 4 hours  Lipid panel, hemoglobin A1c TSH unremarkable  Psych consulted for further evaluation - conversion disorder  PT OT recommended acute rehab    Anxiety and depression  Continue home psych medications, BuSpar, Seroquel    Mood disorder unspecified possibly bipolar  Continue home medications lithium    History of ADHD  Continue home medication, Adderall    Nutrition:    Regular    DVT Prophylaxis: Heparin    Code Status: Full Code    Chart Review and Discussion:   The following chart items were reviewed as of 5:59 PM on 03/12/23:  [x]  Lab Results [x]  Imaging Results   []  Problem List  [x]  Current Orders [x]  Current Medications  []  Allergies  []  Code Status []  Advanced Care Planning  []  SDoH        The management and plan of care for this patient was discussed with the following specialty consultants:  []  Cardiology  [] Gastroenterology                 []  Infectious Disease  []  Pulmonology [x]  Neurology                []  Nephrology  []  Neurosurgery []  Orthopedic Surgery  []  Heme/Onc  []  General Surgery [x]  Psychiatry                                   []  Palliative       SUBJECTIVE     No acute events overnight.     MEDICATIONS     Current Facility-Administered Medications   Medication Dose Route Frequency    amphetamine-dextroamphetamine  10 mg Oral BID    busPIRone  5 mg Oral BID    heparin (porcine)  5,000 Units Subcutaneous Q8H SCH    lithium  300 mg Oral TID    senna-docusate  1  tablet Oral QHS       PHYSICAL EXAM     Vitals:    03/12/23 1600   BP:    Pulse: (!) 104   Resp: (!) 34   Temp: 98.1 F (36.7 C)   SpO2: 93%       Temperature: Temp  Min: 97.8 F (36.6 C)  Max: 98.5 F (36.9 C)  Pulse: Pulse  Min: 56  Max: 104  Respiratory: Resp  Min: 14  Max: 41  Non-Invasive BP: BP  Min: 113/61  Max: 134/85  Pulse Oximetry SpO2  Min: 93 %  Max: 97 %    Intake and Output Summary (Last 24 hours) at Date Time    Intake/Output Summary (Last 24 hours) at 03/12/2023 1759  Last data filed at 03/11/2023 2000  Gross per 24 hour   Intake 480 ml   Output 100 ml   Net 380 ml       GEN  APPEARANCE: Patient resting in bed in NAD  HEENT: No scleral icterus, MMM  CVS: RRR, S1, S2; No M/G/R  LUNGS: CTAB; No Wheezes; No Rhonchi: No rales  ABD: Soft; No TTP; + Normoactive BS, no rebound or guarding  EXT: WWP,   Skin exam:  No gross lesions noted on exposed skin surfaces  MENTAL STATUS:  Answers questions appropriately, responds to commands        LABS     Recent Labs   Lab 03/12/23  0359 03/11/23  0315 03/10/23  1732   WBC 9.07 8.91 12.20*   RBC 5.24 5.52 5.74   Hgb 14.5 15.0 15.6   Hematocrit 41.6 43.7 44.8   MCV 79.4 79.2 78.0   Platelets 337 352* 411*       Recent Labs   Lab 03/12/23  0446 03/12/23  0359 03/11/23  0310 03/10/23  1732 03/10/23  1429   Sodium 141 147* 141 139 141   Potassium 4.1 1.9* 4.2 3.9 4.1   Chloride 110 133* 106 106 104   CO2 22 10* 24 23 26    BUN 16.0 9.0 14.0 12.0 15.0   Creatinine 0.8 0.4* 1.1 0.9 1.0   Glucose 108* 54* 102* 90 88   Calcium 9.1 4.0* 9.6 9.6 10.1   Magnesium  --  1.8 2.3  --   --        Recent Labs   Lab 03/12/23  0359 03/11/23  0310 03/10/23  1732 03/10/23  1429   ALT  --   --  65* 78*   AST (SGOT)  --   --  28 33   Bilirubin, Total  --   --  0.6 0.6   Albumin 1.6* 3.9 4.2 4.8   Alkaline Phosphatase  --   --  72 82       Recent Labs   Lab 03/11/23  0310 03/10/23  1732   hs Troponin-I <2.7 <2.7   hs Troponin-I Delta  --  Unable toCalc.       Recent Labs   Lab  03/11/23  0310 03/10/23  1429   PT INR 1.1 1.0   PT 12.6 12.1   PTT 34 34       Microbiology Results (last 15 days)       Procedure Component Value Units Date/Time    COVID-19 (SARS-CoV-2) and Influenza A/B, NAA (Liat Rapid)- Admission [[id number]] Collected: 03/11/23 0729    Order Status: Completed Specimen: Culturette from Nasopharyngeal Updated: 03/11/23 0814     Purpose of COVID testing Diagnostic -[facility]     SARS-CoV-2 Specimen Source Nasal Swab     SARS CoV 2 Overall Result Not Detected     Comment: __________________________________________________  -A result of "Detected" indicates POSITIVE for the    presence of SARS CoV-2 RNA  -A result of "Not Detected" indicates NEGATIVE for the    presence of SARS CoV-2 RNA  __________________________________________________________  Test performed using the Roche cobas Liat SARS-CoV-2 assay. This assay is  only for use under the Food and Drug Administration's Emergency Use  Authorization. This is a real-time RT-PCR assay for the qualitative  detection of SARS-CoV-2 RNA. Viral nucleic acids may persist in vivo,  independent of viability. Detection of viral nucleic acid does not imply the  presence of infectious virus, or that virus nucleic acid is the cause of  clinical symptoms. Negative results do not preclude SARS-CoV-2 infection and  should not be used as the sole basis for diagnosis, treatment or  other  patient management decisions. Negative results must be combined with  clinical observations, patient history, and/or epidemiological information.  Invalid results may be due to inhibiting substances in the specimen and  recollection should occur. Please see Fact Sheets for patients and providers  located:  WirelessDSLBlog.no          Influenza A Not Detected     Influenza B Not Detected     Comment: Test performed using the Roche cobas Liat SARS-CoV-2 & Influenza A/B assay.  This assay is only for use under the Food and Drug  Administration's  Emergency Use Authorization. This is a multiplex real-time RT-PCR assay  intended for the simultaneous in vitro qualitative detection and  differentiation of SARS-CoV-2, influenza A, and influenza B virus RNA. Viral  nucleic acids may persist in vivo, independent of viability. Detection of  viral nucleic acid does not imply the presence of infectious virus, or that  virus nucleic acid is the cause of clinical symptoms. Negative results do  not preclude SARS-CoV-2, influenza A, and/or influenza B infection and  should not be used as the sole basis for diagnosis, treatment or other  patient management decisions. Negative results must be combined with  clinical observations, patient history, and/or epidemiological information.  Invalid results may be due to inhibiting substances in the specimen and  recollection should occur. Please see Fact Sheets for patients and providers  located: http://www.rice.biz/.         Narrative:      o Collect and clearly label specimen type:  o PREFERRED-Upper respiratory specimen: One Nasal Swab in  Transport Media.  o Hand deliver to laboratory ASAP  Diagnostic -[facility]I             RADIOLOGY     CT Head WO Contrast    Result Date: 03/11/2023   Stable unremarkable appearance of the brain. No bleed or appreciable acute infarct. Trilby Drummer, MD 03/11/2023 2:13 PM    US Venous Low Extrem Duplx Dopp Comp Bilat    Result Date: 03/11/2023   No sonographic evidence for right or left lower extremity deep venous thrombosis. Marty Heck, MD 03/11/2023 9:20 AM    MR Angiogram Head WO Contrast    Result Date: 03/10/2023  1.There is no stenosis of the proximal right internal carotid artery based on NASCET criteria.  2.There is no stenosis of the proximal left internal carotid artery based on NASCET criteria. 3.The vertebrobasilar arterial system is patent. 4.Normal intracranial MR angiogram. Terrilee Croak, MD 03/10/2023 10:10 PM    MR Angiogram Neck W WO  Contrast    Result Date: 03/10/2023  1.There is no stenosis of the proximal right internal carotid artery based on NASCET criteria.  2.There is no stenosis of the proximal left internal carotid artery based on NASCET criteria. 3.The vertebrobasilar arterial system is patent. 4.Normal intracranial MR angiogram. Terrilee Croak, MD 03/10/2023 10:10 PM    MRI Brain W WO Contrast    Result Date: 03/10/2023   1.Small foci of T2 prolongation in the anterior left subinsular region also with minor gliosis related to a prior ischemic, infectious inflammatory process. 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 Terrilee Croak, MD 03/10/2023 10:05 PM    CT Head WO Contrast    Result Date: 03/10/2023   1.Normal examination. Terrilee Croak, MD 03/10/2023 6:58 PM    CT Angiogram Head Neck    Result Date: 03/10/2023  1.rCBF<30% images show 0 cc. Tmax>6sec images show 0  cc. 2.The intracranial arteries are patent, no proximal intracranial large vessel occlusion. 3.There is no stenosis of the proximal right internal carotid artery based on NASCET criteria. 4.There is no stenosis of the proximal left internal carotid artery based on NASCET criteria.  Alex Einar Pheasant, MD 03/10/2023 4:04 PM    CT Angiogram Cerebral Perfusion W 3D Reconstruction    Result Date: 03/10/2023  1.rCBF<30% images show 0 cc. Tmax>6sec images show 0 cc. 2.The intracranial arteries are patent, no proximal intracranial large vessel occlusion. 3.There is no stenosis of the proximal right internal carotid artery based on NASCET criteria. 4.There is no stenosis of the proximal left internal carotid artery based on NASCET criteria.  Alex Einar Pheasant, MD 03/10/2023 4:04 PM    CT Head without Contrast    Result Date: 03/10/2023   No acute intracranial abnormality. Georgann Housekeeper, MD 03/10/2023 2:00 PM       Signed,  Buelah Manis, MD  5:59 PM 03/12/2023

## 2023-03-12 NOTE — Preadmission Screening Note (Shared)
Physical Medicine and Rehabilitation  Clinical Liaison Preadmission Screening    Patient Name:  Kurt Rogers       Medical Record Number: [id number]     Probable Impairment Group Code: 0003.9 - Neurologic Conditions: Other Neurologic    Demographics  Date of Birth: 10/24/1991  Age: 32 y.o.  Sex: Male   Contact Person:   CONWAY, FEDORA [phone]   [phone] Father           Past Medical History:   Diagnosis Date    Abnormal vision     glasses    Anxiety     Attention deficit hyperactivity disorder (ADHD)     Depression     Difficulty walking     uses cane at times    Generalized weakness 2014    in left leg since 2014    Mitral valve prolapse     Neurological abnormality     Non-rheumatic mitral valve disease     Numbness     TIA (transient ischemic attack)     in 2021 per PT       Past Surgical History:   Procedure Laterality Date    RECONSTRUCTION, FACIAL  2014    left side near eye brow    RECONSTRUCTION, MYOCUTANEOUS FLAP  02/17/2023    Procedure: RECONSTRUCTION, FASCIOCUTANEOUS FLAP, LEFT LEG;  Surgeon: Lorene Dy, DO;  Location: [facility];  Service: General;;    RELEASE, LOWER EXTREMITY CONTRACTURE  02/17/2023    Procedure: RELEASE, LOWER EXTREMITY CONTRACTURE;  Surgeon: Lorene Dy, DO;  Location: [facility];  Service: General;;    REPAIR, FASCIAL DEFECT Left 02/17/2023    Procedure: REPAIR, FASCIAL DEFECT LEFT LEG;  Surgeon: Lorene Dy, DO;  Location: [facility];  Service: General;  Laterality: Left;    TONSILLECTOMY      as child       Prior Level of Functioning:  Mobility  Rolling: Independent (without device)  Supine to sit: Independent (without device)  Sit to stand: Independent (without device)  Transfers out of bed: Independent (without device)  Transfers to toilet: Independent (without device)  Ambulation: Independent (without device)    Activities of Daily Living  Eating: Independent (without device)  Grooming: Independent (without device)  Bathing upper body: Independent  (without device)  Bathing lower body: Independent (without device)  Dressing upper body: Independent (without device)  Dressing lower body: Independent (without device)  Toileting: Independent (without device)    CARE Tool Items:   Self-Care: Independent  Indoor Mobility (Ambulation): Independent  Stairs: Independent  Cognition: Independent  Devices: None of the given options  Swallowing/Nutritional Status: Regular food  Has the patient had two or more falls in the past year or any falls with injury in the past year?: {yes no unknown:23450}  Did the patient have major surgery during the 100 days prior to admission?: {yes no unknown:23450}    Social History  Marital Status: single  Does patient have children?: No  Employment status: Full-time  Recreational activities/hobbies: ***  Additional social history: ***  Pre-hospital living environment: ***  Language: English  Hand dominance: {LEFT/RIGHT:304401470}      The following information was gathered for consideration and maintenance in the medical record to substantiate medical necessity for an IRF level of care.    This assessment is being completed by Tennis Ship, PT. Lenon Kuennen is currently at ***. Jonavon Trieu is being referred and recommended by their physician, ***, to be assessed both  medically and functionally in regard to their premorbid functional capacity to determine whether they can benefit from a rehabilitation level of care offered by our facility. The following is information regarding the medical complexity and clinical risk factors that need to be considered for the appropriate management of Mena Lienau [facility] care and recovery.     Impairment Group Code: 0003.9 - Neurologic Conditions: Other Neurologic    Etiologic Diagnoses:   - R53.1 - Weakness      The interdisciplinary team will also manage the potential risks and complications from the following comorbid conditions.    Comorbid Conditions: {Comorbid  Conditions:56234}    History of Present Illness: ***  Date of Onset: ***  Date Admitted to Acute: ***  Current Precautions: {Precautions:56126}  Allergies:   Allergies   Allergen Reactions    Shellfish-Derived Products Anaphylaxis    Latex Rash     And swelling       Present Symptoms Summary  Vitals  BP: 134/85 (03/12/2023 12:00 PM)  Temp: 98.5 F (36.9 C) (03/12/2023 12:00 PM)  Temp Source: Oral (03/12/2023 12:00 PM)  Heart Rate: (!) 104 (03/12/2023  4:00 PM)  Resp Rate: (!) 34 (03/12/2023  4:00 PM)  SpO2: 93 % (03/12/2023  4:00 PM)  Height: 1.778 m (5\' 10" ) (03/10/2023  3:07 PM)  Weight: 108.2 kg (238 lb 8.6 oz) (03/12/2023 12:00 AM)      Is patient hard of hearing: {Hard of Hearing:56128}  Hearing aid usage: {Hearing Aid:56127}  Does patient have vision issues: {Vision Issues:56129}  Other medical comments: ***    Diet Consistency: {Diet Consistency:56108}  Diet Type: {Diet Type:56109}  Liquid Consistency: {Liquid Consistency:56110}  Bladder: {Bladder:56111}  Last Bowel Movement: ***  Bowel: {Bowel:56112}  Integumentary: {Integumentary:56113}  Cardiopulmonary: {Cardiopulmonary:56116}  Dialysis: {Dialysis:56133}    Medication:   No current facility-administered medications for this visit.     No current outpatient medications on file.     Facility-Administered Medications Ordered in Other Visits   Medication Dose Route Frequency Provider Last Rate Last Admin    amphetamine-dextroamphetamine (ADDERALL) tablet 10 mg  10 mg Oral BID Pyers, Elonda Husky, NP   10 mg at 03/12/23 0849    busPIRone (BUSPAR) tablet 5 mg  5 mg Oral BID Pyers, Elonda Husky, NP   5 mg at 03/12/23 0849    calcium gluconate 1 g in 50 mL premix  1 g Intravenous PRN Pyers, Elonda Husky, NP        diphenhydrAMINE (BENADRYL) capsule 25 mg  25 mg Oral Q6H PRN Perrington, Tremecca E, PA        heparin (porcine) injection 5,000 Units  5,000 Units Subcutaneous [facility] Pyers, Elonda Husky, NP   5,000 Units at 03/12/23 1338    hydrALAZINE (APRESOLINE) injection 10 mg  10 mg  Intravenous Q3H PRN Pyers, Elonda Husky, NP        labetalol (NORMODYNE,TRANDATE) injection 10 mg  10 mg Intravenous Q15 Min PRN Pyers, Elonda Husky, NP        lithium (LITHOBID) CR tablet 300 mg  300 mg Oral TID Pyers, Elonda Husky, NP   300 mg at 03/12/23 1338    magnesium oxide (MAG-OX) tablet 400-800 mg  400-800 mg Oral PRN Pyers, Elonda Husky, NP        Or    magnesium sulfate 1g in dextrose 5% IVPB (premix)  1 g Intravenous PRN Pyers, Elonda Husky, NP        niCARdipine (CARDENE) 25 mg in sodium chloride  0.9 % 100 mL infusion mini-bag plus  0-15 mg/hr Intravenous Continuous PRN Pyers, Elonda Husky, NP        potassium chloride (KLOR-CON M20) CR tablet 0-60 mEq  0-60 mEq Oral PRN Pyers, Elonda Husky, NP        Or    potassium chloride (KLOR-CON) packet 0-60 mEq  0-60 mEq Oral PRN Pyers, Elonda Husky, NP        Or    potassium chloride 10 mEq in 100 mL IVPB (premix)  10 mEq Intravenous PRN Pyers, Elonda Husky, NP        QUEtiapine (SEROquel) tablet 25 mg  25 mg Oral QHS PRN Pyers, Elonda Husky, NP        senna-docusate (PERICOLACE) 8.6-50 MG per tablet 1 tablet  1 tablet Oral QHS Pyers, Kajsa M, NP        sodium phosphates 15 mmol in dextrose 5 % 250 mL IVPB  15 mmol Intravenous PRN Pyers, Elonda Husky, NP        sodium phosphates 25 mmol in dextrose 5 % 250 mL IVPB  25 mmol Intravenous PRN Pyers, Elonda Husky, NP        sodium phosphates 35 mmol in dextrose 5 % 250 mL IVPB  35 mmol Intravenous PRN Pyers, Elonda Husky, NP           Alcohol/Tobacco/Drug Use:  [name] reports current alcohol use. Kaylib Furness reports that he has never smoked. He has never been exposed to tobacco smoke. He has never used smokeless tobacco. [name] reports no history of drug use.    Pain: {ZOXW:96045}    Lab Results (past 2 days):  No results displayed because visit has over 200 results.          Radiology (past 7 days):  CT Head WO Contrast    Result Date: 03/11/2023   Stable unremarkable appearance of the brain. No bleed or appreciable acute infarct.  Trilby Drummer, MD 03/11/2023 2:13 PM    US Venous Low Extrem Duplx Dopp Comp Bilat    Result Date: 03/11/2023   No sonographic evidence for right or left lower extremity deep venous thrombosis. Marty Heck, MD 03/11/2023 9:20 AM    MR Angiogram Head WO Contrast    Result Date: 03/10/2023  1.There is no stenosis of the proximal right internal carotid artery based on NASCET criteria.  2.There is no stenosis of the proximal left internal carotid artery based on NASCET criteria. 3.The vertebrobasilar arterial system is patent. 4.Normal intracranial MR angiogram. Terrilee Croak, MD 03/10/2023 10:10 PM    MR Angiogram Neck W WO Contrast    Result Date: 03/10/2023  1.There is no stenosis of the proximal right internal carotid artery based on NASCET criteria.  2.There is no stenosis of the proximal left internal carotid artery based on NASCET criteria. 3.The vertebrobasilar arterial system is patent. 4.Normal intracranial MR angiogram. Terrilee Croak, MD 03/10/2023 10:10 PM    MRI Brain W WO Contrast    Result Date: 03/10/2023   1.Small foci of T2 prolongation in the anterior left subinsular region also with minor gliosis related to a prior ischemic, infectious inflammatory process. 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 Terrilee Croak, MD 03/10/2023 10:05 PM    CT Head WO Contrast    Result Date: 03/10/2023   1.Normal examination. Terrilee Croak, MD 03/10/2023 6:58 PM    CT Angiogram Head Neck    Result Date: 03/10/2023  1.rCBF<30% images show 0 cc. Tmax>6sec images show 0 cc. 2.The intracranial arteries are patent, no proximal intracranial large vessel occlusion. 3.There is no stenosis of the proximal right internal carotid artery based on NASCET criteria. 4.There is no stenosis of the proximal left internal carotid artery based on NASCET criteria.  Alex Einar Pheasant, MD 03/10/2023 4:04 PM    CT Angiogram Cerebral Perfusion W 3D Reconstruction    Result Date:  03/10/2023  1.rCBF<30% images show 0 cc. Tmax>6sec images show 0 cc. 2.The intracranial arteries are patent, no proximal intracranial large vessel occlusion. 3.There is no stenosis of the proximal right internal carotid artery based on NASCET criteria. 4.There is no stenosis of the proximal left internal carotid artery based on NASCET criteria.  Alex Einar Pheasant, MD 03/10/2023 4:04 PM    CT Head without Contrast    Result Date: 03/10/2023   No acute intracranial abnormality. Georgann Housekeeper, MD 03/10/2023 2:00 PM     IVs: {IVs:56119}    {Is patient currently participating in rehab?:56099}    {Adjustment to Present Illness:56100:::1}    Activity Tolerance: {Good/Fair/Poor:56101}    Special needs: {Special Needs:56120}      Current Functional Status  Weight-bearing status: {Weight Bearing Status:56121}    Mobility  Rolling: {Level of Function:56107}  Supine to sit: {Level of Function:56107}  Sit to stand: {Level of Function:56107}  Transfers out of bed: {Level of Function:56107}  Transfers to toilet: {Level of Function:56107}  Ambulation: {Level of Function:56107}  Ambulation Distance: ***  Assistive device used: ***  Pattern: ***    Activities of Daily Living  Eating: {Level of Function:56107}  Grooming: {Level of Function:56107}  Bathing upper body: {Level of Function:56107}  Bathing lower body: {Level of Function:56107}  Dressing upper body: {Level of Function:56107}  Dressing lower body: {Level of Function:56107}  Toileting: {Level of Function:56107}    Cognition: {Cognition:56122}  Cognitive deficits: {Cognitive Deficits:56123}  Communication deficits: {Communication Deficits:56124}  Swallowing deficits: {yes:21351}  Other impairments: {Other Impairments:56125}    Healthcare Decisions:  Patient is able to understand and make healthcare decisions: {IHS RHB Yes/No Comment:56229}    Payer Source  Primary: {Payer Source:56134}  Secondary: {Payer Source:56134}    Rehab risks/benefits reviewed:  {YES/NO:21936}  Patient/family/caregiver agrees/accepts rehab risks/benefits: {YES/NO:21936}  Rehab literature/brochure provided: {YES ZO:10960}    Is the patient being considered for an arthritic condition to meet the 60% regulatory requirement: {Arthritis Yes No:56103}      Is the patient appropriate for IRF admission (able to tolerate intensive inpatient rehabilitation and has a condition that requires a comprehensive interdisciplinary team)?  {Approval/Non-Approval:56078}

## 2023-03-12 NOTE — Nursing Progress Note (Signed)
Patient transferred to [facility], [address], Rm. 2501 at 1745 via wheelchair w/ RN.    Hand off report given to Clydie Braun, Charity fundraiser.     Patient notified regarding transfer. All belongings sent with patient (backpack, clothes, cell phone and charger. Vital signs stable and appropriate for transfer.    Handoff/NIHSS completed with receiving RN Clydie Braun) at bedside

## 2023-03-12 NOTE — Plan of Care (Signed)
Problem: Moderate/High Fall Risk Score >5  Goal: Patient will remain free of falls  Outcome: Progressing  Flowsheets  Taken 03/12/2023 0800 by Vassie Moselle, RN  High (Greater than 13):   LOW-Fall Interventions Appropriate for Low Fall Risk   LOW-Anticoagulation education for injury risk   MOD-Use of chair-pad alarm when appropriate   MOD-Remain with patient during toileting   MOD-Use of assistive devices -Bedside Commode if appropriate   MOD-Perform dangle, stand, walk (DSW) prior to mobilization   HIGH-Visual cue at entrance to patient's room   HIGH-Bed alarm on at all times while patient in bed   HIGH-Initiate use of floor mats as appropriate   HIGH-Consider use of low bed  Taken 03/11/2023 2003 by Paul Half, RN  Moderate Risk (6-13):   MOD-Consider activation of bed alarm if appropriate   MOD-Apply bed exit alarm if patient is confused   MOD-Floor mat at bedside (where available) if appropriate   MOD-Consider a move closer to Nurses Station   MOD-Remain with patient during toileting   MOD-Place bedside commode and assistive devices out of sight when not in use   MOD-Re-orient confused patients   MOD-Utilize diversion activities   MOD-Perform dangle, stand, walk (DSW) prior to mobilization   MOD-Request PT/OT consult order for patients with gait/mobility impairment   MOD-Use gait belt when appropriate   MOD-include family in multidisciplinary POC discussions   MOD- Consider video monitoring     Problem: Every Day - Stroke  Goal: Neurological status is stable or improving  Outcome: Progressing  Flowsheets (Taken 03/11/2023 2003 by Paul Half, RN)  Neurological status is stable or improving:   Monitor/assess/document neurological assessment (Stroke: every 4 hours)   Monitor/assess NIH Stroke Scale   Re-assess NIH Stroke Scale for any change in status   Observe for seizure activity and initiate seizure precautions if indicated   Perform CAM Assessment     Problem: Pain interferes with ability to  perform ADL  Goal: Pain at adequate level as identified by patient  Outcome: Progressing  Flowsheets (Taken 03/12/2023 1018)  Pain at adequate level as identified by patient:   Identify patient comfort function goal   Assess for risk of opioid induced respiratory depression, including snoring/sleep apnea. Alert healthcare team of risk factors identified.   Assess pain on admission, during daily assessment and/or before any "as needed" intervention(s)   Reassess pain within 30-60 minutes of any procedure/intervention, per Pain Assessment, Intervention, Reassessment (AIR) Cycle   Evaluate if patient comfort function goal is met   Evaluate patient's satisfaction with pain management progress   Consult/collaborate with Pain Service   Offer non-pharmacological pain management interventions   Consult/collaborate with Physical Therapy, Occupational Therapy, and/or Speech Therapy   Include patient/patient care companion in decisions related to pain management as needed

## 2023-03-13 LAB — CBC AND DIFFERENTIAL
Absolute NRBC: 0 10*3/uL (ref 0.00–0.00)
Basophils Absolute Automated: 0.06 10*3/uL (ref 0.00–0.08)
Basophils Automated: 0.7 %
Eosinophils Absolute Automated: 0.35 10*3/uL (ref 0.00–0.44)
Eosinophils Automated: 4.1 %
Hematocrit: 40.5 % (ref 37.6–49.6)
Hgb: 13.7 g/dL (ref 12.5–17.1)
Immature Granulocytes Absolute: 0.02 10*3/uL (ref 0.00–0.07)
Immature Granulocytes: 0.2 %
Instrument Absolute Neutrophil Count: 3.92 10*3/uL (ref 1.10–6.33)
Lymphocytes Absolute Automated: 3.53 10*3/uL — ABNORMAL HIGH (ref 0.42–3.22)
Lymphocytes Automated: 41.3 %
MCH: 27.1 pg (ref 25.1–33.5)
MCHC: 33.8 g/dL (ref 31.5–35.8)
MCV: 80 fL (ref 78.0–96.0)
MPV: 9.6 fL (ref 8.9–12.5)
Monocytes Absolute Automated: 0.67 10*3/uL (ref 0.21–0.85)
Monocytes: 7.8 %
Neutrophils Absolute: 3.92 10*3/uL (ref 1.10–6.33)
Neutrophils: 45.9 %
Nucleated RBC: 0 /100 WBC (ref 0.0–0.0)
Platelets: 326 10*3/uL (ref 142–346)
RBC: 5.06 10*6/uL (ref 4.20–5.90)
RDW: 12 % (ref 11–15)
WBC: 8.55 10*3/uL (ref 3.10–9.50)

## 2023-03-13 LAB — COMPREHENSIVE METABOLIC PANEL
ALT: 41 U/L (ref 0–55)
AST (SGOT): 16 U/L (ref 5–41)
Albumin/Globulin Ratio: 1.7 (ref 0.9–2.2)
Albumin: 3.7 g/dL (ref 3.5–5.0)
Alkaline Phosphatase: 72 U/L (ref 37–117)
Anion Gap: 6 (ref 5.0–15.0)
BUN: 16 mg/dL (ref 9.0–28.0)
Bilirubin, Total: 0.3 mg/dL (ref 0.2–1.2)
CO2: 24 mEq/L (ref 17–29)
Calcium: 9 mg/dL (ref 8.5–10.5)
Chloride: 111 mEq/L (ref 99–111)
Creatinine: 0.8 mg/dL (ref 0.5–1.5)
Globulin: 2.2 g/dL (ref 2.0–3.6)
Glucose: 102 mg/dL — ABNORMAL HIGH (ref 70–100)
Potassium: 4 mEq/L (ref 3.5–5.3)
Protein, Total: 5.9 g/dL — ABNORMAL LOW (ref 6.0–8.3)
Sodium: 141 mEq/L (ref 135–145)
eGFR: >60 mL/min/{1.73_m2} (ref >=60)

## 2023-03-13 LAB — ECG 12-LEAD
Atrial Rate: 91 {beats}/min
IHS MUSE NARRATIVE AND IMPRESSION: NORMAL
P Axis: 45 degrees
P-R Interval: 172 ms
Q-T Interval: 350 ms
QRS Duration: 94 ms
QTC Calculation (Bezet): 430 ms
R Axis: 71 degrees
T Axis: -2 degrees
Ventricular Rate: 91 {beats}/min

## 2023-03-13 LAB — RENAL FUNCTION PANEL
Albumin: 3.6 g/dL (ref 3.5–5.0)
Anion Gap: 7 (ref 5.0–15.0)
BUN: 16 mg/dL (ref 9.0–28.0)
CO2: 23 mEq/L (ref 17–29)
Calcium: 9 mg/dL (ref 8.5–10.5)
Chloride: 112 mEq/L — ABNORMAL HIGH (ref 99–111)
Creatinine: 0.8 mg/dL (ref 0.5–1.5)
Glucose: 101 mg/dL — ABNORMAL HIGH (ref 70–100)
Phosphorus: 5 mg/dL — ABNORMAL HIGH (ref 2.3–4.7)
Potassium: 4 mEq/L (ref 3.5–5.3)
Sodium: 142 mEq/L (ref 135–145)
eGFR: >60 mL/min/{1.73_m2} (ref >=60)

## 2023-03-13 LAB — CBC
Absolute NRBC: 0 10*3/uL (ref 0.00–0.00)
Hematocrit: 41.5 % (ref 37.6–49.6)
Hgb: 14.3 g/dL (ref 12.5–17.1)
MCH: 27.2 pg (ref 25.1–33.5)
MCHC: 34.5 g/dL (ref 31.5–35.8)
MCV: 79 fL (ref 78.0–96.0)
MPV: 9.5 fL (ref 8.9–12.5)
Nucleated RBC: 0 /100 WBC (ref 0.0–0.0)
Platelets: 349 10*3/uL — ABNORMAL HIGH (ref 142–346)
RBC: 5.25 10*6/uL (ref 4.20–5.90)
RDW: 12 % (ref 11–15)
WBC: 8.6 10*3/uL (ref 3.10–9.50)

## 2023-03-13 LAB — MAGNESIUM: Magnesium: 2.1 mg/dL (ref 1.6–2.6)

## 2023-03-13 LAB — PHOSPHORUS: Phosphorus: 5.1 mg/dL — ABNORMAL HIGH (ref 2.3–4.7)

## 2023-03-13 NOTE — Plan of Care (Signed)
NURSING SHIFT NOTE     Patient: Kurt Rogers  Day: 0      SHIFT EVENTS     Shift Narrative/Significant Events (PRN med administration, fall, RRT, etc.):     Pt alert and oriented X4. On room air. Plan of care discussed with pt. NIH and neuro check done per protocol. All med given as ordered. Continue monitoring.   Safety and fall precautions remain in place. Purposeful rounding completed.          ASSESSMENT     Changes in assessment from patient's baseline this shift:    Neuro: No  CV: No  Pulm: No  Peripheral Vascular: No  HEENT: No  GI: No  BM during shift: No, Last BM: Last BM Date: 03/12/23  GU: No   Integ: No  MS: No  Pain: None  Pain Interventions: None  Medications Utilized:   Mobility: PMP Activity: Step 6 - Walks in Room of Distance Walked (ft) (Step 6,7): 5 Feet           Lines     Patient Lines/Drains/Airways Status       Active Lines, Drains and Airways       Name Placement date Placement time Site Days    Peripheral IV 03/10/23 20 G Left Antecubital 03/10/23  1337  Antecubital  2    Peripheral IV 03/10/23 18 G Diffusion Anterior;Distal;Right Antecubital 03/10/23  1355  Antecubital  2                         VITAL SIGNS     Vitals:    03/13/23 0755   BP: 117/75   Pulse: 63   Resp: 18   Temp: 98.2 F (36.8 C)   SpO2: 99%       Temp  Min: 98.1 F (36.7 C)  Max: 98.6 F (37 C)  Pulse  Min: 63  Max: 104  Resp  Min: 16  Max: 34  BP  Min: 91/59  Max: 135/81  SpO2  Min: 93 %  Max: 99 %      Intake/Output Summary (Last 24 hours) at 03/13/2023 1124  Last data filed at 03/12/2023 1912  Gross per 24 hour   Intake --   Output 450 ml   Net -450 ml          CARE PLAN       Problem: Every Day - Stroke  Goal: Core/Quality measure requirements - Daily  Outcome: Progressing  Flowsheets (Taken 03/13/2023 1111)  Core/Quality measure requirements - Daily:   VTE Prevention: Ensure anticoagulant(s) administered and/or anti-embolism stockings/devices documented by end of day 2   Ensure antithrombotic administered or  contraindication documented by LIP by end of day 2   Once lipid panel has resulted, check LDL. Contact provider for statin order if LDL > 70 (or ensure contraindication documented by LIP).   Continue stroke education (must include Modifiable Risk Factors, Warning Signs and Symptoms of Stroke, Activation of Emergency Medical System and Follow-up Appointments). Ensure handout has been given and documented.     Problem: Every Day - Stroke  Goal: Neurological status is stable or improving  Outcome: Progressing  Flowsheets (Taken 03/13/2023 1122)  Neurological status is stable or improving:   Monitor/assess/document neurological assessment (Stroke: every 4 hours)   Re-assess NIH Stroke Scale for any change in status   Monitor/assess NIH Stroke Scale   Observe for seizure activity and initiate seizure precautions if indicated  Perform CAM Assessment     Problem: Every Day - Stroke  Goal: Stable vital signs and fluid balance  Outcome: Progressing  Flowsheets (Taken 03/13/2023 1122)  Stable vital signs and fluid balance:   Position patient for maximum circulation/cardiac output   Monitor intake and output. Notify LIP if urine output is < 30 mL/hour.   Monitor and assess vitals every 4 hours or as ordered and hemodynamic parameters   Encourage oral fluid intake   Apply telemetry monitor as ordered     Problem: Day of Discharge - Stroke  Goal: Able to express understanding of discharge instructions and stroke education  Outcome: Progressing  Flowsheets (Taken 03/13/2023 1122)  Able to express understanding of discharge instructions and stroke education:   Provide alternative method of communication if needed   Include patient care companion in decisions related to communication   Patient/patient care companion demonstrates understanding on disease process, treatment plan, medications and discharge plan   Ensure "Discharge instructions: Stroke" appears on discharge paperwork  (After Visit Summary-AVS)   Consult/collaborate with  Case Management/Social Work     Problem: Every Day - Stroke  Goal: Patient will maintain adequate oxygenation  Flowsheets (Taken 03/13/2023 1122)  Patient will maintain adequate oxygenation:   Suction secretions as needed   Sleep Apnea: Encourage patient to speak to PCP regarding sleep apnea/sleep study ([place] only)   Maintain SpO2 of greater than 92%   Sleep Apnea: Assess if previously tested or on CPAP     Problem: Day of Discharge - Stroke  Goal: Core/Quality measures - Discharge  Flowsheets (Taken 03/13/2023 1122)  Core/Quality measures - Discharge:   Document discharge NIH Stroke Scale   Document Modified Rankin Scale ([place] only)   Ensure antithrombotic ordered or contraindication documented by LIP   Ensure statin ordered or contraindication documented by LIP   If diagnosis or history of A-fib/A-flutter, ensure anticoagulation ordered or contraindication documented by LIP     Problem: Safety  Goal: Patient will be free from injury during hospitalization  Flowsheets (Taken 03/13/2023 1122)  Patient will be free from injury during hospitalization:   Assess patient's risk for falls and implement fall prevention plan of care per policy   Include patient/ family/ care giver in decisions related to safety   Assess for patients risk for elopement and implement Elopement Risk Plan per policy   Ensure appropriate safety devices are available at the bedside   Provide and maintain safe environment   Hourly rounding   Use appropriate transfer methods

## 2023-03-13 NOTE — Plan of Care (Signed)
NURSING SHIFT NOTE     Patient: Kurt Rogers  Day: 0      SHIFT EVENTS     Patient awake alert and oriented. Room air , denies pain and SOB   Attached to the tele normal sinus rhythm    Plan of care on going ,Will continue to monitor vital signs and lab results     Safety and fall precautions remain in place. Purposeful rounding completed.          ASSESSMENT     Changes in assessment from patient's baseline this shift:    Neuro: No  CV: No  Pulm: No  Peripheral Vascular: No  HEENT: No  GI: No  BM during shift: No, Last BM: Last BM Date: 03/12/23  GU: No   Integ: No  MS: No    Pain: None    Mobility: PMP Activity: Step 6 - Walks in Room of Distance Walked (ft) (Step 6,7): 5 Feet           Lines     Patient Lines/Drains/Airways Status       Active Lines, Drains and Airways       Name Placement date Placement time Site Days    Peripheral IV 03/10/23 20 G Left Antecubital 03/10/23  1337  Antecubital  3    Peripheral IV 03/10/23 18 G Diffusion Anterior;Distal;Right Antecubital 03/10/23  1355  Antecubital  3                         VITAL SIGNS     Vitals:    03/13/23 1959   BP: 134/84   Pulse: 83   Resp: 16   Temp: 99.5 F (37.5 C)   SpO2: 96%       Temp  Min: 98.2 F (36.8 C)  Max: 99.5 F (37.5 C)  Pulse  Min: 63  Max: 94  Resp  Min: 16  Max: 18  BP  Min: 91/59  Max: 134/84  SpO2  Min: 95 %  Max: 99 %      Intake/Output Summary (Last 24 hours) at 03/13/2023 2033  Last data filed at 03/13/2023 1500  Gross per 24 hour   Intake --   Output 750 ml   Net -750 ml              CARE PLAN        Problem: Moderate/High Fall Risk Score >5  Goal: Patient will remain free of falls  Outcome: Progressing  Flowsheets  Taken 03/13/2023 0800 by Cathi Roan, RN  Moderate Risk (6-13): MOD-Remain with patient during toileting  Taken 03/12/2023 0800 by Vassie Moselle, RN  High (Greater than 13):   LOW-Fall Interventions Appropriate for Low Fall Risk   LOW-Anticoagulation education for injury risk   MOD-Use of chair-pad alarm when  appropriate   MOD-Remain with patient during toileting   MOD-Use of assistive devices -Bedside Commode if appropriate   MOD-Perform dangle, stand, walk (DSW) prior to mobilization   HIGH-Visual cue at entrance to patient's room   HIGH-Bed alarm on at all times while patient in bed   HIGH-Initiate use of floor mats as appropriate   HIGH-Consider use of low bed     Problem: Compromised Sensory Perception  Goal: Sensory Perception Interventions  Outcome: Progressing  Flowsheets (Taken 03/13/2023 2000)  Sensory Perception Interventions: Offload heels, Pad bony prominences, Reposition q 2hrs/turn Clock, Q2 hour skin assessment under devices if present  Problem: Compromised Activity/Mobility  Goal: Activity/Mobility Interventions  Outcome: Progressing  Flowsheets (Taken 03/13/2023 2000)  Activity/Mobility Interventions: Pad bony prominences, TAP Seated positioning system when OOB, Promote PMP, Reposition q 2 hrs / turn clock, Offload heels     Problem: Every Day - Stroke  Goal: Core/Quality measure requirements - Daily  Outcome: Progressing  Flowsheets (Taken 03/13/2023 1111 by Cathi Roan, RN)  Core/Quality measure requirements - Daily:   VTE Prevention: Ensure anticoagulant(s) administered and/or anti-embolism stockings/devices documented by end of day 2   Ensure antithrombotic administered or contraindication documented by LIP by end of day 2   Once lipid panel has resulted, check LDL. Contact provider for statin order if LDL > 70 (or ensure contraindication documented by LIP).   Continue stroke education (must include Modifiable Risk Factors, Warning Signs and Symptoms of Stroke, Activation of Emergency Medical System and Follow-up Appointments). Ensure handout has been given and documented.  Goal: Neurological status is stable or improving  Outcome: Progressing  Flowsheets (Taken 03/13/2023 1122 by Cathi Roan, RN)  Neurological status is stable or improving:   Monitor/assess/document neurological assessment (Stroke:  every 4 hours)   Re-assess NIH Stroke Scale for any change in status   Monitor/assess NIH Stroke Scale   Observe for seizure activity and initiate seizure precautions if indicated   Perform CAM Assessment  Goal: Mobility/Activity is maintained at optimal level for patient  Outcome: Progressing  Flowsheets (Taken 03/11/2023 2003 by Paul Half, RN)  Mobility/activity is maintained at optimal level for patient:   Encourage independent activity per ability   Consult/collaborate with Physical Therapy and/or Occupational Therapy  Goal: Skin integrity is maintained or improved  Outcome: Progressing  Flowsheets (Taken 03/10/2023 2005 by Julien Girt, RN)  Skin integrity is maintained or improved:   Assess Braden Scale every shift   Turn or reposition patient every 2 hours or as needed unless able to reposition self   Increase activity as tolerated/progressive mobility   Relieve pressure to bony prominences   Avoid shearing  Goal: Effective coping demonstrated  Outcome: Progressing  Flowsheets (Taken 03/11/2023 1638 by Lyda Jester, RN)  Effective coping demonstrated:   Assess/report to LIP uncontrolled anxiety, depression, or ineffective coping   Offer reassurance to decrease anxiety

## 2023-03-13 NOTE — Progress Notes (Signed)
CM spoke with Kurt Rogers, liaison for [facility] today. Insurance authorization is still pending. Unlikely to hear back today, but liaison will call CM if any decision comes back on insurance authorization.    Kurt Rogers, MSW, ACM-SW  Social Worker Case Manager II  [facility]

## 2023-03-13 NOTE — Progress Notes (Addendum)
[facility]  PROGRESS NOTE      Patient: Kurt Rogers  Date: 03/13/2023   LOS: 0 Days  Admission Date: 03/10/2023   MRN: [id number]  Attending: Buelah Manis, MD  Please page me using Epic SecureChat       ASSESSMENT/PLAN     Kurt Rogers is a 32 y.o. male admitted with Left-sided weakness.     Left-sided weakness/tremor status post thrombolysis  MRI negative, stroke rule out, possible suspected conversion disorder  MRI unremarkable as well as MRA of head and neck unremarkable  Continue neurochecks every 4 hours  Lipid panel, hemoglobin A1c TSH unremarkable  Psych consulted for further evaluation -no change of medications recommended.  PT OT recommended acute rehab, pending authorization    Anxiety and depression  Continue home psych medications, BuSpar, Seroquel    Mood disorder unspecified possibly bipolar  Continue home medications lithium, lithium level 0.36.    History of ADHD  Continue home medication, Adderall    Nutrition:    Regular    DVT Prophylaxis: Heparin    Code Status: Full Code    Chart Review and Discussion:   The following chart items were reviewed as of 3:52 PM on 03/13/23:  [x]  Lab Results [x]  Imaging Results   []  Problem List  [x]  Current Orders [x]  Current Medications  []  Allergies  []  Code Status []  Advanced Care Planning  []  SDoH        The management and plan of care for this patient was discussed with the following specialty consultants:  []  Cardiology  [] Gastroenterology                 []  Infectious Disease  []  Pulmonology [x]  Neurology                []  Nephrology  []  Neurosurgery []  Orthopedic Surgery  []  Heme/Onc  []  General Surgery [x]  Psychiatry                                   []  Palliative       SUBJECTIVE     No acute events overnight.     MEDICATIONS     Current Facility-Administered Medications   Medication Dose Route Frequency    amphetamine-dextroamphetamine  10 mg Oral BID    busPIRone  5 mg Oral BID    heparin (porcine)  5,000 Units  Subcutaneous Q8H SCH    lithium  300 mg Oral TID    senna-docusate  1 tablet Oral QHS       PHYSICAL EXAM     Vitals:    03/13/23 1531   BP: 127/78   Pulse: 84   Resp: 17   Temp: 99 F (37.2 C)   SpO2: 97%       Temperature: Temp  Min: 98.1 F (36.7 C)  Max: 99 F (37.2 C)  Pulse: Pulse  Min: 63  Max: 104  Respiratory: Resp  Min: 16  Max: 34  Non-Invasive BP: BP  Min: 91/59  Max: 135/81  Pulse Oximetry SpO2  Min: 93 %  Max: 99 %    Intake and Output Summary (Last 24 hours) at Date Time    Intake/Output Summary (Last 24 hours) at 03/13/2023 1552  Last data filed at 03/13/2023 1500  Gross per 24 hour   Intake --   Output 1200 ml   Net -1200 ml  GEN APPEARANCE: Patient resting in bed in NAD  HEENT: No scleral icterus, MMM  CVS: RRR, S1, S2; No M/G/R  LUNGS: CTAB; No Wheezes; No Rhonchi: No rales  ABD: Soft; No TTP; + Normoactive BS, no rebound or guarding  EXT: WWP,   Skin exam:  No gross lesions noted on exposed skin surfaces  MENTAL STATUS:  Answers questions appropriately, responds to commands        LABS     Recent Labs   Lab 03/13/23  0317 03/12/23  0359 03/11/23  0315   WBC 8.55  8.60 9.07 8.91   RBC 5.06  5.25 5.24 5.52   Hgb 13.7  14.3 14.5 15.0   Hematocrit 40.5  41.5 41.6 43.7   MCV 80.0  79.0 79.4 79.2   Platelets 326  349* 337 352*       Recent Labs   Lab 03/13/23  0317 03/12/23  0446 03/12/23  0359 03/11/23  0310 03/10/23  1732   Sodium 141  142 141 147* 141 139   Potassium 4.0  4.0 4.1 1.9* 4.2 3.9   Chloride 111  112* 110 133* 106 106   CO2 24  23 22  10* 24 23   BUN 16.0  16.0 16.0 9.0 14.0 12.0   Creatinine 0.8  0.8 0.8 0.4* 1.1 0.9   Glucose 102*  101* 108* 54* 102* 90   Calcium 9.0  9.0 9.1 4.0* 9.6 9.6   Magnesium 2.1  --  1.8 2.3  --        Recent Labs   Lab 03/13/23  0317 03/12/23  0359 03/11/23  0310 03/10/23  1732 03/10/23  1429   ALT 41  --   --  65* 78*   AST (SGOT) 16  --   --  28 33   Bilirubin, Total 0.3  --   --  0.6 0.6   Albumin 3.7  3.6 1.6* 3.9 4.2 4.8   Alkaline  Phosphatase 72  --   --  72 82       Recent Labs   Lab 03/11/23  0310 03/10/23  1732   hs Troponin-I <2.7 <2.7   hs Troponin-I Delta  --  Unable toCalc.       Recent Labs   Lab 03/11/23  0310 03/10/23  1429   PT INR 1.1 1.0   PT 12.6 12.1   PTT 34 34       Microbiology Results (last 15 days)       Procedure Component Value Units Date/Time    COVID-19 (SARS-CoV-2) and Influenza A/B, NAA (Liat Rapid)- Admission [[id number]] Collected: 03/11/23 0729    Order Status: Completed Specimen: Culturette from Nasopharyngeal Updated: 03/11/23 0814     Purpose of COVID testing Diagnostic -[facility]     SARS-CoV-2 Specimen Source Nasal Swab     SARS CoV 2 Overall Result Not Detected     Comment: __________________________________________________  -A result of "Detected" indicates POSITIVE for the    presence of SARS CoV-2 RNA  -A result of "Not Detected" indicates NEGATIVE for the    presence of SARS CoV-2 RNA  __________________________________________________________  Test performed using the Roche cobas Liat SARS-CoV-2 assay. This assay is  only for use under the Food and Drug Administration's Emergency Use  Authorization. This is a real-time RT-PCR assay for the qualitative  detection of SARS-CoV-2 RNA. Viral nucleic acids may persist in vivo,  independent of viability. Detection of viral nucleic acid does not imply the  presence of infectious virus, or that virus nucleic acid is the cause of  clinical symptoms. Negative results do not preclude SARS-CoV-2 infection and  should not be used as the sole basis for diagnosis, treatment or other  patient management decisions. Negative results must be combined with  clinical observations, patient history, and/or epidemiological information.  Invalid results may be due to inhibiting substances in the specimen and  recollection should occur. Please see Fact Sheets for patients and providers  located:  WirelessDSLBlog.no          Influenza A Not Detected      Influenza B Not Detected     Comment: Test performed using the Roche cobas Liat SARS-CoV-2 & Influenza A/B assay.  This assay is only for use under the Food and Drug Administration's  Emergency Use Authorization. This is a multiplex real-time RT-PCR assay  intended for the simultaneous in vitro qualitative detection and  differentiation of SARS-CoV-2, influenza A, and influenza B virus RNA. Viral  nucleic acids may persist in vivo, independent of viability. Detection of  viral nucleic acid does not imply the presence of infectious virus, or that  virus nucleic acid is the cause of clinical symptoms. Negative results do  not preclude SARS-CoV-2, influenza A, and/or influenza B infection and  should not be used as the sole basis for diagnosis, treatment or other  patient management decisions. Negative results must be combined with  clinical observations, patient history, and/or epidemiological information.  Invalid results may be due to inhibiting substances in the specimen and  recollection should occur. Please see Fact Sheets for patients and providers  located: http://www.rice.biz/.         Narrative:      o Collect and clearly label specimen type:  o PREFERRED-Upper respiratory specimen: One Nasal Swab in  Transport Media.  o Hand deliver to laboratory ASAP  Diagnostic -[facility]             RADIOLOGY     CT Head WO Contrast    Result Date: 03/11/2023   Stable unremarkable appearance of the brain. No bleed or appreciable acute infarct. Trilby Drummer, MD 03/11/2023 2:13 PM    US Venous Low Extrem Duplx Dopp Comp Bilat    Result Date: 03/11/2023   No sonographic evidence for right or left lower extremity deep venous thrombosis. Marty Heck, MD 03/11/2023 9:20 AM    MR Angiogram Head WO Contrast    Result Date: 03/10/2023  1.There is no stenosis of the proximal right internal carotid artery based on NASCET criteria.  2.There is no stenosis of the proximal left internal carotid artery based on NASCET criteria.  3.The vertebrobasilar arterial system is patent. 4.Normal intracranial MR angiogram. Terrilee Croak, MD 03/10/2023 10:10 PM    MR Angiogram Neck W WO Contrast    Result Date: 03/10/2023  1.There is no stenosis of the proximal right internal carotid artery based on NASCET criteria.  2.There is no stenosis of the proximal left internal carotid artery based on NASCET criteria. 3.The vertebrobasilar arterial system is patent. 4.Normal intracranial MR angiogram. Terrilee Croak, MD 03/10/2023 10:10 PM    MRI Brain W WO Contrast    Result Date: 03/10/2023   1.Small foci of T2 prolongation in the anterior left subinsular region also with minor gliosis related to a prior ischemic, infectious inflammatory process. 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 Terrilee Croak, MD 03/10/2023 10:05 PM    CT Head WO Contrast  Result Date: 03/10/2023   1.Normal examination. Terrilee Croak, MD 03/10/2023 6:58 PM    CT Angiogram Head Neck    Result Date: 03/10/2023  1.rCBF<30% images show 0 cc. Tmax>6sec images show 0 cc. 2.The intracranial arteries are patent, no proximal intracranial large vessel occlusion. 3.There is no stenosis of the proximal right internal carotid artery based on NASCET criteria. 4.There is no stenosis of the proximal left internal carotid artery based on NASCET criteria.  Alex Einar Pheasant, MD 03/10/2023 4:04 PM    CT Angiogram Cerebral Perfusion W 3D Reconstruction    Result Date: 03/10/2023  1.rCBF<30% images show 0 cc. Tmax>6sec images show 0 cc. 2.The intracranial arteries are patent, no proximal intracranial large vessel occlusion. 3.There is no stenosis of the proximal right internal carotid artery based on NASCET criteria. 4.There is no stenosis of the proximal left internal carotid artery based on NASCET criteria.  Alex Einar Pheasant, MD 03/10/2023 4:04 PM    CT Head without Contrast    Result Date: 03/10/2023   No acute intracranial abnormality.  Georgann Housekeeper, MD 03/10/2023 2:00 PM       Signed,  Buelah Manis, MD  3:52 PM 03/13/2023

## 2023-03-14 ENCOUNTER — Observation Stay: Payer: No Typology Code available for payment source

## 2023-03-14 LAB — MAGNESIUM: Magnesium: 2.1 mg/dL (ref 1.6–2.6)

## 2023-03-14 LAB — COMPREHENSIVE METABOLIC PANEL
ALT: 43 U/L (ref 0–55)
AST (SGOT): 19 U/L (ref 5–41)
Albumin/Globulin Ratio: 1.6 (ref 0.9–2.2)
Albumin: 3.9 g/dL (ref 3.5–5.0)
Alkaline Phosphatase: 74 U/L (ref 37–117)
Anion Gap: 6 (ref 5.0–15.0)
BUN: 14 mg/dL (ref 9.0–28.0)
Bilirubin, Total: 0.3 mg/dL (ref 0.2–1.2)
CO2: 23 mEq/L (ref 17–29)
Calcium: 9.4 mg/dL (ref 8.5–10.5)
Chloride: 111 mEq/L (ref 99–111)
Creatinine: 0.9 mg/dL (ref 0.5–1.5)
Globulin: 2.5 g/dL (ref 2.0–3.6)
Glucose: 98 mg/dL (ref 70–100)
Potassium: 4.1 mEq/L (ref 3.5–5.3)
Protein, Total: 6.4 g/dL (ref 6.0–8.3)
Sodium: 140 mEq/L (ref 135–145)
eGFR: >60 mL/min/{1.73_m2} (ref >=60)

## 2023-03-14 LAB — CBC AND DIFFERENTIAL
Absolute NRBC: 0 10*3/uL (ref 0.00–0.00)
Basophils Absolute Automated: 0.06 10*3/uL (ref 0.00–0.08)
Basophils Automated: 0.6 %
Eosinophils Absolute Automated: 0.33 10*3/uL (ref 0.00–0.44)
Eosinophils Automated: 3.4 %
Hematocrit: 42.2 % (ref 37.6–49.6)
Hgb: 15 g/dL (ref 12.5–17.1)
Immature Granulocytes Absolute: 0.02 10*3/uL (ref 0.00–0.07)
Immature Granulocytes: 0.2 %
Instrument Absolute Neutrophil Count: 5.14 10*3/uL (ref 1.10–6.33)
Lymphocytes Absolute Automated: 3.55 10*3/uL — ABNORMAL HIGH (ref 0.42–3.22)
Lymphocytes Automated: 36.6 %
MCH: 27.9 pg (ref 25.1–33.5)
MCHC: 35.5 g/dL (ref 31.5–35.8)
MCV: 78.6 fL (ref 78.0–96.0)
MPV: 9.3 fL (ref 8.9–12.5)
Monocytes Absolute Automated: 0.6 10*3/uL (ref 0.21–0.85)
Monocytes: 6.2 %
Neutrophils Absolute: 5.14 10*3/uL (ref 1.10–6.33)
Neutrophils: 53 %
Nucleated RBC: 0 /100 WBC (ref 0.0–0.0)
Platelets: 354 10*3/uL — ABNORMAL HIGH (ref 142–346)
RBC: 5.37 10*6/uL (ref 4.20–5.90)
RDW: 12 % (ref 11–15)
WBC: 9.7 10*3/uL — ABNORMAL HIGH (ref 3.10–9.50)

## 2023-03-14 LAB — PHOSPHORUS: Phosphorus: 5.4 mg/dL — ABNORMAL HIGH (ref 2.3–4.7)

## 2023-03-14 MED ORDER — LORAZEPAM 0.5 MG PO TABS
0.5000 mg | ORAL_TABLET | Freq: Once | ORAL | Status: DC
Start: 2023-03-14 — End: 2023-03-14

## 2023-03-14 MED ORDER — LORAZEPAM 1 MG PO TABS
1.0000 mg | ORAL_TABLET | ORAL | Status: AC
Start: 2023-03-14 — End: 2023-03-14
  Administered 2023-03-14: 1 mg via ORAL
  Filled 2023-03-14: qty 1

## 2023-03-14 NOTE — Plan of Care (Signed)
NURSING SHIFT NOTE     Patient: Kurt Rogers  Day: 0      SHIFT EVENTS     Shift Narrative/Significant Events (PRN med administration, fall, RRT, etc.):     Pt alert and oriented X4. On room air plan of care discussed with the pt. Trio rounding done with MD. NIH and neuro check done. Continue monitoring.  Safety and fall precautions remain in place. Purposeful rounding completed.          ASSESSMENT     Changes in assessment from patient's baseline this shift:    Neuro: No  CV: No  Pulm: No  Peripheral Vascular: No  HEENT: No  GI: No  BM during shift: No, Last BM: Last BM Date: 03/12/23  GU: No   Integ: No  MS: No  Pain: None  Pain Interventions: None  Medications Utilized:   Mobility: PMP Activity: Step 6 - Walks in Room of Distance Walked (ft) (Step 6,7): 5 Feet           Lines     Patient Lines/Drains/Airways Status       Active Lines, Drains and Airways       Name Placement date Placement time Site Days    Peripheral IV 03/10/23 20 G Left Antecubital 03/10/23  1337  Antecubital  3    Peripheral IV 03/10/23 18 G Diffusion Anterior;Distal;Right Antecubital 03/10/23  1355  Antecubital  3                         VITAL SIGNS     Vitals:    03/14/23 0812   BP: 116/74   Pulse: 71   Resp: 17   Temp: 98.2 F (36.8 C)   SpO2: 98%       Temp  Min: 97.7 F (36.5 C)  Max: 99.5 F (37.5 C)  Pulse  Min: 71  Max: 84  Resp  Min: 16  Max: 18  BP  Min: 105/64  Max: 134/84  SpO2  Min: 95 %  Max: 98 %      Intake/Output Summary (Last 24 hours) at 03/14/2023 6440  Last data filed at 03/13/2023 1959  Gross per 24 hour   Intake --   Output 1150 ml   Net -1150 ml          CARE PLAN       Problem: Every Day - Stroke  Goal: Core/Quality measure requirements - Daily  Outcome: Progressing  Flowsheets (Taken 03/14/2023 0950)  Core/Quality measure requirements - Daily:   VTE Prevention: Ensure anticoagulant(s) administered and/or anti-embolism stockings/devices documented by end of day 2   Ensure antithrombotic administered or  contraindication documented by LIP by end of day 2   Once lipid panel has resulted, check LDL. Contact provider for statin order if LDL > 70 (or ensure contraindication documented by LIP).   Continue stroke education (must include Modifiable Risk Factors, Warning Signs and Symptoms of Stroke, Activation of Emergency Medical System and Follow-up Appointments). Ensure handout has been given and documented.     Problem: Every Day - Stroke  Goal: Neurological status is stable or improving  Outcome: Progressing  Flowsheets (Taken 03/14/2023 0950)  Neurological status is stable or improving:   Monitor/assess/document neurological assessment (Stroke: every 4 hours)   Re-assess NIH Stroke Scale for any change in status   Monitor/assess NIH Stroke Scale   Observe for seizure activity and initiate seizure precautions if indicated   Perform  CAM Assessment     Problem: Every Day - Stroke  Goal: Stable vital signs and fluid balance  Outcome: Progressing  Flowsheets (Taken 03/14/2023 0950)  Stable vital signs and fluid balance:   Position patient for maximum circulation/cardiac output   Monitor intake and output. Notify LIP if urine output is < 30 mL/hour.   Monitor and assess vitals every 4 hours or as ordered and hemodynamic parameters   Encourage oral fluid intake   Apply telemetry monitor as ordered     Problem: Safety  Goal: Patient will be free from injury during hospitalization  Flowsheets (Taken 03/14/2023 0950)  Patient will be free from injury during hospitalization:   Assess patient's risk for falls and implement fall prevention plan of care per policy   Include patient/ family/ care giver in decisions related to safety   Provide alternative method of communication if needed (communication boards, writing)   Ensure appropriate safety devices are available at the bedside   Use appropriate transfer methods   Hourly rounding   Assess for patients risk for elopement and implement Elopement Risk Plan per policy

## 2023-03-14 NOTE — Progress Notes (Signed)
[facility]  PROGRESS NOTE      Patient: Kurt Rogers  Date: 03/14/2023   LOS: 0 Days  Admission Date: 03/10/2023   MRN: [id number]  Attending: Ronie Spies, MD  Please page me using Epic SecureChat       ASSESSMENT/PLAN     Kurt Rogers is a 32 y.o. male admitted with Left-sided weakness.     Left-sided weakness/tremor status post thrombolysis  MRI negative, stroke rule out, possible suspected conversion disorder  MRI unremarkable as well as MRA of head and neck unremarkable  Continue neurochecks every 4 hours  Lipid panel, hemoglobin A1c TSH unremarkable  Psych consulted for further evaluation -no change of medications recommended.  PT OT recommended acute rehab, pending authorization  -Reviewed case with Dr. Moise Boring today, reviewed today's CBC and BMP, patient's hospital course.  Per Dr. Moise Boring, patient's MRI/clinical course currently not consistent with an acute organic process.    Anxiety and depression  Continue home psych medications, BuSpar, Seroquel    Mood disorder unspecified possibly bipolar  Continue home medications lithium, lithium level 0.36.    History of ADHD  Continue home medication, Adderall    Nutrition:    Regular    DVT Prophylaxis: Heparin    Code Status: Full Code    Chart Review and Discussion:   The following chart items were reviewed as of 5:56 PM on 03/14/23:  [x]  Lab Results [x]  Imaging Results   []  Problem List  [x]  Current Orders [x]  Current Medications  []  Allergies  []  Code Status []  Advanced Care Planning  []  SDoH        The management and plan of care for this patient was discussed with the following specialty consultants:  []  Cardiology  [] Gastroenterology                 []  Infectious Disease  []  Pulmonology [x]  Neurology                []  Nephrology  []  Neurosurgery []  Orthopedic Surgery  []  Heme/Onc  []  General Surgery []  Psychiatry                                   []  Palliative       SUBJECTIVE     No acute events overnight.  Seen on trio  rounding.    MEDICATIONS     Current Facility-Administered Medications   Medication Dose Route Frequency    amphetamine-dextroamphetamine  10 mg Oral BID    busPIRone  5 mg Oral BID    heparin (porcine)  5,000 Units Subcutaneous Q8H SCH    lithium  300 mg Oral TID    senna-docusate  1 tablet Oral QHS       PHYSICAL EXAM     Vitals:    03/14/23 1554   BP: 114/73   Pulse: 83   Resp: 17   Temp: 98.1 F (36.7 C)   SpO2: 96%       Temperature: Temp  Min: 97.7 F (36.5 C)  Max: 99.5 F (37.5 C)  Pulse: Pulse  Min: 71  Max: 85  Respiratory: Resp  Min: 16  Max: 18  Non-Invasive BP: BP  Min: 114/73  Max: 134/84  Pulse Oximetry SpO2  Min: 96 %  Max: 98 %    Intake and Output Summary (Last 24 hours) at Date Time    Intake/Output Summary (Last 24 hours)  at 03/14/2023 1756  Last data filed at 03/14/2023 1300  Gross per 24 hour   Intake 500 ml   Output 400 ml   Net 100 ml       GEN APPEARANCE: Patient resting in bed in NAD  HEENT: No scleral icterus, MMM  CVS: RRR, S1, S2; No M/G/R  LUNGS: CTAB; No Wheezes; No Rhonchi: No rales  ABD: Soft; No TTP; + Normoactive BS, no rebound or guarding  EXT: WWP,   Skin exam:  No gross lesions noted on exposed skin surfaces  MENTAL STATUS:  Answers questions appropriately, responds to commands        LABS     Recent Labs   Lab 03/14/23  0312 03/13/23  0317 03/12/23  0359   WBC 9.70* 8.55  8.60 9.07   RBC 5.37 5.06  5.25 5.24   Hgb 15.0 13.7  14.3 14.5   Hematocrit 42.2 40.5  41.5 41.6   MCV 78.6 80.0  79.0 79.4   Platelets 354* 326  349* 337       Recent Labs   Lab 03/14/23  0312 03/13/23  0317 03/12/23  0446 03/12/23  0359 03/11/23  0310   Sodium 140 141  142 141 147* 141   Potassium 4.1 4.0  4.0 4.1 1.9* 4.2   Chloride 111 111  112* 110 133* 106   CO2 23 24  23 22  10* 24   BUN 14.0 16.0  16.0 16.0 9.0 14.0   Creatinine 0.9 0.8  0.8 0.8 0.4* 1.1   Glucose 98 102*  101* 108* 54* 102*   Calcium 9.4 9.0  9.0 9.1 4.0* 9.6   Magnesium 2.1 2.1  --  1.8 2.3       Recent Labs   Lab  03/14/23  0312 03/13/23  0317 03/12/23  0359 03/11/23  0310 03/10/23  1732   ALT 43 41  --   --  65*   AST (SGOT) 19 16  --   --  28   Bilirubin, Total 0.3 0.3  --   --  0.6   Albumin 3.9 3.7  3.6 1.6*  More results in Results Review 4.2   Alkaline Phosphatase 74 72  --   --  72   More results in Results Review = values in this interval not displayed.       Recent Labs   Lab 03/11/23  0310 03/10/23  1732   hs Troponin-I <2.7 <2.7   hs Troponin-I Delta  --  Unable toCalc.       Recent Labs   Lab 03/11/23  0310 03/10/23  1429   PT INR 1.1 1.0   PT 12.6 12.1   PTT 34 34       Microbiology Results (last 15 days)       Procedure Component Value Units Date/Time    COVID-19 (SARS-CoV-2) and Influenza A/B, NAA (Liat Rapid)- Admission [[id number]] Collected: 03/11/23 0729    Order Status: Completed Specimen: Culturette from Nasopharyngeal Updated: 03/11/23 0814     Purpose of COVID testing Diagnostic -[facility]     SARS-CoV-2 Specimen Source Nasal Swab     SARS CoV 2 Overall Result Not Detected     Comment: __________________________________________________  -A result of "Detected" indicates POSITIVE for the    presence of SARS CoV-2 RNA  -A result of "Not Detected" indicates NEGATIVE for the    presence of SARS CoV-2 RNA  __________________________________________________________  Test performed using the Roche cobas Liat SARS-CoV-2  assay. This assay is  only for use under the Food and Drug Administration's Emergency Use  Authorization. This is a real-time RT-PCR assay for the qualitative  detection of SARS-CoV-2 RNA. Viral nucleic acids may persist in vivo,  independent of viability. Detection of viral nucleic acid does not imply the  presence of infectious virus, or that virus nucleic acid is the cause of  clinical symptoms. Negative results do not preclude SARS-CoV-2 infection and  should not be used as the sole basis for diagnosis, treatment or other  patient management decisions. Negative results must be combined  with  clinical observations, patient history, and/or epidemiological information.  Invalid results may be due to inhibiting substances in the specimen and  recollection should occur. Please see Fact Sheets for patients and providers  located:  WirelessDSLBlog.no          Influenza A Not Detected     Influenza B Not Detected     Comment: Test performed using the Roche cobas Liat SARS-CoV-2 & Influenza A/B assay.  This assay is only for use under the Food and Drug Administration's  Emergency Use Authorization. This is a multiplex real-time RT-PCR assay  intended for the simultaneous in vitro qualitative detection and  differentiation of SARS-CoV-2, influenza A, and influenza B virus RNA. Viral  nucleic acids may persist in vivo, independent of viability. Detection of  viral nucleic acid does not imply the presence of infectious virus, or that  virus nucleic acid is the cause of clinical symptoms. Negative results do  not preclude SARS-CoV-2, influenza A, and/or influenza B infection and  should not be used as the sole basis for diagnosis, treatment or other  patient management decisions. Negative results must be combined with  clinical observations, patient history, and/or epidemiological information.  Invalid results may be due to inhibiting substances in the specimen and  recollection should occur. Please see Fact Sheets for patients and providers  located: http://www.rice.biz/.         Narrative:      o Collect and clearly label specimen type:  o PREFERRED-Upper respiratory specimen: One Nasal Swab in  Transport Media.  o Hand deliver to laboratory ASAP  Diagnostic -[facility]             RADIOLOGY     CT Head WO Contrast    Result Date: 03/11/2023   Stable unremarkable appearance of the brain. No bleed or appreciable acute infarct. Trilby Drummer, MD 03/11/2023 2:13 PM    US Venous Low Extrem Duplx Dopp Comp Bilat    Result Date: 03/11/2023   No sonographic evidence for right or  left lower extremity deep venous thrombosis. Marty Heck, MD 03/11/2023 9:20 AM    MR Angiogram Head WO Contrast    Result Date: 03/10/2023  1.There is no stenosis of the proximal right internal carotid artery based on NASCET criteria.  2.There is no stenosis of the proximal left internal carotid artery based on NASCET criteria. 3.The vertebrobasilar arterial system is patent. 4.Normal intracranial MR angiogram. Terrilee Croak, MD 03/10/2023 10:10 PM    MR Angiogram Neck W WO Contrast    Result Date: 03/10/2023  1.There is no stenosis of the proximal right internal carotid artery based on NASCET criteria.  2.There is no stenosis of the proximal left internal carotid artery based on NASCET criteria. 3.The vertebrobasilar arterial system is patent. 4.Normal intracranial MR angiogram. Terrilee Croak, MD 03/10/2023 10:10 PM    MRI Brain W WO Contrast    Result Date: 03/10/2023  1.Small foci of T2 prolongation in the anterior left subinsular region also with minor gliosis related to a prior ischemic, infectious inflammatory process. 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 Terrilee Croak, MD 03/10/2023 10:05 PM    CT Head WO Contrast    Result Date: 03/10/2023   1.Normal examination. Terrilee Croak, MD 03/10/2023 6:58 PM    CT Angiogram Head Neck    Result Date: 03/10/2023  1.rCBF<30% images show 0 cc. Tmax>6sec images show 0 cc. 2.The intracranial arteries are patent, no proximal intracranial large vessel occlusion. 3.There is no stenosis of the proximal right internal carotid artery based on NASCET criteria. 4.There is no stenosis of the proximal left internal carotid artery based on NASCET criteria.  Alex Einar Pheasant, MD 03/10/2023 4:04 PM    CT Angiogram Cerebral Perfusion W 3D Reconstruction    Result Date: 03/10/2023  1.rCBF<30% images show 0 cc. Tmax>6sec images show 0 cc. 2.The intracranial arteries are patent, no proximal intracranial large vessel  occlusion. 3.There is no stenosis of the proximal right internal carotid artery based on NASCET criteria. 4.There is no stenosis of the proximal left internal carotid artery based on NASCET criteria.  Alex Einar Pheasant, MD 03/10/2023 4:04 PM    CT Head without Contrast    Result Date: 03/10/2023   No acute intracranial abnormality. Georgann Housekeeper, MD 03/10/2023 2:00 PM       Signed,  Ronie Spies, MD  5:56 PM 03/14/2023

## 2023-03-14 NOTE — Plan of Care (Signed)
NURSING SHIFT NOTE     Patient: Kurt Rogers  Day: 0      SHIFT EVENTS     Shift Narrative/Significant Events (PRN med administration, fall, RRT, etc.):     Patient is alert   and oriented x 4. Able to verbalize needs. Denies  pain,SOB at this time. On RA  . On tele monitor shows nsr rhythm. Continues on stroke protocol. MRI of cervical completed, results pending.     Safety and fall precautions remain in place. Purposeful rounding completed.          CARE PLAN       Problem: Compromised Sensory Perception  Goal: Sensory Perception Interventions  Outcome: Progressing  Flowsheets (Taken 03/14/2023 2000)  Sensory Perception Interventions: Offload heels, Pad bony prominences, Reposition q 2hrs/turn Clock, Q2 hour skin assessment under devices if present     Problem: Compromised Activity/Mobility  Goal: Activity/Mobility Interventions  Outcome: Progressing  Flowsheets (Taken 03/14/2023 2000)  Activity/Mobility Interventions: Pad bony prominences, TAP Seated positioning system when OOB, Promote PMP, Reposition q 2 hrs / turn clock, Offload heels     Problem: Compromised Nutrition  Goal: Nutrition Interventions  Outcome: Progressing  Flowsheets (Taken 03/14/2023 2214)  Nutrition Interventions: Discuss nutrition at Rounds, I&Os, Document % meal eaten, Daily weights     Problem: Every Day - Stroke  Goal: Core/Quality measure requirements - Daily  Outcome: Progressing  Flowsheets (Taken 03/14/2023 0950 by Cathi Roan, RN)  Core/Quality measure requirements - Daily:   VTE Prevention: Ensure anticoagulant(s) administered and/or anti-embolism stockings/devices documented by end of day 2   Ensure antithrombotic administered or contraindication documented by LIP by end of day 2   Once lipid panel has resulted, check LDL. Contact provider for statin order if LDL > 70 (or ensure contraindication documented by LIP).   Continue stroke education (must include Modifiable Risk Factors, Warning Signs and Symptoms of Stroke, Activation  of Emergency Medical System and Follow-up Appointments). Ensure handout has been given and documented.  Goal: Neurological status is stable or improving  Outcome: Progressing  Flowsheets (Taken 03/14/2023 2214)  Neurological status is stable or improving:   Monitor/assess/document neurological assessment (Stroke: every 4 hours)   Perform CAM Assessment   Re-assess NIH Stroke Scale for any change in status   Monitor/assess NIH Stroke Scale   Observe for seizure activity and initiate seizure precautions if indicated  Goal: Stable vital signs and fluid balance  Outcome: Progressing  Flowsheets (Taken 03/14/2023 2214)  Stable vital signs and fluid balance:   Position patient for maximum circulation/cardiac output   Monitor and assess vitals every 4 hours or as ordered and hemodynamic parameters   Monitor intake and output. Notify LIP if urine output is < 30 mL/hour.   Apply telemetry monitor as ordered   Encourage oral fluid intake  Goal: Patient will maintain adequate oxygenation  Outcome: Progressing  Flowsheets (Taken 03/14/2023 2214)  Patient will maintain adequate oxygenation: Maintain SpO2 of greater than 92%  Goal: Patient's risk of aspiration will be minimized  Outcome: Progressing  Flowsheets (Taken 03/14/2023 2214)  Patient's risk of aspiration will be minimized: Monitor/assess for signs of aspiration (tachypnea, cough, wheezing, clearing throat, hoarseness after eating, decrease in SaO2  Goal: Neurovascular status is stable or improving  Outcome: Progressing  Flowsheets (Taken 03/14/2023 2214)  Neurovascular status is stable or improving: Monitor/assess neurovascular status (pulses, capillary refill, pain, paresthesia, presence of edema)  Goal: Effective coping demonstrated  Outcome: Progressing  Flowsheets (Taken 03/14/2023 2214)  Effective coping  demonstrated: Assess/report to LIP uncontrolled anxiety, depression, or ineffective coping     Problem: Compromised Moisture  Goal: Moisture level  Interventions  Outcome: Progressing  Flowsheets (Taken 03/14/2023 2214)  Moisture level Interventions: Moisture wicking products, Moisture barrier cream

## 2023-03-15 ENCOUNTER — Inpatient Hospital Stay: Admit: 2023-03-15 | Payer: Self-pay

## 2023-03-15 LAB — MAGNESIUM: Magnesium: 2.2 mg/dL (ref 1.6–2.6)

## 2023-03-15 MED ORDER — GADOTERATE MEGLUMINE 10 MMOL/20ML IV SOLN (CLARISCAN)
20.0000 mL | Freq: Once | INTRAVENOUS | Status: AC | PRN
Start: 2023-03-15 — End: 2023-03-15
  Administered 2023-03-15: 20 mL via INTRAVENOUS

## 2023-03-15 NOTE — Progress Notes (Signed)
Progress Note    Date Time: 03/15/23 2:01 PM  Patient Name: Kurt Rogers  Attending Physician: Ronie Spies, MD      Assessment & Plan:   32 yo male with ADHD, anxiety, depression admitted on 4/24 with acute left sided weakness and numbness and tremors.  MRI brain and C-spine negative.    Pt feels LLE weakness worsening since being in the hospital.    Seems to have inconsistent strength exam LLE  Can easily raise LUE    -  pt denies there being any psychiatric component to his weakness.  -  negative work up(as has been the case with other facilities).  -  OK to [place]    Subjective:       Review of Systems:   No headache, eye, ear nose, throat problems; no coughing or wheezing or shortness of breath, No chest pain or orthopnea, no abdominal pain, nausea or vomiting, No pain in the body or extremities, no psychiatric, neurological, endocrine, hematological or cardiac complaints except as noted above.     Physical Exam:   Blood pressure 146/80, pulse 96, temperature 98.2 F (36.8 C), temperature source Oral, resp. rate 16, height 1.778 m (5\' 10" ), weight 108.9 kg (240 lb), SpO2 97%.    Alert  LUE easily raises up  LLE - not raising.  No extending L knee when initially asked.  After I extend it - keeps it extended for a second and then drops it.      Meds:      Scheduled Meds: PRN Meds:    amphetamine-dextroamphetamine, 10 mg, Oral, BID  busPIRone, 5 mg, Oral, BID  heparin (porcine), 5,000 Units, Subcutaneous, Q8H SCH  lithium, 300 mg, Oral, TID  senna-docusate, 1 tablet, Oral, QHS        Continuous Infusions:   niCARdipine      calcium GLUConate, 1 g, PRN  diphenhydrAMINE, 25 mg, Q6H PRN  hydrALAZINE, 10 mg, Q3H PRN  labetalol, 10 mg, Q15 Min PRN  magnesium oxide, 400-800 mg, PRN   Or  magnesium sulfate, 1 g, PRN  niCARdipine, 0-15 mg/hr, Continuous PRN  potassium chloride, 0-60 mEq, PRN   Or  potassium chloride, 0-60 mEq, PRN   Or  potassium chloride, 10 mEq, PRN  QUEtiapine, 25 mg, QHS PRN  sodium  phosphates 15 mmol in dextrose 5 % 250 mL IVPB, 15 mmol, PRN  sodium phosphates 25 mmol in dextrose 5 % 250 mL IVPB, 25 mmol, PRN  sodium phosphates 35 mmol in dextrose 5 % 250 mL IVPB, 35 mmol, PRN            I personally reviewed all of the medications    Labs:     Recent Labs   Lab 03/15/23  0320 03/14/23  0312 03/13/23  0317 03/12/23  0446 03/12/23  0359 03/11/23  0310 03/10/23  1732   Glucose  --  98 102*  101* 108* 54*  More results in Results Review 90   BUN  --  14.0 16.0  16.0 16.0 9.0  More results in Results Review 12.0   Creatinine  --  0.9 0.8  0.8 0.8 0.4*  More results in Results Review 0.9   Calcium  --  9.4 9.0  9.0 9.1 4.0*  More results in Results Review 9.6   Sodium  --  140 141  142 141 147*  More results in Results Review 139   Potassium  --  4.1 4.0  4.0 4.1 1.9*  More  results in Results Review 3.9   Chloride  --  111 111  112* 110 133*  More results in Results Review 106   CO2  --  23 24  23 22  10*  More results in Results Review 23   Albumin  --  3.9 3.7  3.6  --  1.6*  More results in Results Review 4.2   Phosphorus  --  5.4* 5.1*  5.0*  --  2.0*  More results in Results Review  --    Magnesium 2.2 2.1 2.1  --  1.8  More results in Results Review  --    AST (SGOT)  --  19 16  --   --   --  28   ALT  --  43 41  --   --   --  65*   Bilirubin, Total  --  0.3 0.3  --   --   --  0.6   Alkaline Phosphatase  --  74 72  --   --   --  72   More results in Results Review = values in this interval not displayed.     Recent Labs   Lab 03/14/23  0312 03/13/23  0317 03/12/23  0359   WBC 9.70* 8.55  8.60 9.07   Hgb 15.0 13.7  14.3 14.5   Hematocrit 42.2 40.5  41.5 41.6   MCV 78.6 80.0  79.0 79.4   MCH 27.9 27.1  27.2 27.7   MCHC 35.5 33.8  34.5 34.9   Platelets 354* 326  349* 337         No results for input(s): "PTT", "PT", "INR" in the last 72 hours.       Radiology Results (24 Hour)       Procedure Component Value Units Date/Time    MRI Cervical Spine W WO Contrast [[id number]]  Collected: 03/15/23 8546    Order Status: Completed Updated: 03/15/23 0837    Narrative:      HISTORY: Motor neuron disease. Left sided weakness.     COMPARISON: None available.    TECHNIQUE: MRI of the cervical spine performed on a 1.5 Tesla scanner  without and with intravenous contrast.     CONTRAST: gadoterate meglumine (CLARISCAN) 10 MMOL/20ML injection 20 mL    FINDINGS:   There is straightening of the normal cervical lordosis. The vertebral  body heights are maintained. The marrow signal is unremarkable. Both  vertebral arteries demonstrate normal flow-related signal loss.    The cervicomedullary junction is normal. No tonsillar ectopia is  present. The cervical cord is normal in caliber and signal intensity.  Individual disc levels are as follows:    C2-C3: Normal disc signal, height and morphology. No central canal or  foraminal narrowing. The facets are unremarkable.    C3-C4: Normal disc signal, height and morphology. No central canal or  foraminal narrowing. The facets are unremarkable.    C4-C5: Normal disc signal, height and morphology. No central canal or  foraminal narrowing. The facets are unremarkable.    C5-C6: Disc degeneration with mild disc space height loss and annular  bulging. No central stenosis. There is mild bilateral foraminal  encroachment. There is degenerative facet arthropathy.    C6-C7: Disc degeneration with annular bulging which indents the ventral  thecal sac. There is no central or foraminal stenosis.   C7-T1: Normal disc signal, height and morphology. No central canal or  foraminal narrowing. The facets are unremarkable.  Postcontrast imaging demonstrates no pathologic enhancement.  Impression:          1. No acute abnormality identified. The cervical spinal cord signal and  caliber is maintained.  2. Cervical spondylosis as described above.     Neldon Mc, MD  03/15/2023 8:35 AM             All recent brain and spine imaging (MRI, CT) results reviewed.    Code status  listed in chart confirmed    The notes from the last 24 hours were reviewed.     Case discussed with: patient and Dr. Lou Miner    35 minutes;  involving time spent examining patient, in counseling or coordination of care, reviewing test results, and in documentation.    Signed by: Ardelle Anton, MD  Spectralink: [phone]       Answering Service: [phone]

## 2023-03-15 NOTE — Progress Notes (Addendum)
[facility]  PROGRESS NOTE      Patient: Kurt Rogers  Date: 03/15/2023   LOS: 0 Days  Admission Date: 03/10/2023   MRN: [id number]  Attending: Ronie Spies, MD  Please page me using Epic SecureChat       ASSESSMENT/PLAN     Kurt Rogers is a 32 y.o. male admitted with Left-sided weakness.     Interval Summary:  Overview/Plan: 32 year old male who presented for left-sided weakness/tremor status post TNK.  Did have similar event 1 year ago.  No acute neurological findings on MRI that correlate with an acute stroke.  Unclear the etiology or if there is an organic cause.  Does have slight weakness in the left upper, and more pronounced in the left lower extremity.  PT OT recommending rehab.  Declined, currently working on discharge to SNF.    For Tomorrow:     Anticipated Discharge: 24 to 48 hours    Barriers to Discharge:     Family/Social/Case Mgr Assistance:       Left-sided weakness/tremor status post thrombolysis  MRI negative, stroke rule out, possible suspected conversion disorder  MRI unremarkable as well as MRA of head and neck unremarkable  Continue neurochecks every 4 hours  Lipid panel, hemoglobin A1c TSH unremarkable  Psych consulted for further evaluation -no change of medications recommended.  PT OT recommended acute rehab, pending authorization  -Reviewed case with Dr. Moise Boring today, reviewed today's CBC and BMP, MRI cervical spine with and without contrast  -Discussed with case management.  Called insurance company to initiate peer to peer.  Received phone call back and completed peer to peer.  Patient declined for inpatient rehab.  Will work on discharge to SNF.    Anxiety and depression  Continue home psych medications, BuSpar, Seroquel    Mood disorder unspecified possibly bipolar  Continue home medications lithium, lithium level 0.36.    History of ADHD  Continue home medication, Adderall    Nutrition:    Regular    DVT Prophylaxis: Heparin    Code Status: Full  Code    Chart Review and Discussion:   The following chart items were reviewed as of 9:05 PM on 03/15/23:  [x]  Lab Results [x]  Imaging Results   []  Problem List  [x]  Current Orders [x]  Current Medications  []  Allergies  []  Code Status []  Advanced Care Planning  []  SDoH        The management and plan of care for this patient was discussed with the following specialty consultants:  []  Cardiology  [] Gastroenterology                 []  Infectious Disease  []  Pulmonology [x]  Neurology                []  Nephrology  []  Neurosurgery []  Orthopedic Surgery  []  Heme/Onc  []  General Surgery []  Psychiatry                                   []  Palliative       SUBJECTIVE     No acute events overnight.  Seen on trio rounding.  Discussed with neurology today.  Reviewed MRI C-spine with and without contrast which was negative for any acute abnormality    MEDICATIONS     Current Facility-Administered Medications   Medication Dose Route Frequency    amphetamine-dextroamphetamine  10 mg Oral BID    busPIRone  5 mg Oral BID    heparin (porcine)  5,000 Units Subcutaneous Q8H SCH    lithium  300 mg Oral TID    senna-docusate  1 tablet Oral QHS       PHYSICAL EXAM     Vitals:    03/15/23 1904   BP: 129/85   Pulse: (!) 110   Resp: 18   Temp: 99 F (37.2 C)   SpO2: 95%       Temperature: Temp  Min: 97.7 F (36.5 C)  Max: 99 F (37.2 C)  Pulse: Pulse  Min: 74  Max: 110  Respiratory: Resp  Min: 16  Max: 18  Non-Invasive BP: BP  Min: 104/67  Max: 146/80  Pulse Oximetry SpO2  Min: 94 %  Max: 97 %    Intake and Output Summary (Last 24 hours) at Date Time    Intake/Output Summary (Last 24 hours) at 03/15/2023 2105  Last data filed at 03/15/2023 1206  Gross per 24 hour   Intake 1000 ml   Output --   Net 1000 ml       GEN APPEARANCE: Patient resting in bed in NAD.  HEENT: No scleral icterus, MMM  CVS: RRR, S1, S2; No M/G/R  LUNGS: CTAB; No Wheezes; No Rhonchi: No rales  ABD: Soft; No TTP; + Normoactive BS, no rebound or guarding  EXT: WWP, patient  did appear to respond to pain to nailbed on left lower extremity  Skin exam:  No gross lesions noted on exposed skin surfaces  MENTAL STATUS:  Answers questions appropriately, responds to commands        LABS     Recent Labs   Lab 03/14/23  0312 03/13/23  0317 03/12/23  0359   WBC 9.70* 8.55  8.60 9.07   RBC 5.37 5.06  5.25 5.24   Hgb 15.0 13.7  14.3 14.5   Hematocrit 42.2 40.5  41.5 41.6   MCV 78.6 80.0  79.0 79.4   Platelets 354* 326  349* 337       Recent Labs   Lab 03/15/23  0320 03/14/23  0312 03/13/23  0317 03/12/23  0446 03/12/23  0359 03/11/23  0310   Sodium  --  140 141  142 141 147* 141   Potassium  --  4.1 4.0  4.0 4.1 1.9* 4.2   Chloride  --  111 111  112* 110 133* 106   CO2  --  23 24  23 22  10* 24   BUN  --  14.0 16.0  16.0 16.0 9.0 14.0   Creatinine  --  0.9 0.8  0.8 0.8 0.4* 1.1   Glucose  --  98 102*  101* 108* 54* 102*   Calcium  --  9.4 9.0  9.0 9.1 4.0* 9.6   Magnesium 2.2 2.1 2.1  --  1.8 2.3       Recent Labs   Lab 03/14/23  0312 03/13/23  0317 03/12/23  0359 03/11/23  0310 03/10/23  1732   ALT 43 41  --   --  65*   AST (SGOT) 19 16  --   --  28   Bilirubin, Total 0.3 0.3  --   --  0.6   Albumin 3.9 3.7  3.6 1.6*  More results in Results Review 4.2   Alkaline Phosphatase 74 72  --   --  72   More results in Results Review = values in this interval not displayed.  Recent Labs   Lab 03/11/23  0310 03/10/23  1732   hs Troponin-I <2.7 <2.7   hs Troponin-I Delta  --  Unable toCalc.       Recent Labs   Lab 03/11/23  0310 03/10/23  1429   PT INR 1.1 1.0   PT 12.6 12.1   PTT 34 34       Microbiology Results (last 15 days)       Procedure Component Value Units Date/Time    COVID-19 (SARS-CoV-2) and Influenza A/B, NAA (Liat Rapid)- Admission [[id number]] Collected: 03/11/23 0729    Order Status: Completed Specimen: Culturette from Nasopharyngeal Updated: 03/11/23 0814     Purpose of COVID testing Diagnostic -PUI     SARS-CoV-2 Specimen Source Nasal Swab     SARS CoV 2 Overall Result  Not Detected     Comment: __________________________________________________  -A result of "Detected" indicates POSITIVE for the    presence of SARS CoV-2 RNA  -A result of "Not Detected" indicates NEGATIVE for the    presence of SARS CoV-2 RNA  __________________________________________________________  Test performed using the Roche cobas Liat SARS-CoV-2 assay. This assay is  only for use under the Food and Drug Administration's Emergency Use  Authorization. This is a real-time RT-PCR assay for the qualitative  detection of SARS-CoV-2 RNA. Viral nucleic acids may persist in vivo,  independent of viability. Detection of viral nucleic acid does not imply the  presence of infectious virus, or that virus nucleic acid is the cause of  clinical symptoms. Negative results do not preclude SARS-CoV-2 infection and  should not be used as the sole basis for diagnosis, treatment or other  patient management decisions. Negative results must be combined with  clinical observations, patient history, and/or epidemiological information.  Invalid results may be due to inhibiting substances in the specimen and  recollection should occur. Please see Fact Sheets for patients and providers  located:  WirelessDSLBlog.no          Influenza A Not Detected     Influenza B Not Detected     Comment: Test performed using the Roche cobas Liat SARS-CoV-2 & Influenza A/B assay.  This assay is only for use under the Food and Drug Administration's  Emergency Use Authorization. This is a multiplex real-time RT-PCR assay  intended for the simultaneous in vitro qualitative detection and  differentiation of SARS-CoV-2, influenza A, and influenza B virus RNA. Viral  nucleic acids may persist in vivo, independent of viability. Detection of  viral nucleic acid does not imply the presence of infectious virus, or that  virus nucleic acid is the cause of clinical symptoms. Negative results do  not preclude SARS-CoV-2, influenza  A, and/or influenza B infection and  should not be used as the sole basis for diagnosis, treatment or other  patient management decisions. Negative results must be combined with  clinical observations, patient history, and/or epidemiological information.  Invalid results may be due to inhibiting substances in the specimen and  recollection should occur. Please see Fact Sheets for patients and providers  located: http://www.rice.biz/.         Narrative:      o Collect and clearly label specimen type:  o PREFERRED-Upper respiratory specimen: One Nasal Swab in  Transport Media.  o Hand deliver to laboratory ASAP  Diagnostic -PUI             RADIOLOGY     MRI Cervical Spine W WO Contrast    Result Date: 03/15/2023  1. No acute abnormality identified. The cervical spinal cord signal and caliber is maintained. 2. Cervical spondylosis as described above. Neldon Mc, MD 03/15/2023 8:35 AM    CT Head WO Contrast    Result Date: 03/11/2023   Stable unremarkable appearance of the brain. No bleed or appreciable acute infarct. Trilby Drummer, MD 03/11/2023 2:13 PM    US Venous Low Extrem Duplx Dopp Comp Bilat    Result Date: 03/11/2023   No sonographic evidence for right or left lower extremity deep venous thrombosis. Marty Heck, MD 03/11/2023 9:20 AM    MR Angiogram Head WO Contrast    Result Date: 03/10/2023  1.There is no stenosis of the proximal right internal carotid artery based on NASCET criteria.  2.There is no stenosis of the proximal left internal carotid artery based on NASCET criteria. 3.The vertebrobasilar arterial system is patent. 4.Normal intracranial MR angiogram. Terrilee Croak, MD 03/10/2023 10:10 PM    MR Angiogram Neck W WO Contrast    Result Date: 03/10/2023  1.There is no stenosis of the proximal right internal carotid artery based on NASCET criteria.  2.There is no stenosis of the proximal left internal carotid artery based on NASCET criteria. 3.The vertebrobasilar arterial system is  patent. 4.Normal intracranial MR angiogram. Terrilee Croak, MD 03/10/2023 10:10 PM    MRI Brain W WO Contrast    Result Date: 03/10/2023   1.Small foci of T2 prolongation in the anterior left subinsular region also with minor gliosis related to a prior ischemic, infectious inflammatory process. 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 Terrilee Croak, MD 03/10/2023 10:05 PM    CT Head WO Contrast    Result Date: 03/10/2023   1.Normal examination. Terrilee Croak, MD 03/10/2023 6:58 PM    CT Angiogram Head Neck    Result Date: 03/10/2023  1.rCBF<30% images show 0 cc. Tmax>6sec images show 0 cc. 2.The intracranial arteries are patent, no proximal intracranial large vessel occlusion. 3.There is no stenosis of the proximal right internal carotid artery based on NASCET criteria. 4.There is no stenosis of the proximal left internal carotid artery based on NASCET criteria.  Alex Einar Pheasant, MD 03/10/2023 4:04 PM    CT Angiogram Cerebral Perfusion W 3D Reconstruction    Result Date: 03/10/2023  1.rCBF<30% images show 0 cc. Tmax>6sec images show 0 cc. 2.The intracranial arteries are patent, no proximal intracranial large vessel occlusion. 3.There is no stenosis of the proximal right internal carotid artery based on NASCET criteria. 4.There is no stenosis of the proximal left internal carotid artery based on NASCET criteria.  Alex Einar Pheasant, MD 03/10/2023 4:04 PM    CT Head without Contrast    Result Date: 03/10/2023   No acute intracranial abnormality. Georgann Housekeeper, MD 03/10/2023 2:00 PM       Signed,  Kurt Spies, MD  9:05 PM 03/15/2023

## 2023-03-15 NOTE — OT Progress Note (Signed)
[facility]   Occupational Therapy Attempt Note    Patient:  Kurt Rogers MRN#:  [id number]  Unit:  25 [facility]MED[facility] Room/Bed:  A2501/A2501-01      Occupational Therapy treatment attempted on 03/15/2023 at 5:13 PM.    OT Visit Cancellation Reason: Other (comment required) (eating)          Occupational therapy will follow up at a later time/date as able.    RN aware.     Gerhard Munch, COTA/L    Physical Medicine and Rehabilitation  [facility]  [phone]    03/15/2023  5:13 PM

## 2023-03-15 NOTE — Progress Notes (Addendum)
Called [phone] to schedule peer to peer, ref # [id number]. Scheduled peer to peer. Spoke with "Red." Scheduled for tomorrow b/t 8am-4pm, unable to give exact time, with Dr Barnett Abu medical director. Jovita Gamma callback # [phone]  Update: Received call back from Dr. Barnett Abu.  Discussed case.  Patient denied for inpatient rehab.  Acceptance for SNF.  Will work on [place] to SNF.

## 2023-03-15 NOTE — Nursing Progress Note (Signed)
4 eyes in 4 hours pressure injury assessment note:      Completed with: Sanjana, CT   Unit & Time admitted: Unit 23 2300             Bony Prominences: Check appropriate box; if wound is present enter wound assessment in LDA     Occiput:                 [x] WNL  []  Wound present  Face:                     [x] WNL  []  Wound present  Ears:                      [x] WNL  []  Wound present  Spine:                    [x] WNL  []  Wound present  Shoulders:             [x] WNL  []  Wound present  Elbows:                  [x] WNL  []  Wound present  Sacrum/coccyx:     [x] WNL  []  Wound present  Ischial Tuberosity:  [x] WNL  []  Wound present  Trochanter/Hip:      [x] WNL  []  Wound present  Knees:                   [x] WNL  []  Wound present  Ankles:                   [x] WNL  []  Wound present  Heels:                    [x] WNL  []  Wound present  Other pressure areas:  []  Wound location       Device related: []  Device name:         LDA completed if wound present: yes/no  Consult WOCN if necessary    Other skin related issues, ie tears, rash, etc, document in Integumentary flowsheet  Surgical Scar on Left shin. Scar the back of R arm.

## 2023-03-15 NOTE — Plan of Care (Signed)
NURSING SHIFT NOTE     Patient: Kurt Rogers  Day: 0      SHIFT EVENTS     Shift Narrative/Significant Events (PRN med administration, fall, RRT, etc.):     Pt alert and oriented X4 on room air. Trio rounding done with MD.Plan of care discussed with pt.  Safety and fall precautions remain in place. Purposeful rounding completed.          ASSESSMENT     Changes in assessment from patient's baseline this shift:    Neuro: No  CV: No  Pulm: No  Peripheral Vascular: No  HEENT: No  GI: No  BM during shift: No, Last BM: Last BM Date: 03/12/23  GU: No   Integ: No  MS: No  Pain: None  Pain Interventions: None  Medications Utilized:   Mobility: PMP Activity: Step 4 - Dangle at Bedside of Distance Walked (ft) (Step 6,7): 0 Feet           Lines     Patient Lines/Drains/Airways Status       Active Lines, Drains and Airways       Name Placement date Placement time Site Days    Peripheral IV 03/10/23 20 G Left Antecubital 03/10/23  1337  Antecubital  5    Peripheral IV 03/10/23 18 G Diffusion Anterior;Distal;Right Antecubital 03/10/23  1355  Antecubital  4                         VITAL SIGNS     Vitals:    03/15/23 1206   BP: 146/80   Pulse: 96   Resp: 16   Temp: 98.2 F (36.8 C)   SpO2: 97%       Temp  Min: 97.7 F (36.5 C)  Max: 99.1 F (37.3 C)  Pulse  Min: 74  Max: 96  Resp  Min: 16  Max: 18  BP  Min: 104/67  Max: 146/80  SpO2  Min: 95 %  Max: 97 %      Intake/Output Summary (Last 24 hours) at 03/15/2023 1344  Last data filed at 03/15/2023 1206  Gross per 24 hour   Intake 1000 ml   Output --   Net 1000 ml          CARE PLAN       Problem: Every Day - Stroke  Goal: Core/Quality measure requirements - Daily  Outcome: Progressing  Flowsheets (Taken 03/15/2023 1342)  Core/Quality measure requirements - Daily:   VTE Prevention: Ensure anticoagulant(s) administered and/or anti-embolism stockings/devices documented by end of day 2   Ensure antithrombotic administered or contraindication documented by LIP by end of day 2    Once lipid panel has resulted, check LDL. Contact provider for statin order if LDL > 70 (or ensure contraindication documented by LIP).   Continue stroke education (must include Modifiable Risk Factors, Warning Signs and Symptoms of Stroke, Activation of Emergency Medical System and Follow-up Appointments). Ensure handout has been given and documented.     Problem: Every Day - Stroke  Goal: Neurological status is stable or improving  Outcome: Progressing  Flowsheets (Taken 03/15/2023 1342)  Neurological status is stable or improving:   Monitor/assess/document neurological assessment (Stroke: every 4 hours)   Re-assess NIH Stroke Scale for any change in status   Perform CAM Assessment   Monitor/assess NIH Stroke Scale   Observe for seizure activity and initiate seizure precautions if indicated     Problem: Safety  Goal: Patient will be free from injury during hospitalization  Outcome: Progressing  Flowsheets (Taken 03/15/2023 1342)  Patient will be free from injury during hospitalization:   Assess patient's risk for falls and implement fall prevention plan of care per policy   Use appropriate transfer methods   Include patient/ family/ care giver in decisions related to safety   Ensure appropriate safety devices are available at the bedside   Provide and maintain safe environment   Hourly rounding     Problem: Safety  Goal: Patient will be free from infection during hospitalization  Outcome: Progressing  Flowsheets (Taken 03/15/2023 1342)  Free from Infection during hospitalization:   Assess and monitor for signs and symptoms of infection   Monitor all insertion sites (i.e. indwelling lines, tubes, urinary catheters, and drains)   Encourage patient and family to use good hand hygiene technique   Monitor lab/diagnostic results     Problem: Every Day - Stroke  Goal: Stable vital signs and fluid balance  Flowsheets (Taken 03/15/2023 1342)  Stable vital signs and fluid balance:   Monitor intake and output. Notify LIP if  urine output is < 30 mL/hour.   Position patient for maximum circulation/cardiac output   Monitor and assess vitals every 4 hours or as ordered and hemodynamic parameters   Encourage oral fluid intake   Apply telemetry monitor as ordered

## 2023-03-15 NOTE — Progress Notes (Signed)
LOS # 0      Summary of Discharge Plan:  [facility]    Identified Possible Discharge Barriers:  Auth denied, peer-to-peer offered    CM Interventions and Outcome:  CM messaged Talya, [facility] liaison for auth status update and confirmation of bed availability.  CM received notification auth denied, peer-to-peer offered.  CM messaged doc to initiate peer-to-peer. MD to call [phone] to schedule, ref # [id number]. Peer to peer is available until 03/17/23.     Discussed above Discharge Plan with (patient, family, Care Team, others):  Care Team    Case Management will continue to following on patient's discharge needs.

## 2023-03-15 NOTE — Progress Notes (Signed)
[facility] Note:    [employer] denied request for acute inpatient rehabilitation.   Peer to peer option is available if treatment team disagree's with [employer] determination.   To initiate peer to peer please call [phone] to schedule, ref # [id number].   Peer to peer is available until 03/17/23.      Carnesha Maravilla R Quantavis Obryant, BSN, Charity fundraiser, [employer], [place]  Rehab Admissions Liaison/Insurance [employer] [facility]   [facility] and [facility]

## 2023-03-15 NOTE — PT Progress Note (Signed)
Physical Therapy Note    Physical Therapy Treatment  Kurt Rogers  Post Acute Care Therapy Recommendations   Discharge Recommendations:  Acute Rehab    If recommended discharge disposition is not available, patient will require the following assistance: SNF    DME needs IF patient is discharging home: Front wheel walker    Therapy discharge recommendations may change with patient status.  Please refer to most recent note for up-to-date recommendations.    Unit: [facility]  Bed: A2501/A2501-01    ___________________________________________________    Time of treatment:  PT Received On: 03/15/23  Start Time: 0717  Stop Time: 0747  Time Calculation (min): 30 min       Chart Review and Collaboration with Care Team: 5 minutes, not included in above time.    PT Visit Number: 2    ___________________________________________________    Precautions:   Precautions  Weight Bearing Status: no restrictions  Fall Risks: High, Impaired balance/gait, Impaired mobility, Muscle weakness  Other Precautions: Aspiration precautions,  Bleeding precautions,  Fall precautions,  Seizure precautions,  Skin and pressure ulcer precautions (SBP 120-180)    Personal Protective Equipment (PPE)  gloves    Updated X-Rays/Tests/Labs:  Lab Results   Component Value Date/Time    HGB 15.0 03/14/2023 03:12 AM    HCT 42.2 03/14/2023 03:12 AM    K 4.1 03/14/2023 03:12 AM    NA 140 03/14/2023 03:12 AM    INR 1.1 03/11/2023 03:10 AM    TROPI <2.7 03/11/2023 03:10 AM    TROPI <2.7 03/10/2023 05:32 PM    TROPI Unable toCalc. 03/10/2023 05:32 PM    TROPI <2.7 03/10/2023 02:29 PM    TROPI <2.7 02/19/2023 03:09 AM       All imaging reviewed, please see chart for details.      Subjective: Patient reports weakness focus distal to the left lower extremity mainly his ankle.        Pain Assessment  Pain Assessment: No/denies pain        Patient's medical condition is appropriate for Physical Therapy intervention at this time.  Patient is  agreeable to participation in the therapy session. Nursing clears patient for therapy.      Objective:    PTA has reviewed chart and PT Eval for POC prior to treatment.       Observation of Patient/Vital Signs: Blood pressure 104/67, pulse 85, temperature 98.1 F (36.7 C), temperature source Oral, resp. rate 18, height 1.778 m (5\' 10" ), weight 108.9 kg (240 lb), SpO2 95%.       Cognition/Neuro Status  Arousal/Alertness: Appropriate responses to stimuli  Attention Span: Appears intact  Orientation Level: Oriented X4  Memory: Appears intact  Following Commands: Follows all commands and directions without difficulty  Safety Awareness: independent  Insights: Educated in Engineer, building services;Fully aware of deficits  Behavior: calm;attentive;cooperative         Functional Mobility  Supine to Sit: Contact Guard Assist;Increased Time;Increased Effort;to Right;HOB raised;using bedrail  Scooting to EOB: Stand by Assist;Increased Time;Increased Effort;to Left;to Right  Sit to Supine: Contact Guard Assist;to Left  Sit to Stand: Minimal Assist;Increased Effort;Increased Time;with instruction for hand placement to increase safety;bed elevated  Stand to Sit: Contact Guard Assist (instruction for eccentric control)        Distance Walked (ft) (Step 6,7): 0 Feet    Therapeutic Exercise  Quad Sets: x10 B  LLE flex<>ext x10 AAROM   Glute Sets: x10  Hook lying hip adduction isox 10  Hook lying lower trunk rotation x 10  Hip Abduction: x5 LLE AAROM     Neuro Re-Ed  Sitting Balance: with instruction;with support;stand by assist (EOB)  Standing Balance: with instruction;with support;minimal assist;contact guard assist (FWW)      Educated the Patient to role of physical therapy, plan of care, goals of therapy and HEP, safety with mobility and ADLs with verbalized understanding .    Patient left in bed with alarm and all other medical equipment in place and call bell and all personal items/needs within reach.  RN notified of session outcome.         Assessment:      Pt able to progress supine>sit w/o assistance, brief standing ~39min w/shuffling gait EOB ~26ft w/FWW. Pt observed w/slight inconsistent use of LLE (appears -2/5 felx<>ext in supine), while in standing pt performs ~5sec L SLS w/BUE support, c/o unable to perform on RLE 2/2 LLE weakness/dec hip flexion. Patient would continue to benefit from skilled physical therapy to address deficits and increase functional independence.           PMP Activity: Step 4 - Dangle at Bedside  Distance Walked (ft) (Step 6,7): 0 Feet    Plan:  Treatment/Interventions: Exercise, Gait training, Stair training, Neuromuscular re-education, Functional transfer training, LE strengthening/ROM, Endurance training, Patient/family training, Equipment eval/education, Bed mobility, Compensatory technique education      PT Frequency: 3-4x/wk   Continue plan of care.    Goals:  Goals  Goal Formulation: With patient  Time for Goal Acheivement: By time of discharge  Goals: Select goal  Pt Will Go Supine To Sit: with stand by assist, to maximize functional mobility and independence, Partly met  Pt Will Transfer Bed/Chair: with rolling walker, with stand by assist, to maximize functional mobility and independence  Pt Will Ambulate: 51-100 feet, with rolling walker, with stand by assist, to maximize functional mobility and independence  Pt Will Go Up / Down Stairs: 1 flight, with minimal assist, With rail, to maximize functional mobility and independence  Pt Will Perform Home Exer Program: independent, to maximize functional mobility and independence      Darrol Jump  [place] Lic# [id number]    Physical Medicine and Rehabilitation  [facility]  [phone]      03/15/2023 9:05 AM       [facility]  Patient: Kurt Rogers MRN#: [id number]  Unit: [facility] Bed: A2501/A2501-01

## 2023-03-15 NOTE — Plan of Care (Signed)
Problem:   NURSING SHIFT NOTE     Patient: Kurt Rogers  Day: 0      SHIFT EVENTS     Shift Narrative/Significant Events (PRN med administration, fall, RRT, etc.):     Patient is alert  and oriented x 4. Able to verbalize needs. Denies  pain,SOB at this time. On RA  . On tele monitor shows NSR rhythm.Stroke protocol discontinued.  2300 hrs, patient transferred to room 2306 with all his belongings.  Safety and fall precautions remain in place. Purposeful rounding completed.          ASSESSMENT     Changes in assessment from patient's baseline this shift:    Neuro: No  CV: No  Pulm: No  Peripheral Vascular: No  HEENT: No  GI: No  BM during shift: No, Last BM: Last BM Date: 03/15/23  GU: No   Integ: No  MS: No    Pain: None  Pain Interventions: Medications and Rest  Medications Utilized:     Mobility: PMP Activity: Step 3 - Bed Mobility of Distance Walked (ft) (Step 6,7): 0 Feet           Lines     Patient Lines/Drains/Airways Status       Active Lines, Drains and Airways       Name Placement date Placement time Site Days    Peripheral IV 03/10/23 20 G Left Antecubital 03/10/23  1337  Antecubital  5    Peripheral IV 03/10/23 18 G Diffusion Anterior;Distal;Right Antecubital 03/10/23  1355  Antecubital  5                         VITAL SIGNS     Vitals:    03/15/23 2000   BP: 130/86   Pulse:    Resp: 16   Temp: 98.7 F (37.1 C)   SpO2:        Temp  Min: 97.7 F (36.5 C)  Max: 99 F (37.2 C)  Pulse  Min: 74  Max: 110  Resp  Min: 16  Max: 18  BP  Min: 104/67  Max: 146/80  SpO2  Min: 94 %  Max: 97 %      Intake/Output Summary (Last 24 hours) at 03/15/2023 2211  Last data filed at 03/15/2023 1206  Gross per 24 hour   Intake 1000 ml   Output --   Net 1000 ml            CARE PLAN     Moderate/High Fall Risk Score >5  Goal: Patient will remain free of falls  Outcome: Progressing  Flowsheets (Taken 03/15/2023 2000)  Moderate Risk (6-13): MOD-Floor mat at bedside (where available) if appropriate     Problem: Compromised  Sensory Perception  Goal: Sensory Perception Interventions  Outcome: Progressing  Flowsheets (Taken 03/15/2023 1952)  Sensory Perception Interventions: Offload heels, Pad bony prominences, Reposition q 2hrs/turn Clock, Q2 hour skin assessment under devices if present     Problem: Compromised Activity/Mobility  Goal: Activity/Mobility Interventions  Outcome: Progressing  Flowsheets (Taken 03/15/2023 1952)  Activity/Mobility Interventions: Pad bony prominences, TAP Seated positioning system when OOB, Promote PMP, Reposition q 2 hrs / turn clock, Offload heels     Problem: Every Day - Stroke  Goal: Core/Quality measure requirements - Daily  Outcome: Progressing  Flowsheets (Taken 03/15/2023 2206)  Core/Quality measure requirements - Daily: VTE Prevention: Ensure anticoagulant(s) administered and/or anti-embolism stockings/devices documented by end of day 2  Goal: Neurological status is stable or improving  Outcome: Progressing  Flowsheets (Taken 03/15/2023 2206)  Neurological status is stable or improving: Perform CAM Assessment  Goal: Stable vital signs and fluid balance  Outcome: Progressing  Flowsheets (Taken 03/15/2023 2206)  Stable vital signs and fluid balance:   Position patient for maximum circulation/cardiac output   Apply telemetry monitor as ordered   Encourage oral fluid intake     Problem: Safety  Goal: Patient will be free from injury during hospitalization  Outcome: Progressing  Flowsheets (Taken 03/15/2023 2206)  Patient will be free from injury during hospitalization:   Assess patient's risk for falls and implement fall prevention plan of care per policy   Use appropriate transfer methods   Provide and maintain safe environment   Hourly rounding  Goal: Patient will be free from infection during hospitalization  Outcome: Progressing  Flowsheets (Taken 03/15/2023 2206)  Free from Infection during hospitalization: Assess and monitor for signs and symptoms of infection     Problem: Compromised Moisture  Goal:  Moisture level Interventions  Outcome: Progressing  Flowsheets (Taken 03/15/2023 2206)  Moisture level Interventions: Moisture wicking products, Moisture barrier cream     Problem: Compromised Friction/Shear  Goal: Friction and Shear Interventions  Outcome: Progressing  Flowsheets (Taken 03/15/2023 2206)  Friction and Shear Interventions: Pad bony prominences, Off load heels, HOB 30 degrees or less unless contraindicated, Consider: TAP seated positioning, Heel foams

## 2023-03-16 LAB — MAGNESIUM: Magnesium: 2.2 mg/dL (ref 1.6–2.6)

## 2023-03-16 NOTE — Plan of Care (Signed)
Patient: Kurt Rogers  Day: 0      SHIFT EVENTS     Shift Narrative/Significant Events (PRN med administration, fall, RRT, etc.):   Patient is alert  and oriented x 4. On room air, scheduled medication administered, pt tolerated well. On Neuro check assessment, Left sided weakness and unsteady gait noted. Pt is one person assist with a walker. Pt denied pain and SOB, updated on plan of care, call bell within reach.  Safety and fall precautions remain in place. Purposeful rounding completed.        Problem: Moderate/High Fall Risk Score >5  Goal: Patient will remain free of falls  Flowsheets (Taken 03/16/2023 1131)  Moderate Risk (6-13):   MOD-Floor mat at bedside (where available) if appropriate   MOD-Consider activation of bed alarm if appropriate   MOD-Remain with patient during toileting   MOD-Perform dangle, stand, walk (DSW) prior to mobilization   MOD-Use gait belt when appropriate   MOD-Consider a move closer to Nurses Station   MOD-Place bedside commode and assistive devices out of sight when not in use   MOD-Request PT/OT consult order for patients with gait/mobility impairment     Problem: Compromised Sensory Perception  Goal: Sensory Perception Interventions  Flowsheets (Taken 03/16/2023 1020)  Sensory Perception Interventions: Offload heels, Pad bony prominences, Reposition q 2hrs/turn Clock, Q2 hour skin assessment under devices if present     Problem: Compromised Activity/Mobility  Goal: Activity/Mobility Interventions  Flowsheets (Taken 03/16/2023 1020)  Activity/Mobility Interventions: Pad bony prominences, TAP Seated positioning system when OOB, Promote PMP, Reposition q 2 hrs / turn clock, Offload heels     Problem: Compromised Nutrition  Goal: Nutrition Interventions  Flowsheets (Taken 03/16/2023 1020)  Nutrition Interventions: Discuss nutrition at Rounds, I&Os, Document % meal eaten, Daily weights     Problem: Compromised Moisture  Goal: Moisture level Interventions  Flowsheets (Taken  03/16/2023 1020)  Moisture level Interventions: Moisture wicking products, Moisture barrier cream     Problem: Compromised Friction/Shear  Goal: Friction and Shear Interventions  Flowsheets (Taken 03/16/2023 1020)  Friction and Shear Interventions: Pad bony prominences, Off load heels, HOB 30 degrees or less unless contraindicated, Consider: TAP seated positioning, Heel foams     Problem: Every Day - Stroke  Goal: Stable vital signs and fluid balance  Flowsheets (Taken 03/16/2023 1131)  Stable vital signs and fluid balance:   Encourage oral fluid intake   Position patient for maximum circulation/cardiac output     Problem: Pain interferes with ability to perform ADL  Goal: Pain at adequate level as identified by patient  Flowsheets (Taken 03/16/2023 1131)  Pain at adequate level as identified by patient:   Identify patient comfort function goal   Assess for risk of opioid induced respiratory depression, including snoring/sleep apnea. Alert healthcare team of risk factors identified.   Reassess pain within 30-60 minutes of any procedure/intervention, per Pain Assessment, Intervention, Reassessment (AIR) Cycle   Evaluate if patient comfort function goal is met   Offer non-pharmacological pain management interventions     Problem: Safety  Goal: Patient will be free from injury during hospitalization  Flowsheets (Taken 03/16/2023 1131)  Patient will be free from injury during hospitalization:   Assess patient's risk for falls and implement fall prevention plan of care per policy   Provide and maintain safe environment   Use appropriate transfer methods   Include patient/ family/ care giver in decisions related to safety   Hourly rounding   Ensure appropriate safety devices are available at  the bedside  Goal: Patient will be free from infection during hospitalization  Flowsheets (Taken 03/16/2023 1131)  Free from Infection during hospitalization:   Assess and monitor for signs and symptoms of infection   Monitor all insertion  sites (i.e. indwelling lines, tubes, urinary catheters, and drains)   Encourage patient and family to use good hand hygiene technique     NURSING SHIFT NOTE         ASSESSMENT     Changes in assessment from patient's baseline this shift:    Neuro:   CV:   Pulm:   Peripheral Vascular:   HEENT:   GI:   BM during shift: , Last BM: Last BM Date: 03/16/23  GU:    Integ:   MS:     Pain:   Pain Interventions:   Medications Utilized:     Mobility: PMP Activity: Step 4 - Dangle at Bedside of Distance Walked (ft) (Step 6,7): 0 Feet           Lines     Patient Lines/Drains/Airways Status       Active Lines, Drains and Airways       Name Placement date Placement time Site Days    Peripheral IV 03/10/23 20 G Left Antecubital 03/10/23  1337  Antecubital  5    Peripheral IV 03/10/23 18 G Diffusion Anterior;Distal;Right Antecubital 03/10/23  1355  Antecubital  5                         VITAL SIGNS

## 2023-03-16 NOTE — Plan of Care (Signed)
NURSING SHIFT NOTE     Patient: Kurt Rogers  Day: 0      SHIFT EVENTS     Shift Narrative/Significant Events (PRN med administration, fall, RRT, etc.):     - Pt is A&Ox4, on RA. Pt transferred to unit at 2300.  - Pt ambulates with a walker, one person assist.  - Encouraged pt to call for assistance as needed.   - Awaiting SNF placement    Safety and fall precautions remain in place. Purposeful rounding completed.          ASSESSMENT     Changes in assessment from patient's baseline this shift:    Neuro: No  CV: No  Pulm: No  Peripheral Vascular: No  HEENT: No  GI: No  BM during shift: No, Last BM Date: 03/15/23  GU: No   Integ: No  MS: No    Pain: None  Pain Interventions: None  Medications Utilized: None    Mobility: PMP Activity: Step 3 - Bed Mobility of Distance Walked (ft) (Step 6,7): 0 Feet           Lines     Patient Lines/Drains/Airways Status       Active Lines, Drains and Airways       Name Placement date Placement time Site Days    Peripheral IV 03/10/23 20 G Left Antecubital 03/10/23  1337  Antecubital  5    Peripheral IV 03/10/23 18 G Diffusion Anterior;Distal;Right Antecubital 03/10/23  1355  Antecubital  5                         VITAL SIGNS     Vitals:    03/16/23 0340   BP: 98/65   Pulse: 85   Resp: 18   Temp: 97.9 F (36.6 C)   SpO2: 97%       Temp  Min: 97.7 F (36.5 C)  Max: 99 F (37.2 C)  Pulse  Min: 74  Max: 110  Resp  Min: 15  Max: 18  BP  Min: 98/65  Max: 146/80  SpO2  Min: 94 %  Max: 97 %      Intake/Output Summary (Last 24 hours) at 03/16/2023 0344  Last data filed at 03/15/2023 2300  Gross per 24 hour   Intake 1200 ml   Output --   Net 1200 ml          CARE PLAN        Problem: Moderate/High Fall Risk Score >5  Goal: Patient will remain free of falls  Flowsheets (Taken 03/16/2023 0343)  High (Greater than 13):   MOD-Remain with patient during toileting   MOD-Place Fall Risk level on whiteboard in room     Problem: Safety  Goal: Patient will be free from injury during  hospitalization  Flowsheets (Taken 03/16/2023 0343)  Patient will be free from injury during hospitalization:   Assess patient's risk for falls and implement fall prevention plan of care per policy   Provide and maintain safe environment   Use appropriate transfer methods   Ensure appropriate safety devices are available at the bedside   Include patient/ family/ care giver in decisions related to safety   Hourly rounding   Assess for patients risk for elopement and implement Elopement Risk Plan per policy  Goal: Patient will be free from infection during hospitalization  Flowsheets (Taken 03/15/2023 2206 by Massie Maroon, RN)  Free from Infection during hospitalization: Assess and monitor  for signs and symptoms of infection     Problem: Psychosocial and Spiritual Needs  Goal: Demonstrates ability to cope with hospitalization/illness  Flowsheets (Taken 03/14/2023 0950 by Cathi Roan, RN)  Demonstrates ability to cope with hospitalizations/illness:   Encourage verbalization of feelings/concerns/expectations   Communicate referral to spiritual care as appropriate   Encourage participation in diversional activity   Encourage patient to set small goals for self   Reinforce positive adaptation of new coping behaviors   Provide quiet environment   Assist patient to identify own strengths and abilities

## 2023-03-16 NOTE — Progress Notes (Signed)
Occupational Therapy Treatment  Kurt Rogers        Post Acute Care Therapy Recommendations   Discharge Recommendations:  Acute Rehab    If recommended discharge disposition is not available, patient will require the following assistance: SNF    DME needs IF patient is discharging home: Shower chair, Dressing stick (defer to PT for mobility AD)    Therapy discharge recommendations may change with patient status.  Please refer to most recent note for up-to-date recommendations.    Unit: [facility]  Bed: A2306/A2306.B    ___________________________________________________    Time of treatment:  OT Received On: 03/16/23  Start Time: 1329  Stop Time: 1429  Time Calculation (min): 60 min  Total Treatment Time (min): 58    Chart Review and Collaboration with Care Team: 15 minutes, not included in above time.    OT Visit Number: 2      Precautions and Contraindications:    Precautions  Weight Bearing Status: no restrictions  Fall Risks: High;History of fall(s);Impaired balance/gait;Impaired mobility;Muscle weakness  Other Precautions: Aspiration precautions,  Bleeding precautions,  Fall precautions,  Seizure precautions,  Skin and pressure ulcer precautions    Personal Protective Equipment (PPE)  gloves and procedure mask    Updated Labs:  Lab Results   Component Value Date/Time    HGB 15.0 03/14/2023 03:12 AM    HCT 42.2 03/14/2023 03:12 AM    K 4.1 03/14/2023 03:12 AM    NA 140 03/14/2023 03:12 AM    INR 1.1 03/11/2023 03:10 AM    TROPI <2.7 03/11/2023 03:10 AM    TROPI <2.7 03/10/2023 05:32 PM    TROPI Unable toCalc. 03/10/2023 05:32 PM    TROPI <2.7 03/10/2023 02:29 PM    TROPI <2.7 02/19/2023 03:09 AM       All imaging reviewed, please see chart for details.    Subjective:     Patient Goal: "I want to be able to walk without the walker. I don't want to go to work with a walker."    Patient's medical condition is appropriate for Occupational Therapy intervention at this time.  Patient is  agreeable to participation in the therapy session. Nursing clears patient for therapy.  Pain Assessment  Pain Assessment: No/denies pain      Objective:  Observation of Patient/Vital Signs:    In bed   127/82  EOB   125/77  After standing 128/79      117/79     113/72  After seated break 123/80    Cognition/Neuro Status  Arousal/Alertness: Appropriate responses to stimuli  Attention Span: Appears intact  Orientation Level: Oriented X4  Memory: Appears intact  Following Commands: Follows all commands and directions without difficulty  Safety Awareness: independent  Insights: Educated in Engineer, building services;Fully aware of deficits  Behavior: attentive;calm;cooperative  Motor Planning: decreased processing speed  Coordination: FMC impaired (LUE)  Hand Dominance: right handed    Functional Mobility  Supine to Sit Transfers: Stand by Assist;additional time (HOB flat, cues for technique.)  Sit to Supine Transfers: Stand by Assist  Sit to Stand Transfers: Stand by Assist;additional time (LUE on walker.)  Stand to Sit Transfers: Contact Guard Assist;additional time (poor eccentric lowering.)  Bed to Chair Transfers: Minimal Assist (Min A for FWW management during amb txf to recliner.)  Functional Mobility/Ambulation:  (3x STS trials with focus on eccentric lowering use of BUE)    Self-care and Home Management  Grooming: Stand by Assist;edge of bed;increased time  to complete;teeth care (seated)  LB Dressing:  (SBA to adjust socks while seated EOB)         Neuro Re-Ed  Balance Training: Standing reaching activities (1x 5 reps LUE reaching (D1/D2 diagonals) with CGA. 1 rep reaching with RUE.)    Educated the Patient to role of occupational therapy, plan of care, goals of therapy and safety with mobility and ADLs, energy conservation techniques, pursed lip breathing with verbalized understanding .     Patient left in bed with alarm and all other medical equipment in place and call bell and all personal items/needs within reach.   RN notified of session outcome.  Notified CM team and attending of d/c recommendation via secure chat.      Assessment:    Pt seen for f/u OT tx to address ADL performance, functional mobility, activity tolerance, standing balance.     Facilitated ADLs and functional mobility as noted above. Pt demo dec activity tolerance, requiring intermittent seated rest breaks. Pt reported feeling concerned about ability to stand without BUE support and completing ADLs in standing. Pt progressing to static standing with BUE support to standing/reaching with LUE. Pt continuing to have difficulty with reaching with RUE, d/t being unsteady with LUE support. ADLs completed completed from seated position to prompt LUE incorporation and BUE coordination. Pt continues to demo dec LUE strength (worse distally).     Pt will continue to benefit from acute OT services to maximize Ind and safety with ADLs and functional mobility and dec caregiver burden.     Patient anticipated to benefit from and tolerate 3 hours/day, 5 days/week.      PMP Activity: Step 6 - Walks in Room  Distance Walked (ft) (Step 6,7): 20 Feet (with Min A with FWW)      Plan:  OT Frequency Recommended: 3-4x/wk  Goal Formulation: Patient      Time For Goal Achievement: by time of discharge  ADL Goals  Patient will groom self: Supervision, at sinkside, Partly met  Patient will dress lower body: Supervision, Partly met  Mobility and Transfer Goals  Pt will transfer bed to toilet: Stand by Assist, with rolling walker, Partly met       Continue plan of care.      Jenkins Rouge, OTD, OTR/L  [place]   Physical Medicine and Rehabilitation   [facility]   [place] [facility]     03/16/2023 3:19 PM    [facility]  Patient: Kurt Rogers MRN#: [id number]   Unit: [facility] [address] [facility] Bed: [id number]/[id number].B

## 2023-03-16 NOTE — Progress Notes (Addendum)
[facility]  PROGRESS NOTE      Patient: Kurt Rogers  Date: 03/16/2023   LOS: 0 Days  Admission Date: 03/10/2023   MRN: [id number]  Attending: Pilar Plate, NP  Please page me using Epic SecureChat       ASSESSMENT/PLAN     Travez Stancil is a 32 y.o. male admitted with Left-sided weakness.     Interval Summary:  Overview/Plan: 32 year old male who presented for left-sided weakness/tremor status post TNK.  Did have similar event 1 year ago.  No acute neurological findings on MRI that correlate with an acute stroke.  Unclear the etiology or if there is an organic cause.  Does have slight weakness in the left upper, and more pronounced in the left lower extremity.  PT OT recommending rehab.  Declined, currently working on discharge to SNF.    For Tomorrow: F/U with Case management regarding d/c to SNF    Anticipated Discharge: 24 to 48 hours    Barriers to Discharge: Insurance denied acute rehab, application submitted for SNF    Family/Social/Case Mgr Assistance:       Left-sided weakness/tremor status post thrombolysis  MRI negative, stroke rule out, possible suspected conversion disorder  MRI unremarkable as well as MRA of head and neck unremarkable  Continue neurochecks every 4 hours  Lipid panel, hemoglobin A1c TSH unremarkable  Psych consulted for further evaluation -no change of medications recommended.  PT OT recommended acute rehab, pending authorization  -Reviewed case with Dr. Moise Boring today, reviewed today's CBC and BMP, MRI cervical spine with and without contrast  -Discussed with case management.  Called insurance company to initiate peer to peer.  Received phone call back and completed peer to peer. Patient declined for inpatient rehab.  Will work on discharge to SNF.    Anxiety and depression  Continue home psych medications, BuSpar, Seroquel    Mood disorder unspecified possibly bipolar  Continue home medications lithium, lithium level 0.36.    History of ADHD  Continue home  medication, Adderall    Nutrition:    Regular    DVT Prophylaxis: Heparin    Code Status: Full Code    Chart Review and Discussion:   The following chart items were reviewed as of 3:59 PM on 03/16/23:  [x]  Lab Results [x]  Imaging Results   []  Problem List  [x]  Current Orders [x]  Current Medications  []  Allergies  []  Code Status []  Advanced Care Planning  []  SDoH        The management and plan of care for this patient was discussed with the following specialty consultants:  []  Cardiology  [] Gastroenterology                 []  Infectious Disease  []  Pulmonology [x]  Neurology                []  Nephrology  []  Neurosurgery []  Orthopedic Surgery  []  Heme/Onc  []  General Surgery []  Psychiatry                                   []  Palliative       SUBJECTIVE     No acute events overnight. Seen on trio rounding. Reviewed MRI C-spine with and without contrast which was negative for any acute abnormality    MEDICATIONS     Current Facility-Administered Medications   Medication Dose Route Frequency    amphetamine-dextroamphetamine  10 mg  Oral BID    busPIRone  5 mg Oral BID    heparin (porcine)  5,000 Units Subcutaneous Q8H SCH    lithium  300 mg Oral TID    senna-docusate  1 tablet Oral QHS     PHYSICAL EXAM     Vitals:    03/16/23 1522   BP: 123/69   Pulse: 77   Resp: 19   Temp: 98.6 F (37 C)   SpO2: 95%       Temperature: Temp  Min: 97.9 F (36.6 C)  Max: 99 F (37.2 C)  Pulse: Pulse  Min: 73  Max: 110  Respiratory: Resp  Min: 15  Max: 19  Non-Invasive BP: BP  Min: 98/65  Max: 134/80  Pulse Oximetry SpO2  Min: 94 %  Max: 97 %    Intake and Output Summary (Last 24 hours) at Date Time    Intake/Output Summary (Last 24 hours) at 03/16/2023 1559  Last data filed at 03/16/2023 1321  Gross per 24 hour   Intake 672 ml   Output --   Net 672 ml     GEN APPEARANCE: Patient resting in bed in NAD.  HEENT: No scleral icterus, MMM  CVS: RRR, S1, S2; No M/G/R  LUNGS: CTAB; No Wheezes; No Rhonchi: No rales  ABD: Soft; No TTP; + Normoactive  BS, no rebound or guarding  EXT: WWP, patient did appear to respond to pain to nailbed on left lower extremity  Skin exam:  No gross lesions noted on exposed skin surfaces  MENTAL STATUS:  Answers questions appropriately, responds to commands    LABS     Recent Labs   Lab 03/14/23  0312 03/13/23  0317 03/12/23  0359   WBC 9.70* 8.55  8.60 9.07   RBC 5.37 5.06  5.25 5.24   Hgb 15.0 13.7  14.3 14.5   Hematocrit 42.2 40.5  41.5 41.6   MCV 78.6 80.0  79.0 79.4   Platelets 354* 326  349* 337       Recent Labs   Lab 03/16/23  0347 03/15/23  0320 03/14/23  0312 03/13/23  0317 03/12/23  0446 03/12/23  0359 03/11/23  0310   Sodium  --   --  140 141  142 141 147* 141   Potassium  --   --  4.1 4.0  4.0 4.1 1.9* 4.2   Chloride  --   --  111 111  112* 110 133* 106   CO2  --   --  23 24  23 22  10* 24   BUN  --   --  14.0 16.0  16.0 16.0 9.0 14.0   Creatinine  --   --  0.9 0.8  0.8 0.8 0.4* 1.1   Glucose  --   --  98 102*  101* 108* 54* 102*   Calcium  --   --  9.4 9.0  9.0 9.1 4.0* 9.6   Magnesium 2.2 2.2 2.1 2.1  --  1.8 2.3     Recent Labs   Lab 03/14/23  0312 03/13/23  0317 03/12/23  0359 03/11/23  0310 03/10/23  1732   ALT 43 41  --   --  65*   AST (SGOT) 19 16  --   --  28   Bilirubin, Total 0.3 0.3  --   --  0.6   Albumin 3.9 3.7  3.6 1.6*  More results in Results Review 4.2   Alkaline Phosphatase 74 72  --   --  72   More results in Results Review = values in this interval not displayed.     Recent Labs   Lab 03/11/23  0310 03/10/23  1732   hs Troponin-I <2.7 <2.7   hs Troponin-I Delta  --  Unable toCalc.     Recent Labs   Lab 03/11/23  0310 03/10/23  1429   PT INR 1.1 1.0   PT 12.6 12.1   PTT 34 34     Microbiology Results (last 15 days)       Procedure Component Value Units Date/Time    COVID-19 (SARS-CoV-2) and Influenza A/B, NAA (Liat Rapid)- Admission [[id number]] Collected: 03/11/23 0729    Order Status: Completed Specimen: Culturette from Nasopharyngeal Updated: 03/11/23 0814     Purpose of COVID  testing Diagnostic -PUI     SARS-CoV-2 Specimen Source Nasal Swab     SARS CoV 2 Overall Result Not Detected     Comment: __________________________________________________  -A result of "Detected" indicates POSITIVE for the    presence of SARS CoV-2 RNA  -A result of "Not Detected" indicates NEGATIVE for the    presence of SARS CoV-2 RNA  __________________________________________________________  Test performed using the Roche cobas Liat SARS-CoV-2 assay. This assay is  only for use under the Food and Drug Administration's Emergency Use  Authorization. This is a real-time RT-PCR assay for the qualitative  detection of SARS-CoV-2 RNA. Viral nucleic acids may persist in vivo,  independent of viability. Detection of viral nucleic acid does not imply the  presence of infectious virus, or that virus nucleic acid is the cause of  clinical symptoms. Negative results do not preclude SARS-CoV-2 infection and  should not be used as the sole basis for diagnosis, treatment or other  patient management decisions. Negative results must be combined with  clinical observations, patient history, and/or epidemiological information.  Invalid results may be due to inhibiting substances in the specimen and  recollection should occur. Please see Fact Sheets for patients and providers  located:  WirelessDSLBlog.no          Influenza A Not Detected     Influenza B Not Detected     Comment: Test performed using the Roche cobas Liat SARS-CoV-2 & Influenza A/B assay.  This assay is only for use under the Food and Drug Administration's  Emergency Use Authorization. This is a multiplex real-time RT-PCR assay  intended for the simultaneous in vitro qualitative detection and  differentiation of SARS-CoV-2, influenza A, and influenza B virus RNA. Viral  nucleic acids may persist in vivo, independent of viability. Detection of  viral nucleic acid does not imply the presence of infectious virus, or that  virus nucleic  acid is the cause of clinical symptoms. Negative results do  not preclude SARS-CoV-2, influenza A, and/or influenza B infection and  should not be used as the sole basis for diagnosis, treatment or other  patient management decisions. Negative results must be combined with  clinical observations, patient history, and/or epidemiological information.  Invalid results may be due to inhibiting substances in the specimen and  recollection should occur. Please see Fact Sheets for patients and providers  located: http://www.rice.biz/.         Narrative:      o Collect and clearly label specimen type:  o PREFERRED-Upper respiratory specimen: One Nasal Swab in  Transport Media.  o Hand deliver to laboratory ASAP  Diagnostic -PUI             RADIOLOGY  MRI Cervical Spine W WO Contrast    Result Date: 03/15/2023  1. No acute abnormality identified. The cervical spinal cord signal and caliber is maintained. 2. Cervical spondylosis as described above. Neldon Mc, MD 03/15/2023 8:35 AM    CT Head WO Contrast    Result Date: 03/11/2023   Stable unremarkable appearance of the brain. No bleed or appreciable acute infarct. Trilby Drummer, MD 03/11/2023 2:13 PM    US Venous Low Extrem Duplx Dopp Comp Bilat    Result Date: 03/11/2023   No sonographic evidence for right or left lower extremity deep venous thrombosis. Marty Heck, MD 03/11/2023 9:20 AM    MR Angiogram Head WO Contrast    Result Date: 03/10/2023  1.There is no stenosis of the proximal right internal carotid artery based on NASCET criteria.  2.There is no stenosis of the proximal left internal carotid artery based on NASCET criteria. 3.The vertebrobasilar arterial system is patent. 4.Normal intracranial MR angiogram. Terrilee Croak, MD 03/10/2023 10:10 PM    MR Angiogram Neck W WO Contrast    Result Date: 03/10/2023  1.There is no stenosis of the proximal right internal carotid artery based on NASCET criteria.  2.There is no stenosis of the proximal left  internal carotid artery based on NASCET criteria. 3.The vertebrobasilar arterial system is patent. 4.Normal intracranial MR angiogram. Terrilee Croak, MD 03/10/2023 10:10 PM    MRI Brain W WO Contrast    Result Date: 03/10/2023   1.Small foci of T2 prolongation in the anterior left subinsular region also with minor gliosis related to a prior ischemic, infectious inflammatory process. 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 Terrilee Croak, MD 03/10/2023 10:05 PM    CT Head WO Contrast    Result Date: 03/10/2023   1.Normal examination. Terrilee Croak, MD 03/10/2023 6:58 PM    CT Angiogram Head Neck    Result Date: 03/10/2023  1.rCBF<30% images show 0 cc. Tmax>6sec images show 0 cc. 2.The intracranial arteries are patent, no proximal intracranial large vessel occlusion. 3.There is no stenosis of the proximal right internal carotid artery based on NASCET criteria. 4.There is no stenosis of the proximal left internal carotid artery based on NASCET criteria.  Alex Einar Pheasant, MD 03/10/2023 4:04 PM    CT Angiogram Cerebral Perfusion W 3D Reconstruction    Result Date: 03/10/2023  1.rCBF<30% images show 0 cc. Tmax>6sec images show 0 cc. 2.The intracranial arteries are patent, no proximal intracranial large vessel occlusion. 3.There is no stenosis of the proximal right internal carotid artery based on NASCET criteria. 4.There is no stenosis of the proximal left internal carotid artery based on NASCET criteria.  Alex Einar Pheasant, MD 03/10/2023 4:04 PM    CT Head without Contrast    Result Date: 03/10/2023   No acute intracranial abnormality. Georgann Housekeeper, MD 03/10/2023 2:00 PM       Signed,  Pilar Plate, NP  3:59 PM 03/16/2023

## 2023-03-16 NOTE — Progress Notes (Addendum)
See previous CM/ MD note. [Peer to peer done.  Patient was denied AR. CM made SNF referrals on all scripts, and updated patient of above information. Patient voiced that he will be contacting his insurance. CM  provided SNF list to patient and will f/u with patient in an hr to obtain top three preferences.    11:20 am : CM f/u with patient to confirm preferences.    [facility]-   [place]    Patient voiced that he is open to other facilities in [place] area if above facilities unable to accept.    12:30 pm : [facility] to initiate SNF auth today per admissions coordinator.      2:00 pm : SNF auth pending  per admissions.  CM updated patient.  Pending auth approval, patient requested to be transported via MMT transport and was made aware that insurance might not fully cover ambulance transport. Patient made aware of medical clearance.

## 2023-03-17 LAB — BASIC METABOLIC PANEL
Anion Gap: 9 (ref 5.0–15.0)
BUN: 21 mg/dL (ref 9.0–28.0)
CO2: 22 mEq/L (ref 17–29)
Calcium: 9.4 mg/dL (ref 8.5–10.5)
Chloride: 110 mEq/L (ref 99–111)
Creatinine: 0.9 mg/dL (ref 0.5–1.5)
Glucose: 98 mg/dL (ref 70–100)
Potassium: 4.1 mEq/L (ref 3.5–5.3)
Sodium: 141 mEq/L (ref 135–145)
eGFR: >60 mL/min/{1.73_m2} (ref >=60)

## 2023-03-17 LAB — CBC
Absolute NRBC: 0 10*3/uL (ref 0.00–0.00)
Hematocrit: 42.3 % (ref 37.6–49.6)
Hgb: 15.2 g/dL (ref 12.5–17.1)
MCH: 27.9 pg (ref 25.1–33.5)
MCHC: 35.9 g/dL — ABNORMAL HIGH (ref 31.5–35.8)
MCV: 77.8 fL — ABNORMAL LOW (ref 78.0–96.0)
MPV: 9.2 fL (ref 8.9–12.5)
Nucleated RBC: 0 /100 WBC (ref 0.0–0.0)
Platelets: 339 10*3/uL (ref 142–346)
RBC: 5.44 10*6/uL (ref 4.20–5.90)
RDW: 12 % (ref 11–15)
WBC: 9.29 10*3/uL (ref 3.10–9.50)

## 2023-03-17 LAB — MAGNESIUM: Magnesium: 2.1 mg/dL (ref 1.6–2.6)

## 2023-03-17 MED ORDER — QUETIAPINE FUMARATE 25 MG PO TABS
25.0000 mg | ORAL_TABLET | Freq: Every evening | ORAL | 0 refills | Status: AC | PRN
Start: 2023-03-17 — End: ?

## 2023-03-17 MED ORDER — SENNOSIDES-DOCUSATE SODIUM 8.6-50 MG PO TABS
1.0000 | ORAL_TABLET | Freq: Every evening | ORAL | Status: AC | PRN
Start: 2023-03-17 — End: ?

## 2023-03-17 NOTE — Progress Notes (Signed)
See previous CM note. SNF auth pending for [facility] per admissions-

## 2023-03-17 NOTE — Progress Notes (Addendum)
SNF auth approved for [employer] [facility] [employer]. [address] [place] room 119 A- RN report # [phone]. As requested by patient, [facility] stretcher transport set up for 12:00 pm.  CM confirmed with patient. Team updated via secure chat.  D/C summary/AVS placed in D/C envelope.     03/17/23 8295   Discharge Disposition   Patient preference/choice provided? Yes   Physical Discharge Disposition SNF   Was patient sent with auth pending to the post-acute facility? No   Receiving facility, unit and room number: [facility]  Room 119 A-   Nursing report phone number: [phone]   Mode of Transportation Ambulance   Pick up time ETA 12:00 pm   Patient/Family/POA notified of transfer plan Yes   Patient agreeable to discharge plan/expected d/c date? Yes   CM Interventions   Multidisciplinary rounds/family meeting before d/c? Yes   Medicare Checklist   Is this a Medicare patient? No

## 2023-03-17 NOTE — Discharge Summary (Signed)
[facility]      Patient: Kurt Rogers Admission Date: 03/10/2023   DOB: January 26, 1991 Discharge Date: 03/17/2023    MRN: [id number] Discharge Attending:Dr. Adin Hector, NP     Referring Physician: Octavia Bruckner, MD  PCP: Octavia Bruckner, MD       DISCHARGE SUMMARY     Discharge Information   Admission Diagnosis:   Left-sided weakness    Discharge Diagnosis:   Active Hospital Problems    Diagnosis    Left-sided weakness  Anxiety      Admission Condition: Guarded  Discharge Condition: Stable  Consultants: Psychiatrist, neurology  Functional Status: Ambulate with assistance  Discharged to: Rehab/SNF       Hospital Course   Presentation History: See HPI for details.    Hospital Course (60 Days)   32 year old male with history of anxiety/depression, mood disorder, ADHD who presented for evaluation of eft-sided weakness/tremor status post TNK. As noted, patient had similar event 1 year ago. No acute neurological findings on MRI that correlate with an acute stroke. Unclear the etiology or if there is an organic cause. Does have slight weakness in the left upper, and more pronounced in the left lower extremity.  Patient had MRI brain, MRA head and neck that were unremarkable, labs including lipid panel, hemoglobin A1c and TSH are also unremarkable. He was seen and cleared by neurology, also seen and cleared by psychiatrist with recommendation to continue home medications and outpatient follow-up. Patient remains with left-sided weakness, seen by PT/OT with recommendations for rehab.  Patient is feeling much better today, no other complaints and amenable to being discharged to SNF.  Please see H&P, consults and progress notes for detailed information.           Progress Note/Physical Exam at Discharge     Subjective:Patient is stable for discharge.    Vitals:    03/16/23 1934 03/16/23 2333 03/17/23 0438 03/17/23 0710   BP: 122/78 135/72 106/63 110/70   Pulse: 87 65 69 67   Resp: 18 18 18 17    Temp: 98.2 F  (36.8 C) 98.6 F (37 C) 98.2 F (36.8 C) 97.5 F (36.4 C)   TempSrc: Oral Oral Oral Oral   SpO2: 97% 97% 97% 97%   Weight:       Height:         General: NAD, AAOx3  HEENT: Sclera anicteric, no conjunctival injection, OP: Clear, MMM  Neck: Supple, no JVD  Cardiovascular: RRR, no m/r/g  Lungs: CTAB, no w/r/r  Abdomen: Soft, +BS, NT/ND, no masses, no g/r  Extremities: No C/C/E, left upper and lower extremity weakness, lower extremity worse than left upper extremity  Skin: No rashes or lesions noted  Neuro: No Focal neurological deficits       Diagnostics     Labs/Studies Pending at Discharge: No    Last Labs   Recent Labs   Lab 03/17/23  0442 03/14/23  0312 03/13/23  0317   WBC 9.29 9.70* 8.55  8.60   RBC 5.44 5.37 5.06  5.25   Hgb 15.2 15.0 13.7  14.3   Hematocrit 42.3 42.2 40.5  41.5   MCV 77.8* 78.6 80.0  79.0   Platelets 339 354* 326  349*       Recent Labs   Lab 03/17/23  0442 03/16/23  0347 03/15/23  0320 03/14/23  0312 03/13/23  0317 03/12/23  0446 03/12/23  0359   Sodium 141  --   --  140 141  142 141 147*   Potassium 4.1  --   --  4.1 4.0  4.0 4.1 1.9*   Chloride 110  --   --  111 111  112* 110 133*   CO2 22  --   --  23 24  23 22  10*   BUN 21.0  --   --  14.0 16.0  16.0 16.0 9.0   Creatinine 0.9  --   --  0.9 0.8  0.8 0.8 0.4*   Glucose 98  --   --  98 102*  101* 108* 54*   Calcium 9.4  --   --  9.4 9.0  9.0 9.1 4.0*   Magnesium 2.1 2.2 2.2 2.1 2.1  --  1.8       Microbiology Results (last 15 days)       Procedure Component Value Units Date/Time    COVID-19 (SARS-CoV-2) and Influenza A/B, NAA (Liat Rapid)- Admission [[id number]] Collected: 03/11/23 0729    Order Status: Completed Specimen: Culturette from Nasopharyngeal Updated: 03/11/23 0814     Purpose of COVID testing Diagnostic -PUI     SARS-CoV-2 Specimen Source Nasal Swab     SARS CoV 2 Overall Result Not Detected     Comment: __________________________________________________  -A result of "Detected" indicates POSITIVE for the     presence of SARS CoV-2 RNA  -A result of "Not Detected" indicates NEGATIVE for the    presence of SARS CoV-2 RNA  __________________________________________________________  Test performed using the Roche cobas Liat SARS-CoV-2 assay. This assay is  only for use under the Food and Drug Administration's Emergency Use  Authorization. This is a real-time RT-PCR assay for the qualitative  detection of SARS-CoV-2 RNA. Viral nucleic acids may persist in vivo,  independent of viability. Detection of viral nucleic acid does not imply the  presence of infectious virus, or that virus nucleic acid is the cause of  clinical symptoms. Negative results do not preclude SARS-CoV-2 infection and  should not be used as the sole basis for diagnosis, treatment or other  patient management decisions. Negative results must be combined with  clinical observations, patient history, and/or epidemiological information.  Invalid results may be due to inhibiting substances in the specimen and  recollection should occur. Please see Fact Sheets for patients and providers  located:  WirelessDSLBlog.no          Influenza A Not Detected     Influenza B Not Detected     Comment: Test performed using the Roche cobas Liat SARS-CoV-2 & Influenza A/B assay.  This assay is only for use under the Food and Drug Administration's  Emergency Use Authorization. This is a multiplex real-time RT-PCR assay  intended for the simultaneous in vitro qualitative detection and  differentiation of SARS-CoV-2, influenza A, and influenza B virus RNA. Viral  nucleic acids may persist in vivo, independent of viability. Detection of  viral nucleic acid does not imply the presence of infectious virus, or that  virus nucleic acid is the cause of clinical symptoms. Negative results do  not preclude SARS-CoV-2, influenza A, and/or influenza B infection and  should not be used as the sole basis for diagnosis, treatment or other  patient management  decisions. Negative results must be combined with  clinical observations, patient history, and/or epidemiological information.  Invalid results may be due to inhibiting substances in the specimen and  recollection should occur. Please see Fact Sheets for patients and providers  located: http://www.rice.biz/.  Narrative:      o Collect and clearly label specimen type:  o PREFERRED-Upper respiratory specimen: One Nasal Swab in  [employer].  o Hand deliver to laboratory ASAP  Diagnostic -PUI             Patient Instructions   Discharge Diet: Regular  Discharge Activity: Outpatient follow-up with primary care physician after your discharge from SNF.  Also appointment to see psychiatry as recommended.    Follow Up Appointment:   Follow-up Information       Octavia Bruckner, MD. Schedule an appointment as soon as possible for a visit in 1 week(s).    Specialty: Internal Medicine  Why: Please make an appointmant after you're discharged from SNF  Contact information:  [address]  [place] [address]  [phone]               Elwyn Reach, MD. Schedule an appointment as soon as possible for a visit in 2 week(s).    Specialties: Psychiatry, Internal Medicine  Contact information:  [address]  Psychiatry  [place] [address]  [phone]                              Discharge Medications:     Medication List        START taking these medications      QUEtiapine 25 MG tablet  Commonly known as: SEROquel  Take 1 tablet (25 mg) by mouth nightly as needed (anxiety)     senna-docusate 8.6-50 MG per tablet  Commonly known as: PERICOLACE  Take 1 tablet by mouth nightly as needed for Constipation            CONTINUE taking these medications      albuterol sulfate HFA 108 (90 Base) MCG/ACT inhaler  Commonly known as: PROVENTIL  Inhale 2 puffs into the lungs every 4 (four) hours as needed for Shortness of Breath Dispense with spacer     amphetamine-dextroamphetamine 10 MG  tablet  Commonly known as: ADDERALL     busPIRone 5 MG tablet  Commonly known as: BUSPAR     lithium 300 MG CR tablet  Commonly known as: LITHOBID            STOP taking these medications      hydrOXYzine 25 MG tablet  Commonly known as: ATARAX               Where to Get Your Medications        Information about where to get these medications is not yet available    Ask your nurse or doctor about these medications  QUEtiapine 25 MG tablet  senna-docusate 8.6-50 MG per tablet          Time spent examining patient, discussing with patient/family regarding hospital course, chart review, reconciling medications and discharge planning: 45 minutes minutes.    SignedPilar Plate, NP    9:53 AM 03/17/2023

## 2023-03-17 NOTE — Discharge Instr - AVS First Page (Addendum)
Reason for your Hospital Admission:  Left sided weakness s/p thrombolysis  Mood disorder   Anxiety and depression      Instructions for after your discharge:   Please make an appointment to see your primary care physician post discharged from SNF    Please also follow up with Psychiatry as recommended    Please take your medications as recommended     Please return to the hospital if your symptoms worsen.

## 2023-03-17 NOTE — ED Provider Notes (Addendum)
EMERGENCY DEPARTMENT HISTORY AND PHYSICAL EXAM      Date: 03/10/2023  Patient Name: Kurt Rogers    History of Presenting Illness     Chief Complaint   Patient presents with    Tremors    Extremity Weakness       History Provided By: patient      History: Kurt Rogers is a 32 y.o. male who presents with sudden onset of LUE numbness, weakness and shaking that began while sitting at work suddenly 30 minutes prior to arrival.  Denies any weakness in his legs.  No vision changes.  No nausea or vomiting.  No fevers or chills.    PCP: Octavia Bruckner, MD      Current Facility-Administered Medications   Medication Dose Route Frequency Provider Last Rate Last Admin    amphetamine-dextroamphetamine (ADDERALL) tablet 10 mg  10 mg Oral BID Pyers, Elonda Husky, NP   10 mg at 03/17/23 0939    busPIRone (BUSPAR) tablet 5 mg  5 mg Oral BID Pyers, Elonda Husky, NP   5 mg at 03/17/23 0939    calcium gluconate 1 g in 50 mL premix  1 g Intravenous PRN Pyers, Elonda Husky, NP        diphenhydrAMINE (BENADRYL) capsule 25 mg  25 mg Oral Q6H PRN Perrington, Tremecca E, PA        heparin (porcine) injection 5,000 Units  5,000 Units Subcutaneous [facility] Pyers, Elonda Husky, NP   5,000 Units at 03/17/23 0606    hydrALAZINE (APRESOLINE) injection 10 mg  10 mg Intravenous Q3H PRN Pyers, Elonda Husky, NP        labetalol (NORMODYNE,TRANDATE) injection 10 mg  10 mg Intravenous Q15 Min PRN Pyers, Elonda Husky, NP        lithium (LITHOBID) CR tablet 300 mg  300 mg Oral TID Pyers, Elonda Husky, NP   300 mg at 03/17/23 0606    magnesium oxide (MAG-OX) tablet 400-800 mg  400-800 mg Oral PRN Pyers, Elonda Husky, NP        Or    magnesium sulfate 1g in dextrose 5% IVPB (premix)  1 g Intravenous PRN Pyers, Elonda Husky, NP        potassium chloride (KLOR-CON M20) CR tablet 0-60 mEq  0-60 mEq Oral PRN Pyers, Elonda Husky, NP        Or    potassium chloride (KLOR-CON) packet 0-60 mEq  0-60 mEq Oral PRN Pyers, Elonda Husky, NP        Or    potassium chloride 10 mEq in 100 mL IVPB (premix)   10 mEq Intravenous PRN Pyers, Elonda Husky, NP        QUEtiapine (SEROquel) tablet 25 mg  25 mg Oral QHS PRN Pyers, Elonda Husky, NP   25 mg at 03/16/23 2157    senna-docusate (PERICOLACE) 8.6-50 MG per tablet 1 tablet  1 tablet Oral QHS Pyers, Elonda Husky, NP   1 tablet at 03/15/23 2124    sodium phosphates 15 mmol in dextrose 5 % 250 mL IVPB  15 mmol Intravenous PRN Pyers, Elonda Husky, NP        sodium phosphates 25 mmol in dextrose 5 % 250 mL IVPB  25 mmol Intravenous PRN Pyers, Elonda Husky, NP        sodium phosphates 35 mmol in dextrose 5 % 250 mL IVPB  35 mmol Intravenous PRN Pyers, Elonda Husky, NP  Past History     Past Medical History:  Past Medical History:   Diagnosis Date    Abnormal vision     glasses    Anxiety     Attention deficit hyperactivity disorder (ADHD)     Depression     Difficulty walking     uses cane at times    Generalized weakness 2014    in left leg since 2014    Mitral valve prolapse     Neurological abnormality     Non-rheumatic mitral valve disease     Numbness     TIA (transient ischemic attack)     in 2021 per PT       Past Surgical History:  Past Surgical History:   Procedure Laterality Date    RECONSTRUCTION, FACIAL  2014    left side near eye brow    RECONSTRUCTION, MYOCUTANEOUS FLAP  02/17/2023    Procedure: RECONSTRUCTION, FASCIOCUTANEOUS FLAP, LEFT LEG;  Surgeon: Lorene Dy, DO;  Location: [facility];  Service: General;;    RELEASE, LOWER EXTREMITY CONTRACTURE  02/17/2023    Procedure: RELEASE, LOWER EXTREMITY CONTRACTURE;  Surgeon: Lorene Dy, DO;  Location: [facility];  Service: General;;    REPAIR, FASCIAL DEFECT Left 02/17/2023    Procedure: REPAIR, FASCIAL DEFECT LEFT LEG;  Surgeon: Lorene Dy, DO;  Location: [facility];  Service: General;  Laterality: Left;    TONSILLECTOMY      as child       Family History:  Family History   Problem Relation Age of Onset    Malignant hyperthermia Neg Hx     Anesthesia problems Neg Hx        Social History:  Social History     Tobacco Use     Smoking status: Never     Passive exposure: Never    Smokeless tobacco: Never   Vaping Use    Vaping status: Never Used   Substance Use Topics    Alcohol use: Yes     Comment: once monthly per PT    Drug use: No     Comment: marijuana 1-2x a month (edibles)       Allergies:  Allergies   Allergen Reactions    Shellfish-Derived Products Anaphylaxis    Latex Rash     And swelling       Physical Exam   BP 110/70   Pulse 67   Temp 97.5 F (36.4 C) (Oral)   Resp 17   Ht 5\' 10"  (1.778 m)   Wt 111.2 kg   SpO2 97%   BMI 35.17 kg/m     CONSTITUTIONAL: Well-developed, well-nourished. Alert, cooperative, NAD. Vital signs reviewed. Nursing notes reviewed.  HEAD: Normocephalic, atraumatic.   EYES: Normal Inspection; Sclera and conjunctiva are normal without injection.  No discharge. PERRL, EOMs intact.  ENT: External inspection of nose and ears normal.  Trachea midline.  Mucous membranes moist.    NECK: Supple, full ROM without pain. No lymphadenopathy.   RESPIRATORY: Good air movement bilaterally, lungs CTA all fields.  No respiratory distress.   CARDIAC: RRR. Normal S1 and S2 without M/R/G.  No peripheral edema or cyanosis.  GI: Soft and non-tender throughout. No rebound or guarding.  BACK: Normal visual inspection. No pain on movement.   MS: No bony tenderness. Full ROM without pain. No edema, erythema.   SKIN: Warm, dry, and intact. No rash or lesions.  Inspected where exposed.  NEURO: A&O x 3.  Cranial nerves II through XII intact.  Mild tremor to the left upper extremity with slight drift as well.  Strength 5 out of 5 in bilateral lower extremities.  PSYCH: Normal affect, insight and concentration for age.    Diagnostic Study Results     Labs -     Results       Procedure Component Value Units Date/Time    Basic Metabolic Panel [[id number]] Collected: 03/17/23 0442    Specimen: Blood Updated: 03/17/23 0525     Glucose 98 mg/dL      BUN 40.9 mg/dL      Creatinine 0.9 mg/dL      Calcium 9.4 mg/dL      Sodium 811 mEq/L       Potassium 4.1 mEq/L      Chloride 110 mEq/L      CO2 22 mEq/L      Anion Gap 9.0     eGFR >60.0 mL/min/1.73 m2     Magnesium [[id number]] Collected: 03/17/23 0442    Specimen: Blood Updated: 03/17/23 0525     Magnesium 2.1 mg/dL     CBC without differential [[id number]]  (Abnormal) Collected: 03/17/23 0442    Specimen: Blood Updated: 03/17/23 0456     WBC 9.29 x10 3/uL      Hgb 15.2 g/dL      Hematocrit 46.9 %      Platelets 339 x10 3/uL      RBC 5.44 x10 6/uL      MCV 77.8 fL      MCH 27.9 pg      MCHC 35.9 g/dL      RDW 12 %      MPV 9.2 fL      Nucleated RBC 0.0 /100 WBC      Absolute NRBC 0.00 x10 3/uL             Radiologic Studies -   Radiology Results (24 Hour)       ** No results found for the last 24 hours. **        .      Medical Decision Making   I am the first provider for this patient.    I reviewed the vital signs, available nursing notes, past medical history, past surgical history, family history and social history.    Vital Signs-Reviewed the patient's vital signs.   Patient Vitals for the past 12 hrs:   BP Temp Pulse Resp   03/17/23 0710 110/70 97.5 F (36.4 C) 67 17   03/17/23 0438 106/63 98.2 F (36.8 C) 69 18   03/16/23 2333 135/72 98.6 F (37 C) 65 18       Pulse Oximetry Analysis - Normal 97% on RA    Cardiac Monitor:   Rate: nl   Rhythm:  Normal Sinus Rhythm     EKG:  Interpreted by myself.   Time Interpreted: 1405   Rate: 90 bpm   Rhythm: Normal Sinus Rhythm    Interpretation: Normal sinus rhythm with a rate of 90 bpm.  Normal axis and intervals.  No acute ST or T wave changes.  No significant changes compared to previous EKG dated 19 February 2023.    ED Course:   Patient is a 32 y.o./male that presented with a complaint of LUE weakness/numbness.  Initial vital signs were within normal limits.  On physical exam patient does not appear to be in any acute distress.  Heart regular rate and rhythm and lungs clear to auscultation bilaterally.  Does have left upper extremity drift with a  tremor.  NIH: 3. The differentials, vitals, patient risk factors, and HPI were considered and the above exam and work-up was performed.  Differentials include CVA, complex migraine, TIA, dehydration, electrolyte derangements.      Stroke alert was called and I immediately evaluated patient in the CT scanner with the above physical exam findings.  Consulted neurology, Dr. Moise Boring, immediately.  He came down to evaluate the patient as well.  With patient's physical exam findings and within the time limit for TNK, even with a low NIH the decision was made to administer TNK.  Patient was comfortable with this plan.  He was monitored in the emergency department and then admitted to the ICU.    The exam and findings were discussed with the patient and he was comfortable with the plan to admit.    Diagnosis     Clinical Impression:   1. Left-sided weakness        _______________________________         Simonne Maffucci, DO  03/17/23 1112

## 2023-03-17 NOTE — Progress Notes (Signed)
Per bedside Rn, unable to  give report to [facility]. Cm notified admissions coordinator and provided nurse phone number  to call back.

## 2023-03-17 NOTE — Plan of Care (Signed)
NURSING SHIFT NOTE     Patient: Kurt Rogers  Day: 0      SHIFT EVENTS     Shift Narrative/Significant Events (PRN med administration, fall, RRT, etc.):     - pt alert and oriented x 4  - On room air  - Sch medication given   - Patient refused laxative Senna   - Prn Seroquel given    Safety and fall precautions remain in place. Purposeful rounding completed.          ASSESSMENT     Changes in assessment from patient's baseline this shift:    Neuro: No  CV: No  Pulm: No  Peripheral Vascular: No  HEENT: No  GI: No  BM during shift: No, Last BM Date: 03/16/23  GU: No   Integ: No  MS: No    Pain: None  Pain Interventions: Rest  Medications Utilized:  None     Mobility: PMP Activity: Step 6 - Walks in Room of [employer] (ft) (Step 6,7): 20 Feet (with Min A with FWW)           Lines     Patient Lines/Drains/Airways Status       Active Lines, Drains and Airways       Name Placement date Placement time Site Days    Peripheral IV 03/10/23 20 G Left Antecubital 03/10/23  1337  Antecubital  6    Peripheral IV 03/10/23 18 G Diffusion Anterior;Distal;Right Antecubital 03/10/23  1355  Antecubital  6                         VITAL SIGNS     Vitals:    03/17/23 0438   BP: 106/63   Pulse: 69   Resp: 18   Temp: 98.2 F (36.8 C)   SpO2: 97%       Temp  Min: 98.2 F (36.8 C)  Max: 98.6 F (37 C)  Pulse  Min: 65  Max: 87  Resp  Min: 18  Max: 19  BP  Min: 102/68  Max: 135/72  SpO2  Min: 95 %  Max: 97 %      Intake/Output Summary (Last 24 hours) at 03/17/2023 0534  Last data filed at 03/16/2023 1934  Gross per 24 hour   Intake 772 ml   Output 350 ml   Net 422 ml            CARE PLAN          Problem: Compromised Activity/Mobility  Goal: Activity/Mobility Interventions  Outcome: Progressing  Flowsheets (Taken 03/17/2023 0402)  Activity/Mobility Interventions: Pad bony prominences, TAP Seated positioning system when OOB, Promote PMP, Reposition q 2 hrs / turn clock, Offload heels     Problem: Every Day - Stroke  Goal:  Neurological status is stable or improving  Outcome: Progressing  Goal: Stable vital signs and fluid balance  Outcome: Progressing     Problem: Side Effects from Pain Analgesia  Goal: Patient will experience minimal side effects of analgesic therapy  Outcome: Progressing     Problem: Safety  Goal: Patient will be free from injury during hospitalization  Outcome: Progressing  Flowsheets (Taken 03/17/2023 0402)  Patient will be free from injury during hospitalization:   Assess patient's risk for falls and implement fall prevention plan of care per policy   Provide and maintain safe environment   Use appropriate transfer methods   Ensure appropriate safety devices are available at the  bedside   Include patient/ family/ care giver in decisions related to safety     Problem: Psychosocial and Spiritual Needs  Goal: Demonstrates ability to cope with hospitalization/illness  Outcome: Progressing     Problem: Moderate/High Fall Risk Score >5  Goal: Patient will remain free of falls  Flowsheets (Taken 03/16/2023 1131 by Kathrynn Ducking, RN)  Moderate Risk (6-13):   MOD-Floor mat at bedside (where available) if appropriate   MOD-Consider activation of bed alarm if appropriate   MOD-Remain with patient during toileting   MOD-Perform dangle, stand, walk (DSW) prior to mobilization   MOD-Use gait belt when appropriate   MOD-Consider a move closer to Nurses Station   MOD-Place bedside commode and assistive devices out of sight when not in use   MOD-Request PT/OT consult order for patients with gait/mobility impairment

## 2023-03-17 NOTE — Plan of Care (Addendum)
Patient: Kurt Rogers  Day: 0      SHIFT EVENTS     Shift Narrative/Significant Events (PRN med administration, fall, RRT, etc.):   Patient is alert and oriented x 4. On room air. VSS. Scheduled medications administered, pt tolerated well. On Neuro checks, pt denied pain during this shift. Discharge teaching done, pt verbalized understanding. Nurse called [facility] at [phone], several times to report to the intake nurse without response. Pt left the unit with all his belongings, transported by [facility] transportation.       Problem: Moderate/High Fall Risk Score >5  Goal: Patient will remain free of falls  Flowsheets (Taken 03/17/2023 1018)  Moderate Risk (6-13):   MOD-Apply bed exit alarm if patient is confused   MOD-Consider a move closer to [employer]   MOD-Request PT/OT consult order for patients with gait/mobility impairment   MOD-include family in multidisciplinary POC discussions   MOD-Consider activation of bed alarm if appropriate   MOD-Floor mat at bedside (where available) if appropriate   MOD-Remain with patient during toileting   MOD-Perform dangle, stand, walk (DSW) prior to mobilization   MOD-Use gait belt when appropriate     Problem: Compromised Sensory Perception  Goal: Sensory Perception Interventions  Flowsheets (Taken 03/17/2023 0748)  Sensory Perception Interventions: Offload heels, Pad bony prominences, Reposition q 2hrs/turn Clock, Q2 hour skin assessment under devices if present     Problem: Compromised Activity/Mobility  Goal: Activity/Mobility Interventions  Flowsheets (Taken 03/17/2023 0748)  Activity/Mobility Interventions: Pad bony prominences, TAP Seated positioning system when OOB, Promote PMP, Reposition q 2 hrs / turn clock, Offload heels     Problem: Compromised Nutrition  Goal: Nutrition Interventions  Flowsheets (Taken 03/17/2023 0748)  Nutrition Interventions: Discuss nutrition at Rounds, I&Os, Document % meal eaten, Daily weights     Problem: Compromised  Moisture  Goal: Moisture level Interventions  Flowsheets (Taken 03/17/2023 0748)  Moisture level Interventions: Moisture wicking products, Moisture barrier cream     Problem: Compromised Friction/Shear  Goal: Friction and Shear Interventions  Flowsheets (Taken 03/17/2023 0748)  Friction and Shear Interventions: Pad bony prominences, Off load heels, HOB 30 degrees or less unless contraindicated, Consider: TAP seated positioning, Heel foams     NURSING SHIFT NOTE     Patient: Kurt Rogers  Day: 0      SHIFT EVENTS     Shift Narrative/Significant Events (PRN med administration, fall, RRT, etc.):   Patient is alert and oriented x 4. On room air. VSS. Scheduled medications administered, pt tolerated well. On Neuro checks, pt denied pain during this shift. Discharge teaching done, pt verbalized understanding.         ASSESSMENT     Changes in assessment from patient's baseline this shift:    Neuro:   CV:   Pulm:   Peripheral Vascular:   HEENT:   GI:   BM during shift: , Last BM: Last BM Date: 03/16/23  GU:    Integ:   MS:     Pain:   Pain Interventions:   Medications Utilized:     Mobility: PMP Activity: Step 6 - Walks in Room of Distance Walked (ft) (Step 6,7): 20 Feet (with Min A with FWW)           Lines     Patient Lines/Drains/Airways Status       Active Lines, Drains and Airways       Name Placement date Placement time Site Days    Peripheral IV 03/10/23  20 G Left Antecubital 03/10/23  1337  Antecubital  6    Peripheral IV 03/10/23 18 G Diffusion Anterior;Distal;Right Antecubital 03/10/23  1355  Antecubital  6                         VITAL SIGNS

## 2023-03-23 ENCOUNTER — Encounter (FREE_STANDING_LABORATORY_FACILITY): Payer: No Typology Code available for payment source

## 2023-03-23 DIAGNOSIS — I10 Essential (primary) hypertension: Secondary | ICD-10-CM

## 2023-03-23 LAB — CBC
Absolute NRBC: 0 10*3/uL (ref 0.00–0.00)
Hematocrit: 41.2 % (ref 37.6–49.6)
Hgb: 14.7 g/dL (ref 12.5–17.1)
MCH: 27.9 pg (ref 25.1–33.5)
MCHC: 35.7 g/dL (ref 31.5–35.8)
MCV: 78.3 fL (ref 78.0–96.0)
MPV: 9.6 fL (ref 8.9–12.5)
Nucleated RBC: 0 /100 WBC (ref 0.0–0.0)
Platelets: 304 10*3/uL (ref 142–346)
RBC: 5.26 10*6/uL (ref 4.20–5.90)
RDW: 12 % (ref 11–15)
WBC: 9.05 10*3/uL (ref 3.10–9.50)

## 2023-03-23 LAB — BASIC METABOLIC PANEL
Anion Gap: 9 (ref 5.0–15.0)
BUN: 16 mg/dL (ref 9.0–28.0)
CO2: 22 mEq/L (ref 17–29)
Calcium: 9.4 mg/dL (ref 8.5–10.5)
Chloride: 110 mEq/L (ref 99–111)
Creatinine: 0.9 mg/dL (ref 0.5–1.5)
Glucose: 95 mg/dL (ref 70–100)
Potassium: 3.9 mEq/L (ref 3.5–5.3)
Sodium: 141 mEq/L (ref 135–145)
eGFR: >60 mL/min/{1.73_m2} (ref >=60)

## 2023-03-23 LAB — HEMOLYSIS INDEX(SOFT): Hemolysis Index: 5 Index (ref 0–24)

## 2023-03-25 ENCOUNTER — Encounter (FREE_STANDING_LABORATORY_FACILITY): Payer: No Typology Code available for payment source

## 2023-03-25 DIAGNOSIS — I10 Essential (primary) hypertension: Secondary | ICD-10-CM

## 2023-03-25 LAB — CBC
Absolute NRBC: 0 10*3/uL (ref 0.00–0.00)
Hematocrit: 42.9 % (ref 37.6–49.6)
Hgb: 15.1 g/dL (ref 12.5–17.1)
MCH: 27.8 pg (ref 25.1–33.5)
MCHC: 35.2 g/dL (ref 31.5–35.8)
MCV: 79 fL (ref 78.0–96.0)
MPV: 9.9 fL (ref 8.9–12.5)
Nucleated RBC: 0 /100 WBC (ref 0.0–0.0)
Platelets: 337 10*3/uL (ref 142–346)
RBC: 5.43 10*6/uL (ref 4.20–5.90)
RDW: 12 % (ref 11–15)
WBC: 10.22 10*3/uL — ABNORMAL HIGH (ref 3.10–9.50)

## 2023-03-25 LAB — BASIC METABOLIC PANEL
Anion Gap: 9 (ref 5.0–15.0)
BUN: 15 mg/dL (ref 9.0–28.0)
CO2: 21 mEq/L (ref 17–29)
Calcium: 9.4 mg/dL (ref 8.5–10.5)
Chloride: 110 mEq/L (ref 99–111)
Creatinine: 0.9 mg/dL (ref 0.5–1.5)
Glucose: 94 mg/dL (ref 70–100)
Potassium: 4.2 mEq/L (ref 3.5–5.3)
Sodium: 140 mEq/L (ref 135–145)
eGFR: >60 mL/min/{1.73_m2} (ref >=60)

## 2023-03-25 LAB — HEMOLYSIS INDEX(SOFT): Hemolysis Index: 17 Index (ref 0–24)

## 2023-04-01 ENCOUNTER — Encounter (FREE_STANDING_LABORATORY_FACILITY): Payer: No Typology Code available for payment source

## 2023-04-01 DIAGNOSIS — I10 Essential (primary) hypertension: Secondary | ICD-10-CM

## 2023-04-01 LAB — BASIC METABOLIC PANEL
Anion Gap: 10 (ref 5.0–15.0)
BUN: 12 mg/dL (ref 9.0–28.0)
CO2: 22 mEq/L (ref 17–29)
Calcium: 9 mg/dL (ref 8.5–10.5)
Chloride: 110 mEq/L (ref 99–111)
Creatinine: 0.9 mg/dL (ref 0.5–1.5)
Glucose: 96 mg/dL (ref 70–100)
Potassium: 4.1 mEq/L (ref 3.5–5.3)
Sodium: 142 mEq/L (ref 135–145)
eGFR: >60 mL/min/{1.73_m2} (ref >=60)

## 2023-04-01 LAB — HEMOLYSIS INDEX(SOFT): Hemolysis Index: 11 Index (ref 0–24)

## 2023-04-01 LAB — CBC AND DIFFERENTIAL
Absolute NRBC: 0 10*3/uL (ref 0.00–0.00)
Basophils Absolute Automated: 0.07 10*3/uL (ref 0.00–0.08)
Basophils Automated: 0.8 %
Eosinophils Absolute Automated: 0.41 10*3/uL (ref 0.00–0.44)
Eosinophils Automated: 4.4 %
Hematocrit: 40.8 % (ref 37.6–49.6)
Hgb: 14.1 g/dL (ref 12.5–17.1)
Immature Granulocytes Absolute: 0.05 10*3/uL (ref 0.00–0.07)
Immature Granulocytes: 0.5 %
Instrument Absolute Neutrophil Count: 4.27 10*3/uL (ref 1.10–6.33)
Lymphocytes Absolute Automated: 3.8 10*3/uL — ABNORMAL HIGH (ref 0.42–3.22)
Lymphocytes Automated: 40.9 %
MCH: 27.1 pg (ref 25.1–33.5)
MCHC: 34.6 g/dL (ref 31.5–35.8)
MCV: 78.3 fL (ref 78.0–96.0)
MPV: 9.3 fL (ref 8.9–12.5)
Monocytes Absolute Automated: 0.69 10*3/uL (ref 0.21–0.85)
Monocytes: 7.4 %
Neutrophils Absolute: 4.27 10*3/uL (ref 1.10–6.33)
Neutrophils: 46 %
Nucleated RBC: 0 /100 WBC (ref 0.0–0.0)
Platelets: 380 10*3/uL — ABNORMAL HIGH (ref 142–346)
RBC: 5.21 10*6/uL (ref 4.20–5.90)
RDW: 12 % (ref 11–15)
WBC: 9.29 10*3/uL (ref 3.10–9.50)

## 2023-06-24 ENCOUNTER — Telehealth: Payer: Self-pay
